# Patient Record
Sex: Female | Born: 1975
Health system: Southern US, Community
[De-identification: ages and names within clinical notes are randomized; demographics above are authoritative.]

## PROBLEM LIST (undated history)

## (undated) ENCOUNTER — Ambulatory Visit: Admission: EM | Discharge status: Absent without leave | Payer: BC Managed Care – PPO

## (undated) DIAGNOSIS — F329 Major depressive disorder, single episode, unspecified: Secondary | ICD-10-CM

## (undated) DIAGNOSIS — E063 Autoimmune thyroiditis: Secondary | ICD-10-CM

## (undated) DIAGNOSIS — R112 Nausea with vomiting, unspecified: Secondary | ICD-10-CM

## (undated) DIAGNOSIS — I493 Ventricular premature depolarization: Secondary | ICD-10-CM

## (undated) DIAGNOSIS — D649 Anemia, unspecified: Secondary | ICD-10-CM

## (undated) DIAGNOSIS — F32A Depression, unspecified: Secondary | ICD-10-CM

## (undated) DIAGNOSIS — F419 Anxiety disorder, unspecified: Secondary | ICD-10-CM

## (undated) DIAGNOSIS — K602 Anal fissure, unspecified: Secondary | ICD-10-CM

## (undated) DIAGNOSIS — E039 Hypothyroidism, unspecified: Secondary | ICD-10-CM

## (undated) DIAGNOSIS — Z9889 Other specified postprocedural states: Secondary | ICD-10-CM

## (undated) HISTORY — DX: Depression, unspecified: F32.A

## (undated) HISTORY — DX: Anal fissure, unspecified: K60.2

## (undated) HISTORY — PX: MUSCLE BIOPSY: SHX716

## (undated) HISTORY — DX: Anemia, unspecified: D64.9

## (undated) HISTORY — DX: Anxiety disorder, unspecified: F41.9

## (undated) HISTORY — PX: WISDOM TOOTH EXTRACTION: SHX21

## (undated) HISTORY — PX: OTHER SURGICAL HISTORY: SHX169

## (undated) HISTORY — DX: Major depressive disorder, single episode, unspecified: F32.9

## (undated) HISTORY — DX: Hypothyroidism, unspecified: E03.9

---

## 2005-04-29 HISTORY — PX: CERVICAL DISCECTOMY: SHX98

## 2005-05-02 ENCOUNTER — Ambulatory Visit: Payer: Self-pay | Admitting: Family Medicine

## 2005-06-05 ENCOUNTER — Encounter: Payer: Self-pay | Admitting: Family Medicine

## 2005-08-15 ENCOUNTER — Ambulatory Visit (HOSPITAL_COMMUNITY): Admission: RE | Admit: 2005-08-15 | Discharge: 2005-08-16 | Payer: Self-pay | Admitting: Neurosurgery

## 2006-11-18 ENCOUNTER — Encounter: Payer: Self-pay | Admitting: Family Medicine

## 2007-05-31 LAB — CONVERTED CEMR LAB: Pap Smear: NORMAL

## 2007-09-15 ENCOUNTER — Ambulatory Visit (HOSPITAL_COMMUNITY): Admission: RE | Admit: 2007-09-15 | Discharge: 2007-09-15 | Payer: Self-pay | Admitting: *Deleted

## 2008-03-08 ENCOUNTER — Ambulatory Visit: Payer: Self-pay | Admitting: Family Medicine

## 2008-03-08 DIAGNOSIS — R635 Abnormal weight gain: Secondary | ICD-10-CM

## 2008-03-08 DIAGNOSIS — K429 Umbilical hernia without obstruction or gangrene: Secondary | ICD-10-CM | POA: Insufficient documentation

## 2008-03-08 DIAGNOSIS — G729 Myopathy, unspecified: Secondary | ICD-10-CM

## 2008-03-08 DIAGNOSIS — K648 Other hemorrhoids: Secondary | ICD-10-CM | POA: Insufficient documentation

## 2008-03-08 DIAGNOSIS — I4949 Other premature depolarization: Secondary | ICD-10-CM

## 2008-03-11 DIAGNOSIS — D72819 Decreased white blood cell count, unspecified: Secondary | ICD-10-CM | POA: Insufficient documentation

## 2008-03-11 LAB — CONVERTED CEMR LAB
BUN: 13 mg/dL (ref 6–23)
Basophils Relative: 0.8 % (ref 0.0–3.0)
Bilirubin, Direct: 0.2 mg/dL (ref 0.0–0.3)
CO2: 26 meq/L (ref 19–32)
Calcium: 9.2 mg/dL (ref 8.4–10.5)
Cholesterol: 150 mg/dL (ref 0–200)
GFR calc non Af Amer: 88 mL/min
Glucose, Bld: 75 mg/dL (ref 70–99)
HDL: 37.7 mg/dL — ABNORMAL LOW (ref >39.0)
MCHC: 35.4 g/dL (ref 30.0–36.0)
Monocytes Absolute: 0.3 10*3/uL (ref 0.1–1.0)
Neutro Abs: 2.5 10*3/uL (ref 1.4–7.7)
Neutrophils Relative %: 62.8 % (ref 43.0–77.0)
Potassium: 4.3 meq/L (ref 3.5–5.1)
RDW: 11.6 % (ref 11.5–14.6)
TSH: 4.14 microintl units/mL (ref 0.35–5.50)
Total CHOL/HDL Ratio: 4
WBC: 3.9 10*3/uL — ABNORMAL LOW (ref 4.5–10.5)

## 2008-03-22 ENCOUNTER — Ambulatory Visit: Payer: Self-pay | Admitting: Cardiology

## 2008-03-25 ENCOUNTER — Ambulatory Visit: Payer: Self-pay | Admitting: Family Medicine

## 2008-03-25 DIAGNOSIS — J019 Acute sinusitis, unspecified: Secondary | ICD-10-CM

## 2008-03-30 ENCOUNTER — Telehealth: Payer: Self-pay | Admitting: Family Medicine

## 2008-03-31 ENCOUNTER — Ambulatory Visit: Payer: Self-pay | Admitting: Family Medicine

## 2008-09-22 ENCOUNTER — Ambulatory Visit: Payer: Self-pay | Admitting: Family Medicine

## 2008-09-22 DIAGNOSIS — R5383 Other fatigue: Secondary | ICD-10-CM

## 2008-09-22 DIAGNOSIS — R5381 Other malaise: Secondary | ICD-10-CM | POA: Insufficient documentation

## 2008-09-27 LAB — CONVERTED CEMR LAB
Basophils Absolute: 0 10*3/uL (ref 0.0–0.1)
Basophils Relative: 0.1 % (ref 0.0–3.0)
HCT: 36.3 % (ref 36.0–46.0)
Hemoglobin: 12.8 g/dL (ref 12.0–15.0)
Lymphocytes Relative: 32 % (ref 12.0–46.0)
MCHC: 35.3 g/dL (ref 30.0–36.0)
Platelets: 154 10*3/uL (ref 150.0–400.0)
RBC: 3.82 M/uL — ABNORMAL LOW (ref 3.87–5.11)
RDW: 11.3 % — ABNORMAL LOW (ref 11.5–14.6)
WBC: 4.2 10*3/uL — ABNORMAL LOW (ref 4.5–10.5)

## 2009-02-17 ENCOUNTER — Telehealth (INDEPENDENT_AMBULATORY_CARE_PROVIDER_SITE_OTHER): Payer: Self-pay | Admitting: *Deleted

## 2009-04-10 ENCOUNTER — Ambulatory Visit: Payer: Self-pay | Admitting: Family Medicine

## 2009-04-29 HISTORY — PX: GANGLION CYST EXCISION: SHX1691

## 2009-06-27 ENCOUNTER — Inpatient Hospital Stay: Payer: Self-pay

## 2009-09-14 ENCOUNTER — Emergency Department: Payer: Self-pay | Admitting: Emergency Medicine

## 2009-11-15 ENCOUNTER — Ambulatory Visit: Payer: Self-pay | Admitting: Orthopedic Surgery

## 2009-11-20 LAB — PATHOLOGY REPORT

## 2010-05-24 ENCOUNTER — Ambulatory Visit
Admission: RE | Admit: 2010-05-24 | Discharge: 2010-05-24 | Payer: Self-pay | Source: Home / Self Care | Attending: Internal Medicine | Admitting: Internal Medicine

## 2010-05-24 DIAGNOSIS — E669 Obesity, unspecified: Secondary | ICD-10-CM | POA: Insufficient documentation

## 2010-05-24 DIAGNOSIS — E663 Overweight: Secondary | ICD-10-CM

## 2010-05-24 DIAGNOSIS — E039 Hypothyroidism, unspecified: Secondary | ICD-10-CM | POA: Insufficient documentation

## 2010-05-30 ENCOUNTER — Encounter: Payer: Self-pay | Admitting: Family Medicine

## 2010-05-31 NOTE — Assessment & Plan Note (Signed)
Summary: PERSONAL / LFW   Vital Signs:  Patient profile:   35 year old female Weight:      156 pounds BMI:     26.87 Temp:     98.2 degrees F Pulse rate:   52 / minute BP sitting:   101 / 58  Nutrition Counseling: Patient's BMI is greater than 25 and therefore counseled on weight management options. CC: Personal   History of Present Illness: Wanted to see me--- I  treat her husband  had their 3 rd child---their first son in March Diagnosed with hypothyroidism after having hair loss, fatigue and other classic symptoms was seeing Dr Dario Guardian for this Felt better on the 50 micrograms dose but apparently wasn't euthyroid so he increased to 75 micrograms Seems that she doesn't feel as good weight has started creeping up again  appetite is very big Back to her regular exercise Tries to eat well Husband watching his eating also so they are working together  Weight 120# before back surgery in 2008 then crept up to 140# Now unable to get back anywhere near there and is frustrated  Allergies: 1)  ! * Corn 2)  ! * Seasonal 3)  ! * Peanuts 4)  ! * Eggs  Past History:  Past medical, surgical, family and social histories (including risk factors) reviewed for relevance to current acute and chronic problems.  Past Medical History: Hypothyroidism  Past Surgical History: Reviewed history from 03/08/2008 and no changes required. 2007 partial diskectomy muscle biopsy asa teenager: nonspecific NSVD x 2  Family History: Reviewed history from 03/08/2008 and no changes required. father: hepatitis C, HTN mother: healthy sister: hypothyroidism brother: Crohn's, asthma PGF: DM, leukemia 3 aunts/uncles, maternal: epilepsy uncle : liver cancer  Social History: Reviewed history from 03/08/2008 and no changes required. 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: healthy Never Smoked Alcohol use-yes, rarely Drug use-no Regular exercise-yes, cardio 2 days a week, 3  classes a week Diet: fruits and veggies, doesn't like meat much, likes soda and sweet tea  Review of Systems  The patient denies chest pain, syncope, and dyspnea on exertion.         Occ palpitations and has been told she has PVCs Had gotten pregnant on OCP, then miscarried Then pregnant again with their son Husband has since had vasectomy sleeps okay  Physical Exam  General:  alert and normal appearance.   Neck:  supple, no masses, no thyromegaly, no carotid bruits, and no cervical lymphadenopathy.   Lungs:  normal respiratory effort, no intercostal retractions, no accessory muscle use, and normal breath sounds.   Heart:  regular rhythm, no murmur, no gallop, and bradycardia.   Abdomen:  soft and non-tender.   Extremities:  no edema Psych:  normally interactive, good eye contact, not anxious appearing, and not depressed appearing.     Impression & Recommendations:  Problem # 1:  HYPOTHYROIDISM (ICD-244.9) Assessment New  post partum last year had been seeing Dr Dario Guardian Felt better on the 50 micrograms dose Appetite more and weight seems to be rising on the higher dose  P: will get records from Dr Dario Guardian     try back on the 50 micrograms dose  Her updated medication list for this problem includes:    Levothyroxine Sodium 50 Mcg Tabs (Levothyroxine sodium) .Marland Kitchen... 1 tab daily for underactive thyroid  Problem # 2:  OVERWEIGHT (ICD-278.02) Assessment: Deteriorated had been ideal body weight until 2008 Okay when she was 140 also Needs some help  so will try phentermine short term discussed potential side effects  Complete Medication List: 1)  Levothyroxine Sodium 50 Mcg Tabs (Levothyroxine sodium) .Marland Kitchen.. 1 tab daily for underactive thyroid 2)  Phentermine Hcl 37.5 Mg Caps (Phentermine hcl) .Marland Kitchen.. 1 tab daily to suppress appetite  Patient Instructions: 1)  Please get records from Dr Dario Guardian 2)  Please schedule a follow-up appointment in 2 months.  Prescriptions: PHENTERMINE  HCL 37.5 MG CAPS (PHENTERMINE HCL) 1 tab daily to suppress appetite  #30 x 2   Entered and Authorized by:   Cindee Salt MD   Signed by:   Cindee Salt MD on 05/24/2010   Method used:   Print then Give to Patient   RxID:   [id number] LEVOTHYROXINE SODIUM 50 MCG TABS (LEVOTHYROXINE SODIUM) 1 tab daily for underactive thyroid  #30 x 11   Entered and Authorized by:   Cindee Salt MD   Signed by:   Cindee Salt MD on 05/24/2010   Method used:   Electronically to        [facility] (retail)       [address]       [place], [place]  [id number]       Ph: [phone]       Fax: [phone]   RxID:   [id number]    Orders Added: 1)  Est. Patient Level IV [87564]

## 2010-06-04 ENCOUNTER — Encounter: Payer: Self-pay | Admitting: Family Medicine

## 2010-06-04 ENCOUNTER — Ambulatory Visit (INDEPENDENT_AMBULATORY_CARE_PROVIDER_SITE_OTHER): Payer: Self-pay | Admitting: Family Medicine

## 2010-06-04 DIAGNOSIS — J019 Acute sinusitis, unspecified: Secondary | ICD-10-CM

## 2010-06-04 DIAGNOSIS — J309 Allergic rhinitis, unspecified: Secondary | ICD-10-CM

## 2010-06-04 DIAGNOSIS — J157 Pneumonia due to Mycoplasma pneumoniae: Secondary | ICD-10-CM

## 2010-06-04 DIAGNOSIS — E860 Dehydration: Secondary | ICD-10-CM

## 2010-06-06 ENCOUNTER — Ambulatory Visit (INDEPENDENT_AMBULATORY_CARE_PROVIDER_SITE_OTHER): Payer: BC Managed Care – PPO | Admitting: Internal Medicine

## 2010-06-06 ENCOUNTER — Ambulatory Visit: Payer: Self-pay | Admitting: Internal Medicine

## 2010-06-06 ENCOUNTER — Encounter: Payer: Self-pay | Admitting: Internal Medicine

## 2010-06-06 DIAGNOSIS — J111 Influenza due to unidentified influenza virus with other respiratory manifestations: Secondary | ICD-10-CM | POA: Insufficient documentation

## 2010-06-14 NOTE — Letter (Signed)
Summary: Work excuse letter  Work excuse letter   Imported By: Dorna Leitz 06/05/2010 19:20:55  _____________________________________________________________________  External Attachment:    Type:   Image     Comment:   External Document

## 2010-06-14 NOTE — Assessment & Plan Note (Signed)
Summary: SICK/EVM   Vital Signs:  Patient Profile:   35 Years Old Female CC:      cough, chills Height:     64 inches (162.56 cm) O2 Sat:      98 % O2 treatment:    Room Air Temp:     99.6 degrees F oral Pulse rate:   102 / minute Pulse rhythm:   regular Resp:     14 per minute BP sitting:   119 / 78  (right arm)  Pt. in pain?   no                   Current Allergies: ! * CORN ! * SEASONAL ! * PEANUTS ! * EGGSHistory of Present Illness History from: patient Reason for visit: see chief complaint Chief Complaint: cough, chills History of Present Illness: The Patient Is Presenting Today because she has been having 3-4 days of cough congestion and headache. She's had a dry hacking cough. She says that she has had fatigue and headache. She is coughing up a green yellow mucus from the chest. She reports that she recently traveled to [place] and was on 4 different airline flights that were packed with people that were coughing and hacking. The patient reports that she is now having fever and chills and cough. She reports that she's been sleeping throughout the day and not drinking or eating well. She reports that she's having no diarrhea or vomiting but has been nauseated. The patient reports that she has no abdominal pain but stomach upset is appropriate to call her symptoms. The patient reports that she was not able to work today because she was so tired and feeling so bad that she called in sick. She's requesting a note for today.  REVIEW OF SYSTEMS Constitutional Symptoms       Complains of fever, chills, and fatigue.     Denies night sweats, weight loss, and weight gain.  Eyes       Denies change in vision, eye pain, eye discharge, glasses, contact lenses, and eye surgery. Ear/Nose/Throat/Mouth       Complains of frequent runny nose, sinus problems, sore throat, and tooth pain or bleeding.      Denies hearing loss/aids, change in hearing, ear pain, ear discharge, dizziness,  frequent nose bleeds, and hoarseness.  Respiratory       Complains of dry cough.      Denies productive cough, wheezing, shortness of breath, asthma, bronchitis, and emphysema/COPD.  Cardiovascular       Denies murmurs, chest pain, and tires easily with exhertion.    Gastrointestinal       Complains of nausea/vomiting.      Denies stomach pain, diarrhea, constipation, blood in bowel movements, and indigestion. Genitourniary       Denies painful urination, kidney stones, and loss of urinary control. Neurological       Complains of headaches.      Denies paralysis, seizures, and fainting/blackouts. Musculoskeletal       Complains of muscle pain and joint pain.      Denies joint stiffness, decreased range of motion, redness, swelling, muscle weakness, and gout.  Skin       Denies bruising, unusual mles/lumps or sores, and hair/skin or nail changes.  Psych       Denies mood changes, temper/anger issues, anxiety/stress, speech problems, depression, and sleep problems.  Past History:  Past Surgical History: Reviewed history from 03/08/2008 and no changes required. 2007 partial diskectomy  muscle biopsy asa teenager: nonspecific NSVD x 2  Family History: Reviewed history from 03/08/2008 and no changes required. father: hepatitis C, HTN mother: healthy sister: hypothyroidism brother: Crohn's, asthma PGF: DM, leukemia 3 aunts/uncles, maternal: epilepsy uncle : liver cancer  Social History: Reviewed history from 05/24/2010 and no changes required. 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: healthy Never Smoked Alcohol use-yes, rarely Drug use-no Regular exercise-yes, cardio 2 days a week, 3 classes a week Diet: fruits and veggies, doesn't like meat much, likes soda and sweet tea Physical Exam General appearance: well developed, well nourished, no acute distress Head: normocephalic, atraumatic Eyes: conjunctivae and lids normal Pupils: equal, round, reactive to  light Ears: normal, no lesions or deformities Nasal: swollen nasal turbinates and marked sinus congestion and postnasal drainage Oral/Pharynx: dry mucous membranes, posterior pharynx erythematous and beefy red Neck: neck supple,  no lymphadenopathy, trachea midline, no masses Thyroid: no nodules, masses, tenderness, or enlargement Chest/Lungs: upper anterior airway congestion noises, wheezes, or rhonchi bilateral, breath sounds equal without effort Heart: regular rate and  rhythm, no murmur Abdomen: soft, non-tender without obvious organomegaly Extremities: normal extremities Neurological: grossly intact and non-focal Skin: no obvious rashes or lesions MSE: oriented to time, place, and person Assessment New Problems: DEHYDRATION (ICD-276.51) ? of WALKING PNEUMONIA (ICD-483.0)   Patient Education: Patient and/or caregiver instructed in the following: rest, fluids, Tylenol prn, Ibuprofen prn. The risks, benefits and possible side effects were clearly explained and discussed with the patient.  The patient verbalized clear understanding.  The patient was given instructions to return if symptoms don't improve, worsen or new changes develop.  If it is not during clinic hours and the patient cannot get back to this clinic then the patient was told to seek medical care at an available urgent care or emergency department.  The patient verbalized understanding.   Demonstrates willingness to comply.  Plan New Medications/Changes: LORATADINE 10 MG TABS (LORATADINE) take 1 by mouth daily to dry secretions  #20 x 0, 06/04/2010, Clanford Johnson MD FLUTICASONE PROPIONATE 50 MCG/ACT SUSP (FLUTICASONE PROPIONATE) 2 sprays per nostril once daily  #1 x 0, 06/04/2010, Clanford Johnson MD AZITHROMYCIN 250 MG TABS (AZITHROMYCIN) take 2 by mouth on day 1, then 1 by mouth daily until completed  #6 x 0, 06/04/2010, Clanford Johnson MD IBUPROFEN 600 MG TABS (IBUPROFEN) take 1 by mouth every 8 hours with food as  needed sore throat, body aches, fever  #12 x 0, 06/04/2010, Clanford Johnson MD  Follow Up: Follow up in 2-3 days if no improvement, Follow up on an as needed basis, Follow up with Primary Physician  The patient and/or caregiver has been counseled thoroughly with regard to medications prescribed including dosage, schedule, interactions, rationale for use, and possible side effects and they verbalize understanding.  Diagnoses and expected course of recovery discussed and will return if not improved as expected or if the condition worsens. Patient and/or caregiver verbalized understanding.  Prescriptions: LORATADINE 10 MG TABS (LORATADINE) take 1 by mouth daily to dry secretions  #20 x 0   Entered and Authorized by:   Standley Dakins MD   Signed by:   Standley Dakins MD on 06/04/2010   Method used:   Electronically to        [facility] (retail)       [address]       [place], [place]  [id number]       Ph: [phone]  Fax: [phone]   RxID:   [id number] FLUTICASONE PROPIONATE 50 MCG/ACT SUSP (FLUTICASONE PROPIONATE) 2 sprays per nostril once daily  #1 x 0   Entered and Authorized by:   Standley Dakins MD   Signed by:   Standley Dakins MD on 06/04/2010   Method used:   Electronically to        [facility] (retail)       [address]       [place], [place]  [id number]       Ph: [phone]       Fax: [phone]   RxID:   [id number] AZITHROMYCIN 250 MG TABS (AZITHROMYCIN) take 2 by mouth on day 1, then 1 by mouth daily until completed  #6 x 0   Entered and Authorized by:   Standley Dakins MD   Signed by:   Standley Dakins MD on 06/04/2010   Method used:   Electronically to        [facility] (retail)       [address]       [place], [place]  [id number]       Ph: [phone]       Fax: [phone]   RxID:   [id number] IBUPROFEN 600 MG  TABS (IBUPROFEN) take 1 by mouth every 8 hours with food as needed sore throat, body aches, fever  #12 x 0   Entered and Authorized by:   Standley Dakins MD   Signed by:   Standley Dakins MD on 06/04/2010   Method used:   Electronically to        [facility] (retail)       [address]       [place], [place]  [id number]       Ph: [phone]       Fax: [phone]   RxID:   [id number]   Patient Instructions: 1)  Go to the pharmacy and pick up your prescription (s).  It may take up to 30 mins for electronic prescriptions to be delivered to the pharmacy.  Please call if your pharmacy has not received your prescriptions after 30 minutes.   2)  Oral Rehydration Solution: drink 1/2 ounce every 15 minutes. If tolerated afert 1 hour, drink 1 ounce every 15 minutes. As you can tolerate, keep adding 1/2 ounce every 15 minutes, up to a total of 2-4 ounces. Contact the office if unable to tolerate oral solution, if you keep vomiting, or you continue to have signs of dehydration. 3)  Take your antibiotic as prescribed until ALL of it is gone, but stop if you develop a rash or swelling and contact our office as soon as possible. 4)  Acute sinusitis symptoms for less than 10 days are not helped by antibiotics.Use warm moist compresses, and over the counter decongestants ( only as directed). Call if no improvement in 5-7 days, sooner if increasing pain, fever, or new symptoms. 5)  Take 650-1000mg  of Tylenol every 4-6 hours as needed for relief of pain or comfort of fever AVOID taking more than 4000mg   in a 24 hour period (can cause liver damage in higher doses). 6)  Take 400-600mg  of Ibuprofen (Advil, Motrin) with food every 8 hours as needed for relief of pain or comfort of fever. 7)  Return or go to the ER if no improvement  or symptoms getting worse.   8)  The patient was informed that there is no on-call provider or services available at this clinic during  off-hours (when the clinic is closed).  If the patient developed a problem or concern that required immediate attention, the patient was advised to go the the nearest available urgent care or emergency department for medical care.  The patient verbalized understanding.

## 2010-06-14 NOTE — Letter (Signed)
Summary: Out of Work  [facility]  [address]   [place], [place] [address]   Phone: [phone]  Fax: [phone]    June 04, 2010   Employee:  Sara Howell    To Whom It May Concern:   For Medical reasons, please excuse the above named employee from work for the following dates:  Start:   June 04, 2010  End:   June 06, 2010  If you need additional information, please feel free to contact our office.         Sincerely,    Standley Dakins MD

## 2010-06-14 NOTE — Letter (Signed)
Summary: Out of Work  [facility]  [address] [place], [place] [address]   Phone: [phone]  Fax: [phone]    June 06, 2010   Employee:  Eyleen A Hundal    To Whom It May Concern:   For Medical reasons, please excuse the above named employee from work for the following dates:  Start:   June 06, 2010  End:   June 11, 2010 --returning back to work  If you need additional information, please feel free to contact our office.         Sincerely,       Tillman Abide, MD

## 2010-06-14 NOTE — Assessment & Plan Note (Signed)
Vital Signs:  Patient profile:   35 year old female Weight:      149 pounds O2 Sat:      99 % on Room air Temp:     100.6 degrees F oral Pulse rate:   104 / minute Pulse rhythm:   regular Resp:     14 per minute BP sitting:   112 / 61  (left arm) Cuff size:   regular  Vitals Entered By: Mervin Hack CMA Duncan Dull) (June 06, 2010 12:33 PM)  O2 Flow:  Room air CC: not feeling well   History of Present Illness: Was out of town --back from [place] 4 days ago Started with congestion in chest and cough "I just feel like everything hurts" feels pressure in chest SOme nasty phlegm--just a little. Lots of cough--mostly dry though Mild SOB  Briefly okay then couldn't go to work Lying around since then Fever Sweats last night, some chills as well Bad headaches also  No flu shot due to egg allergy  Has been using ibuprofen  Allergies: 1)  ! * Corn 2)  ! * Seasonal 3)  ! * Peanuts 4)  ! * Eggs  Past History:  Past medical, surgical, family and social histories (including risk factors) reviewed for relevance to current acute and chronic problems.  Past Medical History: Reviewed history from 05/24/2010 and no changes required. Hypothyroidism  Past Surgical History: Reviewed history from 03/08/2008 and no changes required. 2007 partial diskectomy muscle biopsy asa teenager: nonspecific NSVD x 2  Family History: Reviewed history from 03/08/2008 and no changes required. father: hepatitis C, HTN mother: healthy sister: hypothyroidism brother: Crohn's, asthma PGF: DM, leukemia 3 aunts/uncles, maternal: epilepsy uncle : liver cancer  Social History: Reviewed history from 05/24/2010 and no changes required. 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  Appears washed out but no distress Head:  no sinus tenderness Ears:  R ear normal and L ear normal.   Nose:  moderate congestion  Mouth:  mild injection without exudates Neck:  supple, no masses, and no cervical lymphadenopathy.   Lungs:  normal respiratory effort, no intercostal retractions, no accessory muscle use, normal breath sounds, no crackles, and no wheezes.   Abdomen:  soft and non-tender.     Impression & Recommendations:  Problem # 1:  INFLUENZA WITH OTHER RESPIRATORY MANIFESTATIONS (ICD-487.1) Assessment New clearly seems to have the flu no tachynea or lung findings to cause concern for pneumonia should finish out azithromycin ibuprofen, fluids, rest Reviewed Dr Henriette Combs notes   may go back to work on the 13th--next monday  Complete Medication List: 1)  Levothyroxine Sodium 50 Mcg Tabs (Levothyroxine sodium) .Marland Kitchen.. 1 tab daily for underactive thyroid 2)  Phentermine Hcl 37.5 Mg Caps (Phentermine hcl) .Marland Kitchen.. 1 tab daily to suppress appetite 3)  Ibuprofen 600 Mg Tabs (Ibuprofen) .... Take 1 by mouth every 8 hours with food as needed sore throat, body aches, fever 4)  Azithromycin 250 Mg Tabs (Azithromycin) .... Take 2 by mouth on day 1, then  1 by mouth daily until completed 5)  Fluticasone Propionate 50 Mcg/act Susp (Fluticasone propionate) .... 2 sprays per nostril once daily 6)  Loratadine 10 Mg Tabs (Loratadine) .... Take 1 by mouth daily to dry secretions  Patient Instructions: 1)  Keep regular follow up   Orders Added: 1)  Est. Patient Level IV [11914]    Current Allergies (reviewed today): ! * CORN ! * SEASONAL ! * PEANUTS ! * EGGS

## 2010-07-17 ENCOUNTER — Ambulatory Visit: Payer: BC Managed Care – PPO | Admitting: Internal Medicine

## 2010-07-30 ENCOUNTER — Encounter: Payer: Self-pay | Admitting: Internal Medicine

## 2010-07-30 ENCOUNTER — Ambulatory Visit (INDEPENDENT_AMBULATORY_CARE_PROVIDER_SITE_OTHER): Payer: BC Managed Care – PPO | Admitting: Internal Medicine

## 2010-07-30 VITALS — BP 112/70 | HR 72 | Temp 98.1°F | Ht 65.0 in | Wt 144.4 lb

## 2010-07-30 DIAGNOSIS — E663 Overweight: Secondary | ICD-10-CM

## 2010-07-30 DIAGNOSIS — E039 Hypothyroidism, unspecified: Secondary | ICD-10-CM

## 2010-07-30 LAB — TSH: TSH: 2.33 u[IU]/mL (ref 0.35–5.50)

## 2010-07-30 LAB — T4, FREE: Free T4: 0.71 ng/dL (ref 0.60–1.60)

## 2010-07-30 MED ORDER — PHENTERMINE HCL 37.5 MG PO TABS: 37.5000 mg | ORAL_TABLET | Freq: Every day | ORAL | Status: DC

## 2010-07-30 NOTE — Progress Notes (Signed)
  Subjective:    Patient ID: Sara Howell, female    DOB: 12/06/1975, 35 y.o.   MRN: [id number]  HPI Seen in [facility] a while ago ?walking pneumonia Feels better now  Weight is down 12# Feels the phentermine controls her cravings and appetite Has been exercising regularly  Is on the lower dose of the thyroid med Still some degree of tiredness despite the change---interrmittent  Past Medical History  Diagnosis Date  . Hypothyroidism     Past Surgical History  Procedure Date  . Cervical discectomy 2007    No specified location  . Muscle biopsy as a teenager    non specific  . Nsvd     x 2    Family History  Problem Relation Age of Onset  . Hepatitis Father     Hep C  . Hypertension Father   . Hypothyroidism Sister   . Crohn's disease Brother   . Asthma Brother   . Diabetes Paternal Grandfather   . Leukemia Paternal Grandfather   . Seizures Maternal Aunt     Epilepsy  . Seizures Maternal Uncle     Epilepsy  . Cancer Other     Liver    History   Social History  . Marital Status: Married    Spouse Name: N/A    Number of Children: 3  . Years of Education: N/A   Occupational History  . Sales    Social History Main Topics  . Smoking status: Never Smoker   . Smokeless tobacco: Not on file  . Alcohol Use: Yes     Rarely  . Drug Use: No  . Sexually Active: Not on file   Other Topics Concern  . Not on file   Social History Narrative  . No narrative on file      Review of Systems Sleeps okay No palpitations now--did have slight feeling of nervousness at first but takes the phentermine 1/2 with breakfast and lunch Has dry mouth     Objective:   Physical Exam  Constitutional: She appears well-developed and well-nourished. No distress.  Psychiatric: She has a normal mood and affect. Her behavior is normal. Judgment and thought content normal.          Assessment & Plan:

## 2010-09-11 NOTE — Assessment & Plan Note (Signed)
[facility]                            [place] OFFICE NOTE   Sara Howell, Sara Howell                       MRN:          [id number]  DATE:03/22/2008                            DOB:          1976-01-16    PRIMARY CARE PHYSICIAN:  Kerby Nora, MD   HISTORY OF PRESENT ILLNESS:  This is a 35 year old with a history of a  metabolic myopathy as well as PVCs first noted in 2001 who presents for  followup of her palpitations.  The patient states that since 2001 she  has been getting palpitations.  She initially was noting them mainly at  rest.  She did have a workup a couple of years ago when she had a Holter  monitor and PVCs were noted on the Holter monitor.  There were no other  arrhythmias.  Since that time, she states that she has begun to notice  the palpitations more when she exercises.  She is quite active.  She  does kick boxing, weight training, and is in spin class, and when she  gets her heart rate up, she says that she begins to notice  irregularities and occasional pauses in her heart rate.  The patient  also over the last couple of years has been diagnosed with a metabolic  myopathy.  She sees a neurologist for this.  She had a muscle biopsy as  a child, the results are not available.  She has refused a repeat muscle  biopsy but workup including lab work and an EMG has suggested the  presence of a metabolic myopathy.  The patient does not have a whole lot  of side effects from this.  She says that she does get muscle fatigue  fairly easily; however, she, as mentioned above, still is quite active.  She does not get any abnormal shortness of breath with exertion.  She  does not have chest pain, lightheadedness, or syncope.   PAST MEDICAL HISTORY:  1. Metabolic myopathy.  The patient does see a neurologist for this.      She has refused to repeat muscle biopsy.  This has been diagnosed      by lab work and EMG.  She says that she has been put on  a number of      vitamins including L-carnitine.  2. History of diskectomy for radiculopathy.  3. History of PVCs first diagnosed in 2001.  4. History of migraines.   SOCIAL HISTORY:  The patient works in Airline [employer].  She has 2 children.  She  is married.  She lives in [place].  Drinks alcohol rarely.  She does  not smoke.  Does not use any illicit drugs.  She drinks less than 1  caffeinated drink a day.  She is not using over-the-counter cold  medications.   FAMILY HISTORY:  The patient's father had hepatitis C and hypertension  and the patient's mother is healthy, her sister has hyperthyroidism.  She has a brother with Crohn's disease.  She has an aunt with mitral  valve prolapse.  No premature coronary disease.  No history of the  metabolic myopathy in other members of family.   LABORATORY DATA:  Most recent labs were in November 2009.  HDL 38, LDL  100, triglycerides 63, and creatinine 0.8.   MEDICATIONS:  Oral contraceptive pills and L-carnitine as well as some  other vitamins.   PHYSICAL EXAMINATION:  VITAL SIGNS:  Blood pressure is 104/64, heart  rate is 56 and regular, and weight is 138 pounds.  GENERAL:  This is a well-developed female in no apparent distress.  NEUROLOGIC:  Alert and oriented x3.  Normal affect.  NECK:  There is no thyromegaly or thyroid nodule.  There is no JVD.  LUNGS:  Clear to auscultation bilaterally.  CARDIOVASCULAR:  Heart regular.  S1 and S2.  No S3.  No S4.  No murmur.  There is no peripheral edema.  There are 2+ posterior tibial pulses  bilaterally.  There are normal carotid upstrokes.  There is carotid  bruit.  ABDOMEN:  Soft and nontender.  No hepatosplenomegaly.  Normal bowel  sounds.  EXTREMITIES:  No clubbing or cyanosis.  SKIN:  Normal exam.  MUSCULOSKELETAL:  Normal exam.  HEENT: Normal exam.   EKG was reviewed today.  There was normal sinus rhythm.  It was a normal  EKG.   ASSESSMENT AND PLAN:  This is a 35 year old with a history  of metabolic  myopathy as well as frequent premature ventricular contractions presents  to Cardiology Clinic for evaluation.  1. Palpitations.  The patient does have palpitations.  She notes them      especially with exercise.  She was diagnosed with premature      ventricular contractions by a Holter monitor back in 2001.  We will      go ahead and set her up to get a repeat 48-hour Holter monitor to      look for frequency of premature ventricular contractions and to      look for any evidence of more worrisome arrhythmias.  I did tell      her to go ahead and be active over the 2 days when she wears the      monitor so that she can reproduce the conditions that tend to bring      on her palpitations.  2. Metabolic myopathy.  The patient does report a history of an      undifferentiated metabolic myopathy.  She is followed by a      neurologist in [place].  Her main symptoms tend to be muscle      fatigue.  She is quite active and does not get abnormally short of      breath with exertion.  Metabolic myopathies do have the potential      to affect the myocardium.  I do think it is reasonable to obtain an      echocardiogram to assess for any evidence of a cardiomyopathy.  We      will go ahead and get this done in the next week or so.  I will      then have her follow back in a month.  We will go over the results      of her Holter monitor and her echo.     Marca Ancona, MD  Electronically Signed    DM/MedQ  DD: 03/22/2008  DT: 03/23/2008  Job #: [id number]   cc:   Kerby Nora, MD

## 2010-09-11 NOTE — Op Note (Signed)
Sara Howell, Sara Howell                ACCOUNT NO.:  [id number]   MEDICAL RECORD NO.:  [id number]          PATIENT TYPE:  AMB   LOCATION:  ENDO                         FACILITY:  [facility]   PHYSICIAN:  Georgiana Spinner, M.D.    DATE OF BIRTH:  1975-05-30   DATE OF PROCEDURE:  DATE OF DISCHARGE:                               OPERATIVE REPORT   PROCEDURE:  Flexible sigmoidoscopy.   INDICATIONS:  Rectal bleeding.   ANESTHESIA:  Fentanyl 100 mcg, Versed 8 mg.   PROCEDURE:  With the patient mildly sedated in the left lateral  decubitus position, rectal examination was performed.  The perineum  appeared normal, except for skin tags.  The Pentax videoscopic endoscope  was inserted into the rectum and passed a short distance where we  encountered solid yellow stool, so I elected to go no further, but what  I saw of the mucosa appeared normal.  The endoscope was placed in  retroflexion to view the anal canal from above, and this was be  visualized and showed hemorrhoids.  We  straightened and withdrew the  scope through the anal canal.  I could did not see a fissure per se.  The area appeared to be somewhat friable, and it was certainly tender on  examination.  The endoscope was withdrawn.  The patient's vital signs  and pulse oximeter remained stable.  The patient tolerated the procedure  well without apparent complications.   FINDINGS:  Internal hemorrhoids, otherwise a negative exam.  I could not  well see a fissure.  The prep was slightly suboptimal, and there was  some solid stool that seemed to cover the distal rectum.   PLAN:  Will treat with Cardizem cream and await clinical response.  Have  patient follow up with me as an outpatient.           ______________________________  Georgiana Spinner, M.D.     GMO/MEDQ  D:  09/15/2007  T:  09/15/2007  Job:  [id number]

## 2010-09-14 NOTE — Op Note (Signed)
Sara Howell, Sara Howell                ACCOUNT NO.:  [id number]   MEDICAL RECORD NO.:  [id number]          PATIENT TYPE:  AMB   LOCATION:  SDS                          FACILITY:  [facility]   PHYSICIAN:  Reinaldo Meeker, M.D. DATE OF BIRTH:  12/08/1975   DATE OF PROCEDURE:  08/15/2005  DATE OF DISCHARGE:                                 OPERATIVE REPORT   PREOPERATIVE DIAGNOSIS:  Herniated disk, L4-5, central and right.   POSTOPERATIVE DIAGNOSIS:  Herniated disk, L4-5, central and right.   PROCEDURE:  Right L4-5 interlaminar laminotomy with excision of herniated  disk with operating microscope.   SECONDARY PROCEDURE:  Microdissection, L4-5 disk, and L5 nerve root.   SURGEON:  Reinaldo Meeker, M.D.   ASSISTANT:  Kathaleen Maser. Pool, M.D.   PROCEDURE IN DETAIL:  After being placed in the prone position, the  patient's back was prepped and draped in the usual sterile fashion.  A  localizing x-ray was taken prior to incision to identify the appropriate  level.  A midline incision was made above the spinous processes of L4-5.  Using the Bovie and _________ curette, the incision was carried down to the  spinous processes.  Subperiosteal dissection was then carried out on the  right side of the spinous process of the lamina.  A self-retaining retractor  was placed for exposure.  X-ray showed we approached the appropriate level.  Using the high speed drill, the inferior one-third of the L4 lamina, the  medial one-third of the facet joint removed.  It was then used to remove the  superior one-third of the L5 lamina.  Residual bone and ligamentum flavum  removed in a piecemeal fashion.  The microscope is draped and brought into  the field and used for the remainder of the case.  Using a microdissection  technique, the lateral aspect of the thecal sac and L5 nerve were  identified.  Further coagulation was carried down to the floor of the canal  to identify the L4-5 disk, which was found to be  moderately herniated under  the nerve root and more markedly herniated immediately.  After coagulating  on the annulus, the annulus  was incised with a 15 blade.  Using pituitary  rongeurs and curettes, thorough disk space clean-out was carried out.  Inspection was carried out inferior to the disk space, where some  subligamentous disk was identified and removed.  This gave excellent  decompression of the thecal sac and medial aspect of the nerve root.  At  this time, inspection was carried out in all directions without any evidence  of residual compression, and none could be identified.  Large amounts of  irrigation were carried out.  Any bleeding was controlled by coagulation and  Gelfoam.  The wound was then closed with multiple layers of Vicryl on the  muscle, fascia, subcutaneous, and subcuticular tissues, and staples were  placed on the skin.  A sterile dressing was then applied, and the patient  was extubated and taken to the recovery room in stable condition.  ______________________________  Reinaldo Meeker, M.D.     ROK/MEDQ  D:  08/15/2005  T:  08/15/2005  Job:  [id number]

## 2010-10-03 ENCOUNTER — Telehealth: Payer: Self-pay | Admitting: *Deleted

## 2010-10-03 ENCOUNTER — Encounter: Payer: Self-pay | Admitting: Family Medicine

## 2010-10-03 ENCOUNTER — Ambulatory Visit (INDEPENDENT_AMBULATORY_CARE_PROVIDER_SITE_OTHER): Payer: BC Managed Care – PPO | Admitting: Family Medicine

## 2010-10-03 VITALS — BP 104/60 | HR 88 | Temp 98.1°F | Wt 138.0 lb

## 2010-10-03 DIAGNOSIS — J329 Chronic sinusitis, unspecified: Secondary | ICD-10-CM

## 2010-10-03 DIAGNOSIS — E663 Overweight: Secondary | ICD-10-CM

## 2010-10-03 MED ORDER — PHENTERMINE HCL 30 MG PO CAPS: ORAL_CAPSULE | ORAL | Status: DC

## 2010-10-03 MED ORDER — AMOXICILLIN 875 MG PO TABS: 875.0000 mg | ORAL_TABLET | Freq: Two times a day (BID) | ORAL | Status: AC

## 2010-10-03 MED ORDER — PHENTERMINE HCL 37.5 MG PO TABS: 37.5000 mg | ORAL_TABLET | Freq: Every day | ORAL | Status: DC

## 2010-10-03 MED ORDER — PHENTERMINE HCL 30 MG PO CAPS: 15.0000 mg | ORAL_CAPSULE | Freq: Two times a day (BID) | ORAL | Status: DC

## 2010-10-03 NOTE — Patient Instructions (Signed)
Use nasal saline several times a day, use the flonase once a day, and start the amoxil this weekend if not better.  Take care.  I'll send Dr. Alphonsus Sias a note about your phentermine.

## 2010-10-03 NOTE — Assessment & Plan Note (Signed)
Restart flonase, use neti pot and start abx if not improved.  Nontoxic.  D/w pt and she understood.

## 2010-10-03 NOTE — Telephone Encounter (Signed)
Since the patient had been breaking the med in half, this was sent to approximate her current dose.  I will defer to Dr. Alphonsus Sias for further rxs. Will forward to PMD.

## 2010-10-03 NOTE — Assessment & Plan Note (Signed)
I wrote for a one month supply and sent message to PMD about fu on the future rxs.

## 2010-10-03 NOTE — Telephone Encounter (Signed)
Phenermine that was sent to pharmacy today changed to 37.5 mg tablets, to take one half twice a day.  # 30 with no refills.  Phentermine 30 mg's only comes in capsule form.

## 2010-10-03 NOTE — Progress Notes (Signed)
Duration of symptoms:3-4 days Rhinorrhea:yes Congestion:yes Ear pain:R ear pain today Sore throat:yes Cough: a little Myalgias: no Other concerns: R>L facial pain, discolored pale yellow/red streaked nasal discharge Fevers:no Sick contacts:  Toddler with runny nose recently.    She lost her phentermine rx and needs a replacement.  Had been doing well on 1/2 tab a day.   ROS: See HPI.  Otherwise negative.    Meds, vitals, and allergies reviewed.   GEN: nad, alert and oriented, nontoxic HEENT: mucous membranes moist, TM w/o erythema, nasal epithelium injected, OP with cobblestoning, R frontal and max sinus ttp, poor movement on TM with valsalva NECK: supple w/o LA CV: rrr. PULM: ctab, no inc wob ABD: soft, +bs EXT: no edema

## 2010-10-04 NOTE — Telephone Encounter (Signed)
She is now my patient Please call her to confirm what is going on I believe we were going to continue the phentermine till her follow up in July or August

## 2010-10-04 NOTE — Telephone Encounter (Signed)
See 1/26 OV note.the patient has changes to Dr. Alphonsus Sias since her husband sees him. I do not prescribe phentermine, I do not know what Dr. Cain Saupe plan regarding this med is. I will let him refill it upon his return. Please change PCP.

## 2010-10-04 NOTE — Telephone Encounter (Signed)
Dr.Bedsole's patient

## 2010-10-05 NOTE — Telephone Encounter (Signed)
This is a controlled substance and I will not give her refills anymore Have the pharmacy contact me monthly when she runs out --I will fill it till our follow up later in the summer

## 2010-10-05 NOTE — Telephone Encounter (Signed)
Spoke with patient and when she was in to see Dr.Letvak in April he gave her a rx for phentermine and per pt she lost it, pt was in on Wednesday to see Dr.Duncan and he gave her a 30day rx and per pt she would like the remaining if possible. Please advise.  ** phentermine in chart has been updated.

## 2010-10-08 NOTE — Telephone Encounter (Signed)
Spoke with patient and advised results   

## 2010-10-16 ENCOUNTER — Encounter: Payer: Self-pay | Admitting: Cardiovascular Disease

## 2010-10-29 ENCOUNTER — Other Ambulatory Visit: Payer: Self-pay | Admitting: *Deleted

## 2010-10-29 NOTE — Telephone Encounter (Signed)
Okay to fill #30 x 0 I will not give refills because she lost her prescription Clarify the appts---she doesn't need both

## 2010-10-29 NOTE — Telephone Encounter (Signed)
Left message on machine, asking patient her pharmacy, and the reason why we only can fill for 1 month, also asked her to let us know which provider she will be seeing.

## 2010-10-29 NOTE — Telephone Encounter (Signed)
Yes I did speak with her, I remember asking pt about her appointments,  was she going to see Dr.Letvak or Dr.Bedsole? She is scheduled 12/11/10 with Bedsole and 02/04/11 with Letvak.

## 2010-10-29 NOTE — Telephone Encounter (Signed)
Patient says that the rx that she lost that was given to her at her visit in April was for 3 months. Then when she saw Dr. Para March on 6-6 he gave a script for 1 month. She is asking if she can get script for the remaining 2 months. He also says that when she called on 6-8 that she never heard back from any one, but according to the phone note, Geraldine Contras did speak with her.

## 2010-10-30 MED ORDER — PHENTERMINE HCL 37.5 MG PO CAPS: 37.5000 mg | ORAL_CAPSULE | ORAL | Status: DC

## 2010-10-30 NOTE — Telephone Encounter (Signed)
Spoke with patient and she will pick up rx at front desk, per patient she is Dr.Letvak's patient. I changed PCP in her chart. rx on your desk to be signed.

## 2010-11-19 ENCOUNTER — Encounter: Payer: BC Managed Care – PPO | Admitting: Internal Medicine

## 2010-11-22 ENCOUNTER — Ambulatory Visit: Payer: BC Managed Care – PPO

## 2010-12-04 ENCOUNTER — Other Ambulatory Visit: Payer: BC Managed Care – PPO

## 2010-12-11 ENCOUNTER — Encounter: Payer: BC Managed Care – PPO | Admitting: Family Medicine

## 2010-12-24 ENCOUNTER — Other Ambulatory Visit: Payer: Self-pay | Admitting: *Deleted

## 2010-12-24 MED ORDER — PHENTERMINE HCL 37.5 MG PO CAPS: 37.5000 mg | ORAL_CAPSULE | ORAL | Status: DC

## 2010-12-24 NOTE — Telephone Encounter (Signed)
Left message on machine that rx is ready for pick-up, and it will be at our front desk.  

## 2011-01-15 ENCOUNTER — Encounter: Payer: BC Managed Care – PPO | Admitting: Internal Medicine

## 2011-01-15 DIAGNOSIS — Z0289 Encounter for other administrative examinations: Secondary | ICD-10-CM

## 2011-01-22 ENCOUNTER — Other Ambulatory Visit: Payer: BC Managed Care – PPO

## 2011-01-23 ENCOUNTER — Other Ambulatory Visit: Payer: BC Managed Care – PPO

## 2011-01-28 DIAGNOSIS — E039 Hypothyroidism, unspecified: Secondary | ICD-10-CM

## 2011-01-28 HISTORY — DX: Hypothyroidism, unspecified: E03.9

## 2011-01-29 ENCOUNTER — Encounter: Payer: BC Managed Care – PPO | Admitting: Family Medicine

## 2011-01-30 ENCOUNTER — Ambulatory Visit (INDEPENDENT_AMBULATORY_CARE_PROVIDER_SITE_OTHER): Payer: BC Managed Care – PPO | Admitting: Internal Medicine

## 2011-01-30 ENCOUNTER — Encounter: Payer: Self-pay | Admitting: Internal Medicine

## 2011-01-30 VITALS — BP 107/70 | HR 68 | Temp 98.7°F | Ht 65.0 in | Wt 136.0 lb

## 2011-01-30 DIAGNOSIS — E039 Hypothyroidism, unspecified: Secondary | ICD-10-CM

## 2011-01-30 DIAGNOSIS — E663 Overweight: Secondary | ICD-10-CM

## 2011-01-30 DIAGNOSIS — Z0000 Encounter for general adult medical examination without abnormal findings: Secondary | ICD-10-CM | POA: Insufficient documentation

## 2011-01-30 NOTE — Assessment & Plan Note (Signed)
Improved Has been slowly weaning off the phentermine

## 2011-01-30 NOTE — Assessment & Plan Note (Addendum)
Generally healthy counselling done She will set up with her gyn

## 2011-01-30 NOTE — Progress Notes (Signed)
Subjective:    Patient ID: Sara Howell, female    DOB: 10-Jan-1976, 34 y.o.   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 levothyroxine (SYNTHROID, LEVOTHROID) 50 MCG tablet Take 50 mcg by mouth daily. For underactive thyroid         Allergies  Allergen Reactions  . Corn-Containing Products   . Eggs Or Egg-Derived Products   . Peanut-Containing Drug Products     Past Medical History  Diagnosis Date  . Hypothyroidism     Past Surgical History  Procedure Date  . Cervical discectomy 2007    No specified location  . Muscle biopsy as a teenager    non specific  . Nsvd     x 2    Family History  Problem Relation Age of Onset  . Hepatitis Father     Hep C  . Hypertension Father   . Hypothyroidism Sister   . Crohn's disease Brother   . Asthma Brother   . Diabetes Paternal Grandfather   . Leukemia Paternal Grandfather   . Seizures Maternal Aunt     Epilepsy  . Seizures Maternal Uncle     Epilepsy  . Cancer Other     Liver  . Other Other     Epilepsy; 3 maternal aunts/uncles    History   Social History  . Marital Status: Married    Spouse Name: N/A    Number of Children: 3  . Years of Education: N/A   Occupational History  . Sales    Social History Main Topics  . Smoking status: Never Smoker   . Smokeless tobacco: Never Used  . Alcohol Use: Yes     Rarely  . Drug Use: No  . Sexually Active: Not on file   Other Topics Concern  . Not on file   Social History Narrative   Pt gets regular exercise with cardio 2 days a week and 3 classes a week.Diet consists of fruits and  veggies, doesn't like meat much and likes soda and sweet tea.   Review of Systems  Constitutional: Negative for fatigue and unexpected weight change.       Wears seat belt  HENT: Positive for hearing loss, congestion, rhinorrhea and tinnitus. Negative for dental problem.        Mild hearing loss Rarely needs meds for seasonal allergies Notices holes in tonsils and gets white "pebbles" out Regular with dentist Recent cold with some bloody mucus at times from nose  Eyes: Negative for visual disturbance.       No diplopia or unilateral vision loss  Respiratory: Negative for cough, chest tightness and shortness of breath.   Cardiovascular: Positive for chest pain and palpitations. Negative for leg swelling.       Brief sharp pain subxiphoid---?stress related  Gastrointestinal: Positive for constipation. Negative for nausea, vomiting, abdominal pain and blood in stool.       No sig heartburn occ gas and mild constipation  Genitourinary: Negative for dysuria, difficulty urinating and dyspareunia.       No gyn exam since gave birth over 1 year ago Will go back  to Julio Sicks NP Periods are normal--slightly heavier than in past still No sexual problems Has to be careful to empty bladder before exercise---does Kegels  Musculoskeletal: Positive for back pain. Negative for joint swelling and arthralgias.  Skin: Negative for rash.       No suspicious areas  Neurological: Positive for dizziness and headaches. Negative for syncope, weakness and light-headedness.       Occ headaches around menstrual cycles occ shooting pains down legs Has "myopathy"---doesn't overdo it  Hematological: Negative for adenopathy. Bruises/bleeds easily.  Psychiatric/Behavioral: Negative for sleep disturbance and dysphoric mood. The patient is not nervous/anxious.        Does get irritated easily--?related to kids and stress       Objective:   Physical Exam  Constitutional: She is oriented to person, place, and  time. She appears well-developed and well-nourished. No distress.  HENT:  Head: Normocephalic and atraumatic.  Right Ear: External ear normal.  Left Ear: External ear normal.  Mouth/Throat: Oropharynx is clear and moist. No oropharyngeal exudate.       TMs normal  Eyes: Conjunctivae and EOM are normal. Pupils are equal, round, and reactive to light.       Fundi benign  Neck: Normal range of motion. Neck supple. No thyromegaly present.  Cardiovascular: Normal rate, regular rhythm, normal heart sounds and intact distal pulses.  Exam reveals no gallop.   No murmur heard. Pulmonary/Chest: Effort normal and breath sounds normal. No respiratory distress. She has no wheezes. She has no rales.  Abdominal: Soft. She exhibits no mass. There is no tenderness.  Musculoskeletal: Normal range of motion. She exhibits no edema and no tenderness.  Lymphadenopathy:    She has no cervical adenopathy.  Neurological: She is alert and oriented to person, place, and time.  Skin: Skin is warm. No rash noted.  Psychiatric: She has a normal mood and affect. Her behavior is normal. Judgment and thought content normal.          Assessment & Plan:

## 2011-01-30 NOTE — Assessment & Plan Note (Signed)
Will check labs She wonders if she really needs meds----probably does but will see

## 2011-01-31 ENCOUNTER — Encounter: Payer: BC Managed Care – PPO | Admitting: Family Medicine

## 2011-02-04 ENCOUNTER — Ambulatory Visit: Payer: BC Managed Care – PPO | Admitting: Internal Medicine

## 2011-04-02 ENCOUNTER — Other Ambulatory Visit: Payer: Self-pay | Admitting: *Deleted

## 2011-04-02 NOTE — Telephone Encounter (Signed)
Please check with her about how she is using it--she was going to be weaning off it some If she is still taking it occ, can refill #30x  0

## 2011-04-02 NOTE — Telephone Encounter (Signed)
.  left message to have patient return my call.  

## 2011-04-02 NOTE — Telephone Encounter (Signed)
Pharmacy calling saying that patient from august that has expired that she never got filled for phentermine and is asking if they can have another prescription. Please advised

## 2011-04-02 NOTE — Telephone Encounter (Signed)
Spoke with patient and she had lost a rx from August and just found it and wanted to have it filled, she takes a half tablet twice daily. Ok to fill?

## 2011-04-03 MED ORDER — PHENTERMINE HCL 37.5 MG PO CAPS: 18.5000 mg | ORAL_CAPSULE | ORAL | Status: DC

## 2011-04-03 NOTE — Telephone Encounter (Signed)
Left message that rx is ready to be picked up tomorrow.

## 2011-04-03 NOTE — Telephone Encounter (Signed)
Addended by: Sueanne Margarita on: 04/03/2011 03:15 PM   Modules accepted: Orders

## 2011-04-03 NOTE — Telephone Encounter (Signed)
Okay #30 x 0  Let her know that I hope she is finishing up with this medication soon

## 2011-07-31 ENCOUNTER — Encounter (HOSPITAL_COMMUNITY): Payer: Self-pay | Admitting: Pharmacist

## 2011-08-12 ENCOUNTER — Encounter (HOSPITAL_COMMUNITY)
Admission: RE | Admit: 2011-08-12 | Discharge: 2011-08-12 | Disposition: A | Discharge status: Home / Self Care | Payer: BC Managed Care – PPO | Source: Ambulatory Visit | Attending: Obstetrics and Gynecology | Admitting: Obstetrics and Gynecology

## 2011-08-22 ENCOUNTER — Encounter (HOSPITAL_COMMUNITY): Payer: Self-pay | Admitting: Pharmacist

## 2011-09-04 ENCOUNTER — Encounter (HOSPITAL_COMMUNITY)
Admission: RE | Admit: 2011-09-04 | Discharge: 2011-09-04 | Disposition: A | Discharge status: Home / Self Care | Payer: BC Managed Care – PPO | Source: Ambulatory Visit | Attending: Obstetrics and Gynecology | Admitting: Obstetrics and Gynecology

## 2011-09-04 ENCOUNTER — Encounter (HOSPITAL_COMMUNITY): Payer: Self-pay

## 2011-09-04 HISTORY — DX: Nausea with vomiting, unspecified: R11.2

## 2011-09-04 HISTORY — DX: Other specified postprocedural states: Z98.890

## 2011-09-04 HISTORY — DX: Ventricular premature depolarization: I49.3

## 2011-09-04 LAB — CBC
HCT: 37.8 % (ref 36.0–46.0)
MCHC: 34.1 g/dL (ref 30.0–36.0)
MCV: 93.8 fL (ref 78.0–100.0)
Platelets: 150 10*3/uL (ref 150–400)

## 2011-09-04 LAB — SURGICAL PCR SCREEN: Staphylococcus aureus: NEGATIVE

## 2011-09-04 NOTE — Patient Instructions (Addendum)
20 Sara Howell  09/04/2011   Your procedure is scheduled on:  09/12/11  Enter through the Main Entrance of [facility] at 6 AM.  Pick up the phone at the desk and dial [phone].   Call this number if you have problems the morning of surgery: [phone]   Remember:   Do not eat food:After Midnight.  Do not drink clear liquids: After Midnight.  Take these medicines the morning of surgery with A SIP OF WATER: NA   Do not wear jewelry, make-up or nail polish.  Do not wear lotions, powders, or perfumes. You may wear deodorant.  Do not shave 48 hours prior to surgery.  Do not bring valuables to the hospital.  Contacts, dentures or bridgework may not be worn into surgery.  Leave suitcase in the car. After surgery it may be brought to your room.  For patients admitted to the hospital, checkout time is 11:00 AM the day of discharge.   Patients discharged the day of surgery will not be allowed to drive home.  Name and phone number of your driver: NA  Special Instructions: CHG Shower Use Special Wash: 1/2 bottle night before surgery and 1/2 bottle morning of surgery.   Please read over the following fact sheets that you were given: MRSA Information

## 2011-09-11 MED ORDER — DEXTROSE 5 % IV SOLN
2.0000 g | INTRAVENOUS | Status: AC
  Administered 2011-09-12: 2 g via INTRAVENOUS
  Filled 2011-09-11: qty 2

## 2011-09-11 NOTE — H&P (Addendum)
36 yo G4P3 presents for surgical mngt of dyspareunia & ovarian cyst  PMHx:  Hypothyroidism PSHx:  Discectomy SHx:  Neg, husband vasectomy FHx:  DM, breast ca, epilepsy OBHx:  SVD x 3 All:  Peanuts, corn, eggs Meds:  Synthroid  AF, VSS Gen - NAD CV - RRr Lungs - clear bilaterally Abd - soft, NT PV - tender w/ uterine manipulation  [place] - 2.9cm cyst right ov  A/P:  Dyspareunia & ovarian cyst Plan for dx l/s with fulgeration of endometriosis, ovarian cystectomy and possible RSO Plan of care discussed with pt and mom.  Informed consent

## 2011-09-12 ENCOUNTER — Encounter (HOSPITAL_COMMUNITY)
Admission: RE | Disposition: A | Discharge status: Home / Self Care | Payer: Self-pay | Source: Ambulatory Visit | Attending: Obstetrics and Gynecology

## 2011-09-12 ENCOUNTER — Encounter (HOSPITAL_COMMUNITY): Payer: Self-pay | Admitting: *Deleted

## 2011-09-12 ENCOUNTER — Encounter (HOSPITAL_COMMUNITY): Payer: Self-pay | Admitting: Anesthesiology

## 2011-09-12 ENCOUNTER — Ambulatory Visit (HOSPITAL_BASED_OUTPATIENT_CLINIC_OR_DEPARTMENT_OTHER)
Admission: RE | Admit: 2011-09-12 | Discharge: 2011-09-12 | Disposition: A | Discharge status: Home / Self Care | Payer: BC Managed Care – PPO | Source: Ambulatory Visit | Attending: Obstetrics and Gynecology | Admitting: Obstetrics and Gynecology

## 2011-09-12 ENCOUNTER — Ambulatory Visit (HOSPITAL_COMMUNITY): Payer: BC Managed Care – PPO | Admitting: Anesthesiology

## 2011-09-12 DIAGNOSIS — N949 Unspecified condition associated with female genital organs and menstrual cycle: Secondary | ICD-10-CM | POA: Insufficient documentation

## 2011-09-12 DIAGNOSIS — N83209 Unspecified ovarian cyst, unspecified side: Secondary | ICD-10-CM | POA: Insufficient documentation

## 2011-09-12 DIAGNOSIS — IMO0002 Reserved for concepts with insufficient information to code with codable children: Secondary | ICD-10-CM | POA: Insufficient documentation

## 2011-09-12 HISTORY — PX: LAPAROSCOPY: SHX197

## 2011-09-12 LAB — TYPE AND SCREEN: Antibody Screen: NEGATIVE

## 2011-09-12 LAB — ABO/RH: ABO/RH(D): O POS

## 2011-09-12 SURGERY — LAPAROSCOPY, DIAGNOSTIC
Anesthesia: General | Laterality: Right

## 2011-09-12 SURGERY — LAPAROSCOPY OPERATIVE
Anesthesia: General | Site: Abdomen | Laterality: Right | Wound class: Clean Contaminated

## 2011-09-12 MED ORDER — LIDOCAINE HCL (CARDIAC) 20 MG/ML IV SOLN
INTRAVENOUS | Status: AC
  Filled 2011-09-12: qty 5

## 2011-09-12 MED ORDER — FENTANYL CITRATE 0.05 MG/ML IJ SOLN: 25.0000 ug | INTRAMUSCULAR | Status: DC | PRN

## 2011-09-12 MED ORDER — FENTANYL CITRATE 0.05 MG/ML IJ SOLN
INTRAMUSCULAR | Status: DC | PRN
  Administered 2011-09-12: 50 ug via INTRAVENOUS
  Administered 2011-09-12 (×2): 100 ug via INTRAVENOUS

## 2011-09-12 MED ORDER — ONDANSETRON HCL 4 MG/2ML IJ SOLN
INTRAMUSCULAR | Status: AC
  Filled 2011-09-12: qty 2

## 2011-09-12 MED ORDER — PROPOFOL 10 MG/ML IV EMUL
INTRAVENOUS | Status: AC
  Filled 2011-09-12: qty 20

## 2011-09-12 MED ORDER — LIDOCAINE HCL (CARDIAC) 20 MG/ML IV SOLN
INTRAVENOUS | Status: DC | PRN
  Administered 2011-09-12: 50 mg via INTRAVENOUS

## 2011-09-12 MED ORDER — KETOROLAC TROMETHAMINE 30 MG/ML IJ SOLN
INTRAMUSCULAR | Status: DC | PRN
  Administered 2011-09-12: 30 mg via INTRAVENOUS

## 2011-09-12 MED ORDER — NEOSTIGMINE METHYLSULFATE 1 MG/ML IJ SOLN
INTRAMUSCULAR | Status: DC | PRN
  Administered 2011-09-12: 2 mg via INTRAVENOUS

## 2011-09-12 MED ORDER — KETOROLAC TROMETHAMINE 30 MG/ML IJ SOLN
INTRAMUSCULAR | Status: AC
  Filled 2011-09-12: qty 1

## 2011-09-12 MED ORDER — MIDAZOLAM HCL 2 MG/2ML IJ SOLN
INTRAMUSCULAR | Status: AC
  Filled 2011-09-12: qty 2

## 2011-09-12 MED ORDER — IBUPROFEN 200 MG PO TABS: 800.0000 mg | ORAL_TABLET | Freq: Three times a day (TID) | ORAL | Status: AC | PRN

## 2011-09-12 MED ORDER — DEXAMETHASONE SODIUM PHOSPHATE 4 MG/ML IJ SOLN
INTRAMUSCULAR | Status: DC | PRN
  Administered 2011-09-12: 8 mg via INTRAVENOUS

## 2011-09-12 MED ORDER — ROCURONIUM BROMIDE 100 MG/10ML IV SOLN
INTRAVENOUS | Status: DC | PRN
  Administered 2011-09-12: 40 mg via INTRAVENOUS

## 2011-09-12 MED ORDER — GLYCOPYRROLATE 0.2 MG/ML IJ SOLN
INTRAMUSCULAR | Status: AC
  Filled 2011-09-12: qty 1

## 2011-09-12 MED ORDER — ONDANSETRON HCL 4 MG/2ML IJ SOLN
INTRAMUSCULAR | Status: DC | PRN
  Administered 2011-09-12: 4 mg via INTRAVENOUS

## 2011-09-12 MED ORDER — BUPIVACAINE HCL (PF) 0.25 % IJ SOLN
INTRAMUSCULAR | Status: DC | PRN
  Administered 2011-09-12: 2 mL

## 2011-09-12 MED ORDER — OXYCODONE-ACETAMINOPHEN 5-325 MG PO TABS: 1.0000 | ORAL_TABLET | ORAL | Status: AC | PRN

## 2011-09-12 MED ORDER — PROPOFOL 10 MG/ML IV EMUL
INTRAVENOUS | Status: DC | PRN
  Administered 2011-09-12: 150 mg via INTRAVENOUS

## 2011-09-12 MED ORDER — GLYCOPYRROLATE 0.2 MG/ML IJ SOLN
INTRAMUSCULAR | Status: DC | PRN
  Administered 2011-09-12: 0.2 mg via INTRAVENOUS
  Administered 2011-09-12: 0.4 mg via INTRAVENOUS

## 2011-09-12 MED ORDER — BUPIVACAINE HCL (PF) 0.25 % IJ SOLN
INTRAMUSCULAR | Status: AC
  Filled 2011-09-12: qty 30

## 2011-09-12 MED ORDER — ROCURONIUM BROMIDE 50 MG/5ML IV SOLN
INTRAVENOUS | Status: AC
  Filled 2011-09-12: qty 1

## 2011-09-12 MED ORDER — NEOSTIGMINE METHYLSULFATE 1 MG/ML IJ SOLN
INTRAMUSCULAR | Status: AC
  Filled 2011-09-12: qty 10

## 2011-09-12 MED ORDER — LACTATED RINGERS IR SOLN
Status: DC | PRN
  Administered 2011-09-12: 3000 mL

## 2011-09-12 MED ORDER — SCOPOLAMINE 1 MG/3DAYS TD PT72
MEDICATED_PATCH | TRANSDERMAL | Status: AC
  Administered 2011-09-12: 1.5 mg
  Filled 2011-09-12: qty 1

## 2011-09-12 MED ORDER — DEXAMETHASONE SODIUM PHOSPHATE 10 MG/ML IJ SOLN
INTRAMUSCULAR | Status: AC
  Filled 2011-09-12: qty 1

## 2011-09-12 MED ORDER — METOCLOPRAMIDE HCL 5 MG/ML IJ SOLN
INTRAMUSCULAR | Status: AC
  Administered 2011-09-12: 10 mg via INTRAVENOUS
  Filled 2011-09-12: qty 2

## 2011-09-12 MED ORDER — FENTANYL CITRATE 0.05 MG/ML IJ SOLN
INTRAMUSCULAR | Status: AC
  Filled 2011-09-12: qty 5

## 2011-09-12 MED ORDER — MIDAZOLAM HCL 5 MG/5ML IJ SOLN
INTRAMUSCULAR | Status: DC | PRN
  Administered 2011-09-12: 2 mg via INTRAVENOUS

## 2011-09-12 MED ORDER — METOCLOPRAMIDE HCL 5 MG/ML IJ SOLN
10.0000 mg | Freq: Once | INTRAMUSCULAR | Status: AC | PRN
  Administered 2011-09-12: 10 mg via INTRAVENOUS

## 2011-09-12 MED ORDER — MEPERIDINE HCL 25 MG/ML IJ SOLN: 6.2500 mg | INTRAMUSCULAR | Status: DC | PRN

## 2011-09-12 MED ORDER — LACTATED RINGERS IV SOLN
INTRAVENOUS | Status: DC
  Administered 2011-09-12: 1000 mL via INTRAVENOUS

## 2011-09-12 SURGICAL SUPPLY — 33 items
ADH SKN CLS APL DERMABOND .7 (GAUZE/BANDAGES/DRESSINGS) ×2
BAG SPEC RTRVL LRG 6X4 10 (ENDOMECHANICALS) ×2
BARRIER ADHS 3X4 INTERCEED (GAUZE/BANDAGES/DRESSINGS) IMPLANT
BRR ADH 4X3 ABS CNTRL BYND (GAUZE/BANDAGES/DRESSINGS)
CABLE HIGH FREQUENCY MONO STRZ (ELECTRODE) IMPLANT
CATH ROBINSON RED A/P 16FR (CATHETERS) ×3 IMPLANT
CHLORAPREP W/TINT 26ML (MISCELLANEOUS) ×4 IMPLANT
CLOTH BEACON ORANGE TIMEOUT ST (SAFETY) ×3 IMPLANT
DERMABOND ADVANCED (GAUZE/BANDAGES/DRESSINGS) ×1
DERMABOND ADVANCED .7 DNX12 (GAUZE/BANDAGES/DRESSINGS) ×2 IMPLANT
GLOVE BIO SURGEON STRL SZ 6.5 (GLOVE) ×3 IMPLANT
GLOVE BIOGEL PI IND STRL 7.0 (GLOVE) ×4 IMPLANT
GLOVE BIOGEL PI INDICATOR 7.0 (GLOVE) ×2
GOWN PREVENTION PLUS LG XLONG (DISPOSABLE) ×6 IMPLANT
NS IRRIG 1000ML POUR BTL (IV SOLUTION) ×3 IMPLANT
PACK LAPAROSCOPY BASIN (CUSTOM PROCEDURE TRAY) ×3 IMPLANT
POUCH SPECIMEN RETRIEVAL 10MM (ENDOMECHANICALS) ×2 IMPLANT
PROTECTOR NERVE ULNAR (MISCELLANEOUS) ×3 IMPLANT
SCISSORS LAP 5X45 EPIX DISP (ENDOMECHANICALS) ×2 IMPLANT
SEALER TISSUE G2 CVD JAW 35 (ENDOMECHANICALS) IMPLANT
SEALER TISSUE G2 CVD JAW 45CM (ENDOMECHANICALS) ×2 IMPLANT
SET IRRIG TUBING LAPAROSCOPIC (IRRIGATION / IRRIGATOR) ×2 IMPLANT
SLEEVE ADV FIXATION 5X100MM (TROCAR) IMPLANT
STRIP CLOSURE SKIN 1/2X4 (GAUZE/BANDAGES/DRESSINGS) IMPLANT
SUT VIC AB 3-0 PS2 18 (SUTURE) ×3
SUT VIC AB 3-0 PS2 18XBRD (SUTURE) ×2 IMPLANT
SUT VICRYL 0 UR6 27IN ABS (SUTURE) ×3 IMPLANT
TOWEL OR 17X24 6PK STRL BLUE (TOWEL DISPOSABLE) ×6 IMPLANT
TROCAR Z-THREAD BLADED 11X100M (TROCAR) ×2 IMPLANT
TROCAR Z-THREAD BLADED 5X100MM (TROCAR) ×3 IMPLANT
TROCAR Z-THREAD FIOS 11X100 BL (TROCAR) ×3 IMPLANT
WARMER LAPAROSCOPE (MISCELLANEOUS) ×3 IMPLANT
WATER STERILE IRR 1000ML POUR (IV SOLUTION) ×3 IMPLANT

## 2011-09-12 NOTE — Transfer of Care (Signed)
Immediate Anesthesia Transfer of Care Note  Patient: Sara Howell  Procedure(s) Performed: Procedure(s) (LRB): LAPAROSCOPY OPERATIVE (Right)  Patient Location: PACU  Anesthesia Type: General  Level of Consciousness: awake, alert  and oriented  Airway & Oxygen Therapy: Patient Spontanous Breathing and Patient connected to nasal cannula oxygen  Post-op Assessment: Report given to PACU RN and Post -op Vital signs reviewed and stable  Post vital signs: Reviewed and stable  Complications: No apparent anesthesia complications

## 2011-09-12 NOTE — Anesthesia Procedure Notes (Signed)
Procedure Name: Intubation Date/Time: 09/12/2011 7:29 AM Performed by: Shanon Payor Pre-anesthesia Checklist: Suction available, Emergency Drugs available, Timeout performed, Patient identified and Patient being monitored Patient Re-evaluated:Patient Re-evaluated prior to inductionOxygen Delivery Method: Circle system utilized Preoxygenation: Pre-oxygenation with 100% oxygen Intubation Type: IV induction Ventilation: Mask ventilation without difficulty Laryngoscope Size: Mac and 3 Grade View: Grade I Tube type: Oral Tube size: 7.0 mm Number of attempts: 1 Airway Equipment and Method: Stylet Placement Confirmation: ETT inserted through vocal cords under direct vision,  positive ETCO2 and breath sounds checked- equal and bilateral Secured at: 22 cm Tube secured with: Tape Dental Injury: Teeth and Oropharynx as per pre-operative assessment

## 2011-09-12 NOTE — Discharge Instructions (Signed)
[facility] office 2-3 weeks for postop appointment.  Call the office [phone] for an appointment.  Personal Hygiene: Use pads not tampons x 1week You may shower, no tub baths or pools for 2-3 weeks Wipe from front to back when using restroom  Activity: Do not drive or operate any equipment for 24 hrs.   Do not rest in bed all day Walking is encouraged Walk up and down stairs slowly You may return to your normal activity in 1-2 weeks Advance your activity slowly and and  Sexual Activity:  No intercourse for 2 weeks after the procedure.  Diet: Eat a light meal as desired this evening.  You may resume your usual diet tomorrow.  Return to work:  You may resume your work activities after 1-2 weeks  What to expect:  Expect to have vaginal bleeding/discharge for 2-3 days and spotting for 10-14 days.  It is not unusual to have soreness for 1-2 weeks.  You may have a slight burning sensation when you urinate for the first few days.  You may start your menses in 2-6 weeks.  Mild cramps may continue for a couple of days.  You may have right shoulder pain following laparoscopy in and and and and he I. And and is in and and he is in and and and pelvic pain and he is the right he had multiple and and and and and and and is in and and and an a and and and in and and is a and and a and a and and and  Call your doctor:   Excessive bleeding, saturating a pad every hour Inability to urinate 6 hours after discharge Pain not relieved with pain medications Fever of 100.4 or greater  SEEK MEDICAL CARE IF:   There is increasing abdominal pain.   There is new pain in the shoulders (shoulder strap areas).   You feel lightheaded or faint.   You have the chills.   You or your child has an oral temperature above 102 F (38.9 C).   There is pus-like (purulent) drainage from any of the wounds.   You are unable to pass gas or have a bowel movement.   You feel sick to your stomach (nauseous) or throw up  (vomit).  MAKE SURE YOU:   Understand these instructions.   Will watch your condition.   Will get help right away if you are not doing well or get worse.  Document Released: 07/22/2000 Document Revised: 04/04/2011 Document Reviewed: 04/15/2007 [facility] Patient Information 2012 [place], [place].

## 2011-09-12 NOTE — Anesthesia Postprocedure Evaluation (Signed)
Anesthesia Post Note  Patient: Sara Howell  Procedure(s) Performed: Procedure(s) (LRB): LAPAROSCOPY OPERATIVE (Right)  Anesthesia type: General  Patient location: PACU  Post pain: Pain level controlled  Post assessment: Post-op Vital signs reviewed  Last Vitals:  Filed Vitals:   09/12/11 1100  BP: 107/72  Pulse: 92  Temp:   Resp:     Post vital signs: Reviewed  Level of consciousness: sedated  Complications: No apparent anesthesia complications

## 2011-09-12 NOTE — Anesthesia Preprocedure Evaluation (Addendum)
Anesthesia Evaluation    History of Anesthesia Complications (+) PONV  Airway Mallampati: II TM Distance: >3 FB Neck ROM: Full    Dental No notable dental hx. (+) Teeth Intact   Pulmonary neg pulmonary ROS,  breath sounds clear to auscultation  Pulmonary exam normal       Cardiovascular + dysrhythmias Rhythm:Regular Rate:Normal     Neuro/Psych negative neurological ROS  negative psych ROS   GI/Hepatic negative GI ROS, Neg liver ROS,   Endo/Other  Hypothyroidism   Renal/GU negative Renal ROS  negative genitourinary   Musculoskeletal negative musculoskeletal ROS (+)   Abdominal Normal abdominal exam  (+)   Peds  Hematology negative hematology ROS (+)   Anesthesia Other Findings   Reproductive/Obstetrics negative OB ROS                           Anesthesia Physical Anesthesia Plan  ASA: II  Anesthesia Plan: General   Post-op Pain Management:    Induction: Intravenous  Airway Management Planned: Oral ETT  Additional Equipment:   Intra-op Plan:   Post-operative Plan: Extubation in OR  Informed Consent: I have reviewed the patients History and Physical, chart, labs and discussed the procedure including the risks, benefits and alternatives for the proposed anesthesia with the patient or authorized representative who has indicated his/her understanding and acceptance.   Dental advisory given  Plan Discussed with: CRNA, Anesthesiologist and Surgeon  Anesthesia Plan Comments: (Ok to use propofol. )       Anesthesia Quick Evaluation

## 2011-09-13 NOTE — Op Note (Signed)
Sara Howell, Sara Howell                ACCOUNT NO.:  [id number]  MEDICAL RECORD NO.:  [id number]  LOCATION:  [facility]                          FACILITY:  [facility]  PHYSICIAN:  Zelphia Cairo, MD    DATE OF BIRTH:  [birth year]  DATE OF PROCEDURE:  09/12/2011 DATE OF DISCHARGE:  09/12/2011                              OPERATIVE REPORT   PREOPERATIVE DIAGNOSES: 1. Pelvic pain. 2. Ovarian cyst.  POSTOPERATIVE DIAGNOSIS:  Multiple right ovarian cysts.  PROCEDURE:  Operative laparoscopy with right oophorectomy.  SURGEON:  Zelphia Cairo, MD  ANESTHESIA:  General.  BLOOD LOSS:  Minimal.  SPECIMEN:  Right ovary with ovarian cyst.  COMPLICATIONS:  None.  CONDITION:  Stable to the recovery room.  DESCRIPTION OF PROCEDURE:  After informed consent was obtained, she was taken to the operating room, where general anesthesia was obtained.  She was placed in the dorsal lithotomy position using Allen stirrups.  She was prepped and draped in sterile fashion.  An in and out catheter was used to drain her bladder.  Bivalve speculum was placed in the vagina. Single-tooth tenaculum placed on the anterior lip of the cervix.  Hulka clamp was placed in the uterus.  Tenaculum and speculum were removed and our attention was turned to the abdomen.  Local anesthesia was injected at the site of our infraumbilical and suprapubic incision.  An infraumbilical skin incision was then made with a scalpel and extended bluntly to the level of the fascia using a Kelly clamp.  Optical trocar was then inserted under direct visualization. Once intraperitoneal placement was confirmed, CO2 was turned on and the abdomen and pelvis were insufflated.  Right upper quadrant appeared normal.  Appendix appeared normal.  Left adnexa appeared normal and the uterus appeared normal.  Right fallopian tube appeared normal.  Her right ovary was enlarged with what appeared to be multiple ovarian cysts, complex in nature.  The suprapubic  incision was made with a scalpel and trocar was inserted under direct visualization.  Blunt graspers were used to grasp the right ovary and tented upwards. Monopolar scissors were used to make an incision over the right ovarian cyst and ovarian cyst was peeled free from the right ovary.  The base of the cyst was dissected free from the right ovary using the instill device.  Ovarian cyst was removed and passed off to be sent to pathology.  Another incision was made over the next ovarian cyst; however, it could not easily be peeled from the right ovary.  It appeared complex in nature and consumed the majority of the ovarian tissue.  Concern for not being able to remove the entire cyst wall and the remaining ovarian cyst guided my decision to perform an oophorectomy.  The ovary was tented upwards and toward midline.  The instill device was used to cauterize and cut the plane between the ovary and the fallopian tube.  The infundibulopelvic ligament was cauterized and cut using the instill.  Once the ovary was dissected free, it was placed in the anterior cul-de-sac.  A specimen bag was inserted into the suprapubic incision.  The specimen was scooped into this bag and removed through the suprapubic incision intact.  A deep stitch was placed in the suprapubic incision to reapproximate the fascia.  The skin was closed with Vicryl and Dermabond was placed over both incisions.  Hulka clamp was removed.  The patient was extubated and taken to the recovery room in stable condition.  Sponge, lap, needle, and instrument counts were correct x2.  After the surgery, the ovarian cysts were dissected using scissors.  Mucinous clear fluid was drained from the cysts.  One cyst appeared to be filled with fat and mucin as well.  Specimen was placed in the cup and to be sent to pathology.     Zelphia Cairo, MD     GA/MEDQ  D:  09/12/2011  T:  09/13/2011  Job:  [id number]

## 2011-09-14 ENCOUNTER — Encounter (HOSPITAL_COMMUNITY): Payer: Self-pay | Admitting: Obstetrics and Gynecology

## 2011-09-18 ENCOUNTER — Encounter (HOSPITAL_COMMUNITY): Payer: Self-pay

## 2011-09-19 ENCOUNTER — Ambulatory Visit (HOSPITAL_COMMUNITY)
Admission: AD | Admit: 2011-09-19 | Payer: BC Managed Care – PPO | Source: Ambulatory Visit | Admitting: Obstetrics and Gynecology

## 2011-09-19 ENCOUNTER — Encounter (HOSPITAL_COMMUNITY): Admission: AD | Payer: Self-pay | Source: Ambulatory Visit

## 2011-09-19 SURGERY — LAPAROSCOPY OPERATIVE
Anesthesia: General | Laterality: Right

## 2012-01-08 ENCOUNTER — Ambulatory Visit (INDEPENDENT_AMBULATORY_CARE_PROVIDER_SITE_OTHER): Payer: BC Managed Care – PPO | Admitting: Internal Medicine

## 2012-01-08 ENCOUNTER — Encounter: Payer: Self-pay | Admitting: Internal Medicine

## 2012-01-08 ENCOUNTER — Encounter: Payer: Self-pay | Admitting: *Deleted

## 2012-01-08 VITALS — BP 102/68 | HR 67 | Temp 98.0°F | Wt 145.0 lb

## 2012-01-08 DIAGNOSIS — E663 Overweight: Secondary | ICD-10-CM

## 2012-01-08 DIAGNOSIS — E039 Hypothyroidism, unspecified: Secondary | ICD-10-CM

## 2012-01-08 LAB — T4, FREE: Free T4: 0.78 ng/dL (ref 0.60–1.60)

## 2012-01-08 LAB — TSH: TSH: 4.52 u[IU]/mL (ref 0.35–5.50)

## 2012-01-08 MED ORDER — PHENTERMINE HCL 37.5 MG PO TABS: 37.5000 mg | ORAL_TABLET | Freq: Every day | ORAL | Status: DC

## 2012-01-08 NOTE — Progress Notes (Signed)
Subjective:    Patient ID: Sara Howell, female    DOB: 06/02/1975, 36 y.o.   MRN: [id number]  HPI Doing okay Had laparoscopy for pelvic pain in May--had ovarian cyst  Has gone off the phentermine Notes the same drift up in weight which concerns her Same exercise schedule and eating Weight is up 10# Goal weight is 130#  Stopped the thyroid med after my last visit Felt better off the med Had thyroid tests done at gyn and were normal so she stayed off  No current outpatient prescriptions on file prior to visit.    Allergies  Allergen Reactions  . Corn-Containing Products   . Eggs Or Egg-Derived Products   . Peanut-Containing Drug Products     Past Medical History  Diagnosis Date  . PONV (postoperative nausea and vomiting)   . PVC (premature ventricular contraction)     no meds no cardiologist routinely  . Hypothyroidism 10/12    post partum only, no further need for meds    Past Surgical History  Procedure Date  . Cervical discectomy 2007    No specified location  . Muscle biopsy as a teenager    non specific  . Nsvd     x 2  . Ganglion cyst excision 2011  . Wisdom tooth extraction   . Laparoscopy 09/12/2011    Procedure: LAPAROSCOPY OPERATIVE;  Surgeon: Zelphia Cairo, MD;  Location: [facility];  Service: Gynecology;  Laterality: Right;  right oophorectomy    Family History  Problem Relation Age of Onset  . Hepatitis Father     Hep C  . Hypertension Father   . Hypothyroidism Sister   . Crohn's disease Brother   . Asthma Brother   . Diabetes Paternal Grandfather   . Leukemia Paternal Grandfather   . Seizures Maternal Aunt     Epilepsy  . Seizures Maternal Uncle     Epilepsy  . Cancer Other     Liver  . Other Other     Epilepsy; 3 maternal aunts/uncles    History   Social History  . Marital Status: Married    Spouse Name: N/A    Number of Children: 3  . Years of Education: N/A   Occupational History  . Sales    Social History Main Topics    . Smoking status: Never Smoker   . Smokeless tobacco: Never Used  . Alcohol Use: Yes     Rarely  . Drug Use: No  . Sexually Active: Not on file   Other Topics Concern  . Not on file   Social History Narrative   Pt gets regular exercise with cardio 2 days a week and 3 classes a week.Diet consists of fruits and veggies, doesn't like meat much and likes soda and sweet tea.   Review of Systems Sleeps okay Mood is fine--just mild premenstrual symptoms    Objective:   Physical Exam  Constitutional: She appears well-developed and well-nourished. No distress.  Neck: Normal range of motion. Neck supple. No thyromegaly present.  Cardiovascular: Normal rate, regular rhythm and normal heart sounds.  Exam reveals no gallop.   No murmur heard. Pulmonary/Chest: Effort normal and breath sounds normal. No respiratory distress. She has no wheezes. She has no rales.  Musculoskeletal: She exhibits no edema and no tenderness.  Lymphadenopathy:    She has no cervical adenopathy.  Psychiatric: She has a normal mood and affect. Her behavior is normal. Thought content normal.  Assessment & Plan:

## 2012-01-08 NOTE — Assessment & Plan Note (Signed)
Really did better on the phentermine Less cravings Weight back up Will restart---urged using lowest effective dose (like not every day)

## 2012-01-08 NOTE — Assessment & Plan Note (Signed)
Feels better off the meds Will recheck thyroid function

## 2012-01-09 LAB — GLUCOSE, RANDOM: Glucose, Bld: 82 mg/dL (ref 70–99)

## 2012-01-10 ENCOUNTER — Encounter: Payer: Self-pay | Admitting: *Deleted

## 2012-01-31 ENCOUNTER — Encounter: Payer: BC Managed Care – PPO | Admitting: Internal Medicine

## 2012-06-16 ENCOUNTER — Ambulatory Visit (INDEPENDENT_AMBULATORY_CARE_PROVIDER_SITE_OTHER): Payer: BC Managed Care – PPO | Admitting: Family Medicine

## 2012-06-16 ENCOUNTER — Encounter: Payer: Self-pay | Admitting: Family Medicine

## 2012-06-16 VITALS — BP 112/64 | HR 68 | Temp 98.5°F | Wt 144.0 lb

## 2012-06-16 DIAGNOSIS — J069 Acute upper respiratory infection, unspecified: Secondary | ICD-10-CM

## 2012-06-16 MED ORDER — AMOXICILLIN-POT CLAVULANATE 875-125 MG PO TABS: 1.0000 | ORAL_TABLET | Freq: Two times a day (BID) | ORAL | Status: DC

## 2012-06-16 NOTE — Assessment & Plan Note (Signed)
Nontoxic.  Discussed options.  Would be reasonable to use nasal saline in meantime and hold rx for abx.  If facial pain continues, she will start augmentin.  Supportive tx in meantime o/w.  She agrees.  F/u prn.

## 2012-06-16 NOTE — Patient Instructions (Addendum)
Drink plenty of fluids and use nasal saline frequently.  This should gradually improve.  Take care.  Let us know if you have other concerns.   If not improving, then start the augmentin.

## 2012-06-16 NOTE — Progress Notes (Signed)
duration of symptoms: ~5 days ago, worse in the last 2-3 Fatigue noted initially, then sinus pressure and facial pain near the eyes.  Rhinorrhea:yes, discolored congestion:yes ear pain:no pain but feels some pressure B sore throat: no Cough: some Myalgias: some, in her back and shoulders  Fever of ~100 yesterday.  Husband has been sick.   This is the 3rd illness for her this winter.    ROS: See HPI.  Otherwise negative.    Meds, vitals, and allergies reviewed.   GEN: nad, alert and oriented HEENT: mucous membranes moist, TM w/o erythema, nasal epithelium injected, OP with cobblestoning, sinuses mildly ttp x4 NECK: supple w/o LA CV: rrr. PULM: ctab, no inc wob ABD: soft, +bs EXT: no edema

## 2012-09-24 ENCOUNTER — Other Ambulatory Visit: Payer: Self-pay | Admitting: *Deleted

## 2012-09-24 MED ORDER — PHENTERMINE HCL 37.5 MG PO CAPS: 37.5000 mg | ORAL_CAPSULE | ORAL | Status: DC

## 2012-09-24 NOTE — Telephone Encounter (Signed)
Pt switched from Dr. Alphonsus Sias to Dr. Para March ? Pt last seen by [facility] 12/2011  Faxed request asking for phentermine 37.5mg  cap, not 15 mg.  Please advise

## 2012-09-24 NOTE — Telephone Encounter (Signed)
No, she is still my patient as far as I know  I only filled 2 months---she needs an appt before I can give any more

## 2012-09-24 NOTE — Telephone Encounter (Signed)
Left message on machine that rx is ready for pick-up, and it will be at our front desk.  Wrote message on envelope that pt needs an appt for next refill

## 2012-09-25 ENCOUNTER — Encounter: Payer: Self-pay | Admitting: Internal Medicine

## 2012-10-06 ENCOUNTER — Encounter: Payer: Self-pay | Admitting: Internal Medicine

## 2012-10-06 ENCOUNTER — Ambulatory Visit (INDEPENDENT_AMBULATORY_CARE_PROVIDER_SITE_OTHER): Payer: BC Managed Care – PPO | Admitting: Internal Medicine

## 2012-10-06 VITALS — BP 100/68 | HR 54 | Temp 98.4°F | Wt 144.0 lb

## 2012-10-06 DIAGNOSIS — R634 Abnormal weight loss: Secondary | ICD-10-CM

## 2012-10-06 DIAGNOSIS — E663 Overweight: Secondary | ICD-10-CM

## 2012-10-06 NOTE — Assessment & Plan Note (Signed)
Still gets a clear positive effect for weight loss on the med Will continue  No explanation for positive gabapentin--she does take some Herbalife shakes,etc----they should not have it

## 2012-10-06 NOTE — Progress Notes (Signed)
  Subjective:    Patient ID: Sara Howell, female    DOB: [birth year], 37 y.o.   MRN: [id number]  HPI Doing okay Hasn't been having pain from the ovarian cyst any more--- only occ vague discomfort  Has been using the phentermine again Went off for a while Had lost the weight when on it--- then gained it back when she tried to go off it  Not on any other meds Thyroid tests were normal last time---has been off meds  Occasionally feels more tired on the phentermine It seems to affect her moods, may be more irritable, etc on the med  No current outpatient prescriptions on file prior to visit.   No current facility-administered medications on file prior to visit.    Allergies  Allergen Reactions  . Corn-Containing Products   . Eggs Or Egg-Derived Products   . Peanut-Containing Drug Products     Past Medical History  Diagnosis Date  . PONV (postoperative nausea and vomiting)   . PVC (premature ventricular contraction)     no meds no cardiologist routinely  . Hypothyroidism 10/12    post partum only, no further need for meds    Past Surgical History  Procedure Laterality Date  . Cervical discectomy  2007    No specified location  . Muscle biopsy  as a teenager    non specific  . Nsvd      x 2  . Ganglion cyst excision  2011  . Wisdom tooth extraction    . Laparoscopy  09/12/2011    Procedure: LAPAROSCOPY OPERATIVE;  Surgeon: Zelphia Cairo, MD;  Location: [facility];  Service: Gynecology;  Laterality: Right;  right oophorectomy    Family History  Problem Relation Age of Onset  . Hepatitis Father     Hep C  . Hypertension Father   . Hypothyroidism Sister   . Crohn's disease Brother   . Asthma Brother   . Diabetes Paternal Grandfather   . Leukemia Paternal Grandfather   . Seizures Maternal Aunt     Epilepsy  . Seizures Maternal Uncle     Epilepsy  . Cancer Other     Liver  . Other Other     Epilepsy; 3 maternal aunts/uncles    History   Social History  .  Marital Status: Married    Spouse Name: N/A    Number of Children: 3  . Years of Education: N/A   Occupational History  . Sales    Social History Main Topics  . Smoking status: Never Smoker   . Smokeless tobacco: Never Used  . Alcohol Use: Yes     Comment: Rarely  . Drug Use: No  . Sexually Active: Not on file   Other Topics Concern  . Not on file   Social History Narrative   Pt gets regular exercise with cardio 2 days a week and 3 classes a week.   Diet consists of fruits and veggies, doesn't like meat much and likes soda and sweet tea.   Review of Systems Uses tums for heartburn Occasional azo for some bladder symptoms Sleeps okay as long as she isn't late taking phentermine (may feel jittery if she tries to take afternoon nap)    Objective:   Physical Exam        Assessment & Plan:

## 2012-10-12 ENCOUNTER — Encounter: Payer: Self-pay | Admitting: Internal Medicine

## 2012-11-12 ENCOUNTER — Ambulatory Visit (INDEPENDENT_AMBULATORY_CARE_PROVIDER_SITE_OTHER): Payer: BC Managed Care – PPO | Admitting: Internal Medicine

## 2012-11-12 ENCOUNTER — Encounter: Payer: Self-pay | Admitting: Internal Medicine

## 2012-11-12 ENCOUNTER — Encounter: Payer: Self-pay | Admitting: Gastroenterology

## 2012-11-12 VITALS — BP 120/70 | HR 95 | Temp 98.0°F | Wt 145.0 lb

## 2012-11-12 DIAGNOSIS — K922 Gastrointestinal hemorrhage, unspecified: Secondary | ICD-10-CM

## 2012-11-12 NOTE — Progress Notes (Signed)
Subjective:    Patient ID: Sara Howell, female    DOB: [birth year], 37 y.o.   MRN: [id number]  HPI Went to the bathroom-- saw blood in the toilet bowl (this afternoon) Thought at first it was her period, then released it was from rectum Fairly regular stool for her---formed but no overly hard Has had cuts in the past from pushing too hard or fast  Has chronic abdominal pain (since right ovary removed last year)---intermittent and various spots Daily but some variability Some in low back also Told endometriosis was isolated to the ovary  Brother has Crohn's disease---has needed surgery x 2 No colon cancer in family  Current Outpatient Prescriptions on File Prior to Visit  Medication Sig Dispense Refill  . phentermine 37.5 MG capsule Take 18.75 mg by mouth every morning.       No current facility-administered medications on file prior to visit.    Allergies  Allergen Reactions  . Corn-Containing Products   . Eggs Or Egg-Derived Products   . Peanut-Containing Drug Products     Past Medical History  Diagnosis Date  . PONV (postoperative nausea and vomiting)   . PVC (premature ventricular contraction)     no meds no cardiologist routinely  . Hypothyroidism 10/12    post partum only, no further need for meds    Past Surgical History  Procedure Laterality Date  . Cervical discectomy  2007    No specified location  . Muscle biopsy  as a teenager    non specific  . Nsvd      x 2  . Ganglion cyst excision  2011  . Wisdom tooth extraction    . Laparoscopy  09/12/2011    Procedure: LAPAROSCOPY OPERATIVE;  Surgeon: Zelphia Cairo, MD;  Location: [facility];  Service: Gynecology;  Laterality: Right;  right oophorectomy    Family History  Problem Relation Age of Onset  . Hepatitis Father     Hep C  . Hypertension Father   . Hypothyroidism Sister   . Crohn's disease Brother   . Asthma Brother   . Diabetes Paternal Grandfather   . Leukemia Paternal Grandfather   .  Seizures Maternal Aunt     Epilepsy  . Seizures Maternal Uncle     Epilepsy  . Cancer Other     Liver  . Other Other     Epilepsy; 3 maternal aunts/uncles    History   Social History  . Marital Status: Married    Spouse Name: N/A    Number of Children: 3  . Years of Education: N/A   Occupational History  . Sales    Social History Main Topics  . Smoking status: Never Smoker   . Smokeless tobacco: Never Used  . Alcohol Use: Yes     Comment: Rarely  . Drug Use: No  . Sexually Active: Not on file   Other Topics Concern  . Not on file   Social History Narrative   Pt gets regular exercise with cardio 2 days a week and 3 classes a week.   Diet consists of fruits and veggies, doesn't like meat much and likes soda and sweet tea.   Review of Systems Appetite is okay Weight is stable No dysuria or hematuria--some pressure feeling at times (since the surgery) Dyspareunia since the pelvic surgery also    Objective:   Physical Exam  Constitutional: She appears well-developed and well-nourished. No distress.  Abdominal: Soft. She exhibits no distension and no mass. There is  tenderness. There is no rebound and no guarding.  Initial mild RUQ tenderness---seemed to get better  Genitourinary:  No mass, fissure or sig hemorrhoid No stool in vault---secretions are heme positive          Assessment & Plan:

## 2012-11-12 NOTE — Assessment & Plan Note (Signed)
BRB in bowl No clear local source Secretions heme positive so must be from somewhere colonic Has had chronic abdominal pain---she relates to past gyn surgery FH-- Crohn's  Will check blood work Will go ahead with GI referral

## 2012-11-13 ENCOUNTER — Encounter: Payer: Self-pay | Admitting: *Deleted

## 2012-11-13 LAB — CBC WITH DIFFERENTIAL/PLATELET
Basophils Absolute: 0 10*3/uL (ref 0.0–0.1)
Eosinophils Absolute: 0.1 10*3/uL (ref 0.0–0.7)
Hemoglobin: 12.9 g/dL (ref 12.0–15.0)
Lymphocytes Relative: 25.6 % (ref 12.0–46.0)
Monocytes Relative: 6.9 % (ref 3.0–12.0)
Neutro Abs: 3.3 10*3/uL (ref 1.4–7.7)
Neutrophils Relative %: 65.9 % (ref 43.0–77.0)
RDW: 12.9 % (ref 11.5–14.6)

## 2012-11-13 LAB — C-REACTIVE PROTEIN: CRP: <0.5 mg/dL (ref 0.5–20.0)

## 2012-11-13 LAB — SEDIMENTATION RATE: Sed Rate: 8 mm/hr (ref 0–22)

## 2012-11-25 ENCOUNTER — Ambulatory Visit (INDEPENDENT_AMBULATORY_CARE_PROVIDER_SITE_OTHER): Payer: BC Managed Care – PPO | Admitting: Gastroenterology

## 2012-11-25 ENCOUNTER — Encounter: Payer: Self-pay | Admitting: Gastroenterology

## 2012-11-25 VITALS — BP 110/76 | HR 76 | Ht 60.0 in | Wt 144.2 lb

## 2012-11-25 DIAGNOSIS — K625 Hemorrhage of anus and rectum: Secondary | ICD-10-CM

## 2012-11-25 DIAGNOSIS — Z8742 Personal history of other diseases of the female genital tract: Secondary | ICD-10-CM

## 2012-11-25 MED ORDER — NA SULFATE-K SULFATE-MG SULF 17.5-3.13-1.6 GM/177ML PO SOLN: ORAL | Status: DC

## 2012-11-25 NOTE — Progress Notes (Signed)
History of Present Illness:  This is a very pleasant 37 year old Caucasian female who had an episode of rectal bleeding in July 17.  She had no rectal pain or significant abdominal pain, diarrhea, other GI complaints.  She does have a history of an anal fissure during her pregnancies and also previous hemorrhoids.  Over the last 2 years she's been bothered by endometriosis and apparently has had laparoscopic surgery.  The patient relates her GYN evaluation several months ago was otherwise unremarkable.  She is not on hormonal therapy at this time.  She recently saw primary care and did have a guaiac positive stool.  She has a brother who suffers from Crohn's disease.  She has occasional acid reflux managed by periodic antacids.  She denies chronic abdominal pain, chronic diarrhea or chronic rectal bleeding.  She otherwise is in excellent health her only medication is phentermine 7.5 mg a day for weight loss.  I have reviewed this patient's present history, medical and surgical past history, allergies and medications.     ROS:   All systems were reviewed and are negative unless otherwise stated in the HPI.    Physical Exam: At pressure 110/76, pulse 76 and regular and weight 144. General well developed well nourished patient in no acute distress, appearing their stated age Eyes PERRLA, no icterus, fundoscopic exam per opthamologist Skin no lesions noted Neck supple, no adenopathy, no thyroid enlargement, no tenderness Chest clear to percussion and auscultation Heart no significant murmurs, gallops or rubs noted Abdomen no hepatosplenomegaly masses or tenderness, BS normal.  Rectal inspection normal no fissures, or fistulae noted.  No masses or tenderness on digital exam. Stool guaiac negative. Extremities no acute joint lesions, edema, phlebitis or evidence of cellulitis. Neurologic patient oriented x 3, cranial nerves intact, no focal neurologic deficits noted. Psychological mental status normal  and normal affect.  Assessment and plan: Episode of rectal bleeding 10 days ago which has resolved with initiation of a regular menstrual cycle the patient does have a past history of endometriosis..  She may have some minor colonic involvement of endometriosis, rule out colon polyps, I do not think she has  symptoms consistent with inflammatory bowel disease.  I have set her up for diagnostic colonoscopy and we will proceed accordingly.

## 2012-11-25 NOTE — Patient Instructions (Signed)
You have been scheduled for a colonoscopy with propofol. Please follow written instructions given to you at your visit today.  Please pick up your prep kit at the pharmacy within the next 1-3 days. If you use inhalers (even only as needed), please bring them with you on the day of your procedure. Your physician has requested that you go to www.startemmi.com and enter the access code given to you at your visit today. This web site gives a general overview about your procedure. However, you should still follow specific instructions given to you by our office regarding your preparation for the procedure.                                                We are excited to introduce MyChart, a new best-in-class service that provides you online access to important information in your electronic medical record. We want to make it easier for you to view your health information - all in one secure location - when and where you need it. We expect MyChart will enhance the quality of care and service we provide.  When you register for MyChart, you can:    View your test results.    Request appointments and receive appointment reminders via email.    Request medication renewals.    View your medical history, allergies, medications and immunizations.    Communicate with your physician's office through a password-protected site.    Conveniently print information such as your medication lists.  To find out if MyChart is right for you, please talk to a member of our clinical staff today. We will gladly answer your questions about this free health and wellness tool.  If you are age 38 or older and want a member of your family to have access to your record, you must provide written consent by completing a proxy form available at our office. Please speak to our clinical staff about guidelines regarding accounts for patients younger than age 18.  As you activate your MyChart account and need any technical  assistance, please call the MyChart technical support line at [phone] ([phone]) or email your question to [email or web address]. If you email your question(s), please include your name, a return phone number and the best time to reach you.  If you have non-urgent health-related questions, you can send a message to our office through MyChart at [email or web address]. If you have a medical emergency, call 911.  Thank you for using MyChart as your new health and wellness resource!   MyChart licensed from [employer],  1999-2010. Patents Pending.   

## 2012-12-16 ENCOUNTER — Encounter: Payer: BC Managed Care – PPO | Admitting: Gastroenterology

## 2013-01-06 ENCOUNTER — Encounter: Payer: BC Managed Care – PPO | Admitting: Gastroenterology

## 2013-03-04 ENCOUNTER — Other Ambulatory Visit: Payer: Self-pay

## 2013-04-21 ENCOUNTER — Encounter: Payer: BC Managed Care – PPO | Admitting: Internal Medicine

## 2013-06-15 ENCOUNTER — Ambulatory Visit: Payer: BC Managed Care – PPO | Admitting: Internal Medicine

## 2013-06-17 ENCOUNTER — Ambulatory Visit (INDEPENDENT_AMBULATORY_CARE_PROVIDER_SITE_OTHER): Payer: BC Managed Care – PPO | Admitting: Internal Medicine

## 2013-06-17 ENCOUNTER — Encounter: Payer: Self-pay | Admitting: Internal Medicine

## 2013-06-17 VITALS — BP 100/68 | HR 64 | Temp 98.0°F | Wt 158.0 lb

## 2013-06-17 DIAGNOSIS — E663 Overweight: Secondary | ICD-10-CM

## 2013-06-17 MED ORDER — PHENTERMINE HCL 37.5 MG PO CAPS: 37.5000 mg | ORAL_CAPSULE | ORAL | Status: DC

## 2013-06-17 NOTE — Progress Notes (Signed)
   Subjective:    Patient ID: Sara Howell, female    DOB: 1975/05/11, 38 y.o.   MRN: [id number]  HPI Still has occasional rectal bleeding---always with her periods Seems like it may be endometriosis there Feels things have been worse since she had her ovary removed Has gyn appt coming up Didn't have the colonoscopy  Ran out of the phentermine Weight is up 13# since the last visit Did notice some tachyphylaxis on the phentermine this past time  Current Outpatient Prescriptions on File Prior to Visit  Medication Sig Dispense Refill  . phentermine 37.5 MG capsule Take 37.5 mg by mouth every morning.        No current facility-administered medications on file prior to visit.    Allergies  Allergen Reactions  . Corn-Containing Products   . Eggs Or Egg-Derived Products     Large amounts causes tightness in chest  . Peanut-Containing Drug Products     Past Medical History  Diagnosis Date  . PONV (postoperative nausea and vomiting)   . PVC (premature ventricular contraction)     no meds no cardiologist routinely  . Hypothyroidism 10/12    post partum only, no further need for meds  . Anal fissure   . Anemia   . Anxiety   . Depression     Past Surgical History  Procedure Laterality Date  . Cervical discectomy  2007    No specified location  . Muscle biopsy  as a teenager    non specific; biopsy  . Nsvd      x 2  . Ganglion cyst excision Left 2011  . Wisdom tooth extraction    . Laparoscopy  09/12/2011    Procedure: LAPAROSCOPY OPERATIVE;  Surgeon: Marylynn Pearson, MD;  Location: [facility];  Service: Gynecology;  Laterality: Right;  right oophorectomy    Family History  Problem Relation Age of Onset  . Hepatitis Father     Hep C  . Hypertension Father   . Hypothyroidism Sister   . Crohn's disease Brother   . Asthma Brother   . Diabetes Paternal Grandfather   . Leukemia Paternal Grandfather   . Seizures Maternal Aunt     Epilepsy  . Seizures Maternal Uncle    Epilepsy  . Liver cancer Maternal Uncle     History   Social History  . Marital Status: Married    Spouse Name: N/A    Number of Children: 3  . Years of Education: N/A   Occupational History  . Sales    Social History Main Topics  . Smoking status: Never Smoker   . Smokeless tobacco: Never Used  . Alcohol Use: Yes     Comment: Rarely  . Drug Use: No  . Sexual Activity: Not on file   Other Topics Concern  . Not on file   Social History Narrative   Pt gets regular exercise with cardio 2 days a week and 3 classes a week.   Diet consists of fruits and veggies, doesn't like meat much and likes soda and sweet tea.   Review of Systems May get more irritated easier when on the phentermine    Objective:   Physical Exam  Constitutional: She appears well-developed and well-nourished. No distress.  Psychiatric: She has a normal mood and affect. Her behavior is normal.          Assessment & Plan:

## 2013-06-17 NOTE — Progress Notes (Signed)
Pre-visit discussion using our clinic review tool. No additional management support is needed unless otherwise documented below in the visit note.  

## 2013-06-17 NOTE — Assessment & Plan Note (Addendum)
Has worsened off the phentermine Mild side effects---will try the 30mg  dose to see if that works and is better tolerated Could consider topiramate as well Spent 15 minutes in counseling and planning  Changed back to the 37.5 She cuts in half and takes bid 30mg  only comes in capsule

## 2013-06-18 ENCOUNTER — Telehealth: Payer: Self-pay

## 2013-06-18 MED ORDER — PHENTERMINE HCL 37.5 MG PO TABS: 18.7500 mg | ORAL_TABLET | Freq: Every day | ORAL | Status: DC

## 2013-06-18 NOTE — Telephone Encounter (Signed)
Please call pharmacy and change to tablets I was actively trying to prescribe the tablets but must have hit the wrong choice

## 2013-06-18 NOTE — Telephone Encounter (Signed)
Spoke with pharmacist and advised results   

## 2013-06-18 NOTE — Telephone Encounter (Signed)
Pt left v/m;pt seen 06/17/13 and pts phentermine rx was for a capsule and pt needs tablet so pt can break tab in half and take 1/2 tab twice a day.pt said she will not be able to pick up another prescription today and request [facility] notified to change to tabs.pt request cb. Spoke with Crystal at [facility]; pt did pick up phentermine capsule rx but if pt brings back phentermine capsules pt can pick up tablets and pharmacy can take change from capsules to tabs over phone.Please advise.

## 2013-12-29 ENCOUNTER — Encounter: Payer: Self-pay | Admitting: Internal Medicine

## 2013-12-29 ENCOUNTER — Ambulatory Visit (INDEPENDENT_AMBULATORY_CARE_PROVIDER_SITE_OTHER): Payer: BC Managed Care – PPO | Admitting: Internal Medicine

## 2013-12-29 ENCOUNTER — Encounter: Payer: Self-pay | Admitting: *Deleted

## 2013-12-29 VITALS — BP 102/60 | HR 60 | Temp 98.0°F | Ht 64.0 in | Wt 156.0 lb

## 2013-12-29 DIAGNOSIS — E039 Hypothyroidism, unspecified: Secondary | ICD-10-CM

## 2013-12-29 DIAGNOSIS — E663 Overweight: Secondary | ICD-10-CM

## 2013-12-29 DIAGNOSIS — Z0000 Encounter for general adult medical examination without abnormal findings: Secondary | ICD-10-CM

## 2013-12-29 MED ORDER — PHENTERMINE HCL 37.5 MG PO TABS: 18.7500 mg | ORAL_TABLET | Freq: Every day | ORAL | Status: DC

## 2013-12-29 NOTE — Assessment & Plan Note (Signed)
Generally healthy Discussed fitness

## 2013-12-29 NOTE — Progress Notes (Signed)
Pre visit review using our clinic review tool, if applicable. No additional management support is needed unless otherwise documented below in the visit note. 

## 2013-12-29 NOTE — Assessment & Plan Note (Signed)
Being treated by the gyn NP Will try to get those records to review labs

## 2013-12-29 NOTE — Progress Notes (Signed)
Subjective:    Patient ID: Sara Howell, female    DOB: [birth year], 38 y.o.   MRN: [id number]  HPI Here for physical  Doing okay Went off the phentermine--had only been cutting them in half [facility] Has done Weight Watchers and other plans Does get help with the phentermine--less appetite  Variable with her exercise It may disturb her sleep though Thinks she would like it intermittently  Went to [facility] NP Needed to start on levothyroxine-- ~March Got recheck recently and levels were okay Had repeat left ovary ultrasound May need hysterectomy---endometriosis  No current outpatient prescriptions on file prior to visit.   No current facility-administered medications on file prior to visit.    Allergies  Allergen Reactions  . Corn-Containing Products   . Eggs Or Egg-Derived Products     Large amounts causes tightness in chest  . Peanut-Containing Drug Products     Past Medical History  Diagnosis Date  . PONV (postoperative nausea and vomiting)   . PVC (premature ventricular contraction)     no meds no cardiologist routinely  . Hypothyroidism 10/12    post partum only, no further need for meds  . Anal fissure   . Anemia   . Anxiety   . Depression     Past Surgical History  Procedure Laterality Date  . Cervical discectomy  2007    No specified location  . Muscle biopsy  as a teenager    non specific; biopsy  . Nsvd      x 2  . Ganglion cyst excision Left 2011  . Wisdom tooth extraction    . Laparoscopy  09/12/2011    Procedure: LAPAROSCOPY OPERATIVE;  Surgeon: Marylynn Pearson, MD;  Location: [facility];  Service: Gynecology;  Laterality: Right;  right oophorectomy    Family History  Problem Relation Age of Onset  . Hepatitis Father     Hep C  . Hypertension Father   . Hypothyroidism Sister   . Crohn's disease Brother   . Asthma Brother   . Diabetes Paternal Grandfather   . Leukemia Paternal Grandfather   . Seizures  Maternal Aunt     Epilepsy  . Seizures Maternal Uncle     Epilepsy  . Liver cancer Maternal Uncle     History   Social History  . Marital Status: Married    Spouse Name: N/A    Number of Children: 3  . Years of Education: N/A   Occupational History  . Sales    Social History Main Topics  . Smoking status: Never Smoker   . Smokeless tobacco: Never Used  . Alcohol Use: Yes     Comment: Rarely  . Drug Use: No  . Sexual Activity: Not on file   Other Topics Concern  . Not on file   Social History Narrative   Pt gets regular exercise with cardio 2 days a week and 3 classes a week.   Diet consists of fruits and veggies, doesn't like meat much and likes soda and sweet tea.   Review of Systems  Constitutional: Negative for fatigue and unexpected weight change.       Wears seat belt  HENT: Positive for hearing loss. Negative for dental problem and tinnitus.        Mild hearing loss Regular with dentist  Eyes: Positive for visual disturbance.       Some floaters No diplopia or unilateral vision loss  Respiratory: Positive for cough.  Negative for chest tightness and shortness of breath.        Allergy cough  Cardiovascular: Positive for palpitations. Negative for chest pain and leg swelling.       Occ skips (has PVCs)---like with increased caffeine or stress  Gastrointestinal: Positive for nausea and blood in stool. Negative for vomiting and abdominal pain.       Heartburn at times-- uses tums once a week or so  Endocrine: Negative for polydipsia and polyuria.  Genitourinary: Positive for dyspareunia. Negative for dysuria and hematuria.       Heavy periods still   Musculoskeletal: Positive for back pain. Negative for arthralgias and joint swelling.  Skin: Negative for rash.       No suspicious lesions  Allergic/Immunologic: Positive for environmental allergies. Negative for immunocompromised state.       Uses OTC meds prn  Neurological: Positive for dizziness and  headaches. Negative for syncope, weakness, light-headedness and numbness.       Dizzy if turns head quickly Uses tylenol/advil together for headaches-- twice a week  Psychiatric/Behavioral: Negative for sleep disturbance and dysphoric mood. The patient is not nervous/anxious.        Some hormonal mood swings       Objective:   Physical Exam  Constitutional: She is oriented to person, place, and time. She appears well-developed and well-nourished. No distress.  HENT:  Head: Normocephalic and atraumatic.  Right Ear: External ear normal.  Left Ear: External ear normal.  Mouth/Throat: Oropharynx is clear and moist. No oropharyngeal exudate.  Eyes: Conjunctivae and EOM are normal. Pupils are equal, round, and reactive to light.  Neck: Normal range of motion. Neck supple. No thyromegaly present.  Cardiovascular: Normal rate, regular rhythm, normal heart sounds and intact distal pulses.  Exam reveals no gallop.   No murmur heard. Pulmonary/Chest: Effort normal and breath sounds normal. No respiratory distress. She has no wheezes. She has no rales.  Abdominal: Soft. There is no tenderness.  Musculoskeletal: She exhibits no edema and no tenderness.  Lymphadenopathy:    She has no cervical adenopathy.  Neurological: She is alert and oriented to person, place, and time.  Skin: No rash noted. No erythema.  Psychiatric: She has a normal mood and affect. Her behavior is normal.          Assessment & Plan:

## 2013-12-29 NOTE — Assessment & Plan Note (Signed)
She wants to go back on low dose phentermine (1/2 daily or every other day)

## 2014-02-11 ENCOUNTER — Other Ambulatory Visit: Payer: Self-pay

## 2014-06-29 ENCOUNTER — Other Ambulatory Visit: Payer: Self-pay | Admitting: Internal Medicine

## 2014-06-29 NOTE — Telephone Encounter (Signed)
Last seen 06/17/2013.  Phentermine refill request, last filled 12/29/2013.  Please advise.

## 2014-06-30 NOTE — Telephone Encounter (Signed)
Approved: Okay #[phone] x 5 

## 2014-06-30 NOTE — Telephone Encounter (Signed)
Phentermine called into [facility] per Dr Silvio Pate approval.

## 2014-09-09 ENCOUNTER — Encounter: Payer: Self-pay | Admitting: Internal Medicine

## 2014-09-09 ENCOUNTER — Encounter: Payer: Self-pay | Admitting: *Deleted

## 2014-09-09 ENCOUNTER — Ambulatory Visit (INDEPENDENT_AMBULATORY_CARE_PROVIDER_SITE_OTHER): Payer: BLUE CROSS/BLUE SHIELD | Admitting: Internal Medicine

## 2014-09-09 VITALS — BP 110/70 | HR 55 | Temp 97.6°F | Wt 148.0 lb

## 2014-09-09 DIAGNOSIS — R1030 Lower abdominal pain, unspecified: Secondary | ICD-10-CM | POA: Diagnosis not present

## 2014-09-09 DIAGNOSIS — R102 Pelvic and perineal pain: Secondary | ICD-10-CM | POA: Insufficient documentation

## 2014-09-09 DIAGNOSIS — R1024 Suprapubic pain: Secondary | ICD-10-CM | POA: Insufficient documentation

## 2014-09-09 LAB — POCT URINALYSIS DIPSTICK
BILIRUBIN UA: NEGATIVE
Blood, UA: NEGATIVE
GLUCOSE UA: NEGATIVE
Ketones, UA: NEGATIVE
Leukocytes, UA: NEGATIVE
Nitrite, UA: NEGATIVE
PH UA: 6
SPEC GRAV UA: 1.015
UROBILINOGEN UA: NEGATIVE

## 2014-09-09 MED ORDER — CLINDAMYCIN HCL 300 MG PO CAPS: 300.0000 mg | ORAL_CAPSULE | Freq: Three times a day (TID) | ORAL | Status: DC

## 2014-09-09 NOTE — Patient Instructions (Signed)
If you get severe pain again, you should probably go to the ER where they can do further testing

## 2014-09-09 NOTE — Assessment & Plan Note (Addendum)
Since yesterday No suspect foods but pain does seem worse with defecation (then eases) No pain over bowel though and no appendix or gallbladder tenderness Urine is negative  I am concerned about early vaginal infection due to the indwelling device she used to protect against incontinence when running Has cervical tenderness but is monogamous with husband (low risk with situation and age) Not toxic shock but with pain and loose stools--will treat empirically with clindamycin (but only wore for 1.5 hours so this also seems unlikely) If just colitis--hopefully should abate on its own May need further eval with imaging if persists (though not consistent with abscess or diverticulitis)

## 2014-09-09 NOTE — Progress Notes (Signed)
Pre visit review using our clinic review tool, if applicable. No additional management support is needed unless otherwise documented below in the visit note. 

## 2014-09-09 NOTE — Progress Notes (Signed)
Subjective:    Patient ID: Sara Howell, female    DOB: 20-Dec-1975, 39 y.o.   MRN: [id number]  HPI Having some trouble with abdominal pain  Has had some gas pain before Last night had severe stabbing pain--like her gas Tried her gas med Going on all night Passing gas and now several stools that cause the pain Feels "that everything is going to fall out"  Sharp pain starts lower--than up towards chest Better now--- moved bowels 3 times Comes on in waves Pain bad while moving bowels but then it feels better No blood in stool--only with wiping  Nauseated at lunch after having [employer] burger---she never really eats this type of food Salad and chili for dinner  Feels warm No clear fever No chills or sweats last night  Current Outpatient Prescriptions on File Prior to Visit  Medication Sig Dispense Refill  . levothyroxine (SYNTHROID, LEVOTHROID) 50 MCG tablet Take 50 mcg by mouth daily before breakfast.      No current facility-administered medications on file prior to visit.    Allergies  Allergen Reactions  . Corn-Containing Products   . Eggs Or Egg-Derived Products     Large amounts causes tightness in chest  . Peanut-Containing Drug Products     Past Medical History  Diagnosis Date  . PONV (postoperative nausea and vomiting)   . PVC (premature ventricular contraction)     no meds no cardiologist routinely  . Hypothyroidism 10/12    post partum only, no further need for meds  . Anal fissure   . Anemia   . Anxiety   . Depression     Past Surgical History  Procedure Laterality Date  . Cervical discectomy  2007    No specified location  . Muscle biopsy  as a teenager    non specific; biopsy  . Nsvd      x 2  . Ganglion cyst excision Left 2011  . Wisdom tooth extraction    . Laparoscopy  09/12/2011    Procedure: LAPAROSCOPY OPERATIVE;  Surgeon: Marylynn Pearson, MD;  Location: [facility];  Service: Gynecology;  Laterality: Right;  right oophorectomy     Family History  Problem Relation Age of Onset  . Hepatitis Father     Hep C  . Hypertension Father   . Hypothyroidism Sister   . Crohn's disease Brother   . Asthma Brother   . Diabetes Paternal Grandfather   . Leukemia Paternal Grandfather   . Seizures Maternal Aunt     Epilepsy  . Seizures Maternal Uncle     Epilepsy  . Liver cancer Maternal Uncle     History   Social History  . Marital Status: Married    Spouse Name: N/A  . Number of Children: 3  . Years of Education: N/A   Occupational History  . Sales    Social History Main Topics  . Smoking status: Never Smoker   . Smokeless tobacco: Never Used  . Alcohol Use: Yes     Comment: Rarely  . Drug Use: No  . Sexual Activity: Not on file   Other Topics Concern  . Not on file   Social History Narrative   Pt gets regular exercise with cardio 2 days a week and 3 classes a week.   Diet consists of fruits and veggies, doesn't like meat much and likes soda and sweet tea.   Review of Systems No dysuria or hematuria Did start a new product intravaginally while running---to prevent leakage (  taken out right after running) LMP 4/27---normal for her but they are a little early  Weight stable    Objective:   Physical Exam  Constitutional: She appears well-developed and well-nourished. No distress.  Neck: Normal range of motion. Neck supple. No thyromegaly present.  Pulmonary/Chest: Effort normal and breath sounds normal. No respiratory distress. She has no wheezes. She has no rales.  Abdominal: Soft. Bowel sounds are normal. She exhibits no distension and no mass. There is tenderness. There is guarding. There is no rebound.  Has guarding and tenderness suprapubic No rebound No RUQ or RLQ tenderness of note  Genitourinary:  Normal introitus No discharge Has cervical motion tenderness and pain with bimanual uterine palpation  Lymphadenopathy:    She has no cervical adenopathy.          Assessment & Plan:

## 2014-10-20 ENCOUNTER — Telehealth: Payer: Self-pay | Admitting: Obstetrics and Gynecology

## 2014-10-20 NOTE — Telephone Encounter (Signed)
PT CALLED AND SHE WAS SEEN ON 09/22/14 FOR HER AE, INSURANCE COVED THE EXAM PART BUT NOT THE LAB WORK BECAUSE IT WAS CODED AS ROUTINE, AND INSURANCE IS TELLING HER THAT THE LABS NEEDED TO BE RE-CODED TO MEDICAL. SHE HAD A THYROID PANEL AND A VITAMIN D DRAWN, SHE IS SUPPOSE TO COME BACK IN FOR ANOTHER THYROID PANEL BUT SHE DOESN'T WANT TO COME IN UNTIL THIS IS FIXED SO SHE DOESNT GET ANOTHER BILL. SO SHE WANTED TO KNOW IF YOU COULD RE CODE IT.

## 2014-10-20 NOTE — Telephone Encounter (Signed)
SEE BELOW.

## 2014-10-20 NOTE — Telephone Encounter (Signed)
Please resubmit lab with the following codes- 796.4 abnormal thyroid findings And 268.9 vitamin d deficiency

## 2014-10-20 NOTE — Telephone Encounter (Signed)
Hey can you fix this or to whom do i send this to, thanks

## 2014-10-26 ENCOUNTER — Ambulatory Visit (INDEPENDENT_AMBULATORY_CARE_PROVIDER_SITE_OTHER): Payer: BLUE CROSS/BLUE SHIELD | Admitting: Internal Medicine

## 2014-10-26 ENCOUNTER — Encounter: Payer: Self-pay | Admitting: Internal Medicine

## 2014-10-26 ENCOUNTER — Encounter: Payer: Self-pay | Admitting: *Deleted

## 2014-10-26 VITALS — BP 120/68 | HR 52 | Temp 98.2°F | Wt 150.0 lb

## 2014-10-26 DIAGNOSIS — E663 Overweight: Secondary | ICD-10-CM

## 2014-10-26 DIAGNOSIS — E039 Hypothyroidism, unspecified: Secondary | ICD-10-CM

## 2014-10-26 DIAGNOSIS — E038 Other specified hypothyroidism: Secondary | ICD-10-CM | POA: Diagnosis not present

## 2014-10-26 LAB — TSH: TSH: 4.82 u[IU]/mL — AB (ref 0.35–4.50)

## 2014-10-26 LAB — T4, FREE: Free T4: 0.62 ng/dL (ref 0.60–1.60)

## 2014-10-26 MED ORDER — PHENTERMINE HCL 37.5 MG PO TABS: 37.5000 mg | ORAL_TABLET | Freq: Every day | ORAL | Status: DC

## 2014-10-26 NOTE — Assessment & Plan Note (Signed)
Will go back on the phentermine She uses low dose and skips days Will refill in 6 months and just see yearly

## 2014-10-26 NOTE — Assessment & Plan Note (Signed)
I haven't seen the numbers from gyn but I suspect she really doesn't need Rx If free T4 low, or TSH over 10--would try armour thyroid

## 2014-10-26 NOTE — Progress Notes (Signed)
Subjective:    Patient ID: Sara Howell, female    DOB: Nov 18, 1975, 39 y.o.   MRN: [id number]  HPI Here for follow up of weight issues  Did get over the abdominal pain issue Thinks she may have overdone chili and beans, etc  Keeps up with gyn Didn't need pap this year Vitamin D was low---is taking supplement  She didn't feel well on the thyroid replacement She stopped it herself Got dizziness and strange feelings   Not on the phentermine again Doesn't take it regularly so ran out some months ago It does help so she wants to be back on it  No current outpatient prescriptions on file prior to visit.   No current facility-administered medications on file prior to visit.    Allergies  Allergen Reactions  . Corn-Containing Products   . Eggs Or Egg-Derived Products     Large amounts causes tightness in chest  . Peanut-Containing Drug Products     Past Medical History  Diagnosis Date  . PONV (postoperative nausea and vomiting)   . PVC (premature ventricular contraction)     no meds no cardiologist routinely  . Hypothyroidism 10/12    post partum only, no further need for meds  . Anal fissure   . Anemia   . Anxiety   . Depression     Past Surgical History  Procedure Laterality Date  . Cervical discectomy  2007    No specified location  . Muscle biopsy  as a teenager    non specific; biopsy  . Nsvd      x 2  . Ganglion cyst excision Left 2011  . Wisdom tooth extraction    . Laparoscopy  09/12/2011    Procedure: LAPAROSCOPY OPERATIVE;  Surgeon: Marylynn Pearson, MD;  Location: [facility];  Service: Gynecology;  Laterality: Right;  right oophorectomy    Family History  Problem Relation Age of Onset  . Hepatitis Father     Hep C  . Hypertension Father   . Hypothyroidism Sister   . Crohn's disease Brother   . Asthma Brother   . Diabetes Paternal Grandfather   . Leukemia Paternal Grandfather   . Seizures Maternal Aunt     Epilepsy  . Seizures Maternal Uncle       Epilepsy  . Liver cancer Maternal Uncle     History   Social History  . Marital Status: Married    Spouse Name: N/A  . Number of Children: 3  . Years of Education: N/A   Occupational History  . Sales    Social History Main Topics  . Smoking status: Never Smoker   . Smokeless tobacco: Never Used  . Alcohol Use: Yes     Comment: Rarely  . Drug Use: No  . Sexual Activity: Not on file   Other Topics Concern  . Not on file   Social History Narrative   Pt gets regular exercise with cardio 2 days a week and 3 classes a week.   Diet consists of fruits and veggies, doesn't like meat much and likes soda and sweet tea.   Review of Systems  Sleeps okay--does wake more when on the phentermine Bowels are fine Dry skin--but not bad and no major change No change in nails. Chronic fine hair--she is just careful     Objective:   Physical Exam  Constitutional: She appears well-developed and well-nourished. No distress.  Neck: Normal range of motion. Neck supple. No thyromegaly present.  Cardiovascular: Normal rate,  regular rhythm and normal heart sounds.  Exam reveals no gallop.   No murmur heard. Pulmonary/Chest: Effort normal and breath sounds normal. No respiratory distress. She has no wheezes. She has no rales.  Musculoskeletal: She exhibits no edema.  Lymphadenopathy:    She has no cervical adenopathy.  Psychiatric: She has a normal mood and affect. Her behavior is normal.          Assessment & Plan:

## 2014-10-26 NOTE — Progress Notes (Signed)
Pre visit review using our clinic review tool, if applicable. No additional management support is needed unless otherwise documented below in the visit note. 

## 2014-11-16 NOTE — Telephone Encounter (Signed)
I fixed this with [facility] billing. It will be refiled to insurance today with new dx codes r89.9 and e55.9.Thanks

## 2015-01-04 ENCOUNTER — Encounter: Payer: BC Managed Care – PPO | Admitting: Internal Medicine

## 2015-04-18 ENCOUNTER — Ambulatory Visit: Payer: BLUE CROSS/BLUE SHIELD | Admitting: Internal Medicine

## 2015-04-27 ENCOUNTER — Ambulatory Visit: Payer: BLUE CROSS/BLUE SHIELD | Admitting: Internal Medicine

## 2015-05-16 ENCOUNTER — Other Ambulatory Visit: Payer: Self-pay | Admitting: Internal Medicine

## 2015-05-16 NOTE — Telephone Encounter (Signed)
rx called into pharmacy

## 2015-05-16 NOTE — Telephone Encounter (Signed)
10/26/2014 

## 2015-05-16 NOTE — Telephone Encounter (Signed)
Approved: #[phone] x 5 

## 2015-08-03 DIAGNOSIS — J0100 Acute maxillary sinusitis, unspecified: Secondary | ICD-10-CM | POA: Diagnosis not present

## 2015-08-03 DIAGNOSIS — J069 Acute upper respiratory infection, unspecified: Secondary | ICD-10-CM | POA: Diagnosis not present

## 2015-09-26 ENCOUNTER — Ambulatory Visit (INDEPENDENT_AMBULATORY_CARE_PROVIDER_SITE_OTHER): Payer: BLUE CROSS/BLUE SHIELD | Admitting: Obstetrics and Gynecology

## 2015-09-26 ENCOUNTER — Other Ambulatory Visit: Payer: Self-pay | Admitting: Obstetrics and Gynecology

## 2015-09-26 ENCOUNTER — Encounter: Payer: Self-pay | Admitting: Obstetrics and Gynecology

## 2015-09-26 VITALS — BP 116/75 | HR 61 | Ht 65.0 in | Wt 149.4 lb

## 2015-09-26 DIAGNOSIS — E039 Hypothyroidism, unspecified: Secondary | ICD-10-CM

## 2015-09-26 DIAGNOSIS — Z01419 Encounter for gynecological examination (general) (routine) without abnormal findings: Secondary | ICD-10-CM

## 2015-09-26 DIAGNOSIS — E038 Other specified hypothyroidism: Secondary | ICD-10-CM | POA: Diagnosis not present

## 2015-09-26 DIAGNOSIS — E559 Vitamin D deficiency, unspecified: Secondary | ICD-10-CM | POA: Diagnosis not present

## 2015-09-26 NOTE — Progress Notes (Signed)
Subjective:   Sara Howell is a 39 y.o. G23P3003 Caucasian female here for a routine well-woman exam.  Patient's last menstrual period was 09/23/2015.    Current complaints: none PCP: [place]       Does need & desire labs  Social History: Sexual: heterosexual Marital Status: married Living situation: with family Occupation: homemaker Tobacco/alcohol: no tobacco use Illicit drugs: no history of illicit drug use  The following portions of the patient's history were reviewed and updated as appropriate: allergies, current medications, past family history, past medical history, past social history, past surgical history and problem list.  Past Medical History Past Medical History  Diagnosis Date  . PONV (postoperative nausea and vomiting)   . PVC (premature ventricular contraction)     no meds no cardiologist routinely  . Hypothyroidism 10/12    post partum only, no further need for meds  . Anal fissure   . Anemia   . Anxiety   . Depression     Past Surgical History Past Surgical History  Procedure Laterality Date  . Cervical discectomy  2007    No specified location  . Muscle biopsy  as a teenager    non specific; biopsy  . Nsvd      x 2  . Ganglion cyst excision Left 2011  . Wisdom tooth extraction    . Laparoscopy  09/12/2011    Procedure: LAPAROSCOPY OPERATIVE;  Surgeon: Marylynn Pearson, MD;  Location: [facility];  Service: Gynecology;  Laterality: Right;  right oophorectomy    Gynecologic History G3P3003  Patient's last menstrual period was 09/23/2015. Contraception: tubal ligation Last Pap: 2015. Results were: normal   Obstetric History OB History  Gravida Para Term Preterm AB SAB TAB Ectopic Multiple Living  3 3 3       3     # Outcome Date GA Lbr Len/2nd Weight Sex Delivery Anes PTL Lv  3 Term           2 Term           1 Term               Current Medications Current Outpatient Prescriptions on File Prior to Visit  Medication Sig Dispense Refill  .  phentermine (ADIPEX-P) 37.5 MG tablet TAKE 1 TABLET BY MOUTH DAILY BEFORE BREAKFAST 30 tablet 5   No current facility-administered medications on file prior to visit.    Review of Systems Patient denies any headaches, blurred vision, shortness of breath, chest pain, abdominal pain, problems with bowel movements, urination, or intercourse.  Objective:  BP 116/75 mmHg  Pulse 61  Ht 5\' 5"  (1.651 m)  Wt 149 lb 6.4 oz (67.767 kg)  BMI 24.86 kg/m2  LMP 09/23/2015 Physical Exam  General:  Well developed, well nourished, no acute distress. She is alert and oriented x3. Skin:  Warm and dry Neck:  Midline trachea, no thyromegaly or nodules Cardiovascular: Regular rate and rhythm, no murmur heard Lungs:  Effort normal, all lung fields clear to auscultation bilaterally Breasts:  No dominant palpable mass, retraction, or nipple discharge Abdomen:  Soft, non tender, no hepatosplenomegaly or masses Pelvic:  External genitalia is normal in appearance.  The vagina is normal in appearance. The cervix is bulbous, no CMT.  Thin prep pap is done with HR HPV cotesting. Uterus is felt to be normal size, shape, and contour.  No adnexal masses or tenderness noted. Extremities:  No swelling or varicosities noted Psych:  She has a normal mood and affect  Assessment:   Healthy well-woman exam Subclinical hypothyroidism H/o vit. D Deficiency  Plan:  Labs obtained F/U 1 year for AE, or sooner if needed Mammogram scheduled or sooner if problems  Sara Howell, CNM

## 2015-09-26 NOTE — Patient Instructions (Signed)
Place annual gynecologic exam patient instructions here.

## 2015-09-27 LAB — COMPREHENSIVE METABOLIC PANEL
A/G RATIO: 2 (ref 1.2–2.2)
ALT: 14 IU/L (ref 0–32)
AST: 23 IU/L (ref 0–40)
Albumin: 4.4 g/dL (ref 3.5–5.5)
Alkaline Phosphatase: 53 IU/L (ref 39–117)
BILIRUBIN TOTAL: 0.6 mg/dL (ref 0.0–1.2)
BUN/Creatinine Ratio: 18 (ref 9–23)
BUN: 14 mg/dL (ref 6–24)
CO2: 24 mmol/L (ref 18–29)
Calcium: 9 mg/dL (ref 8.7–10.2)
Chloride: 103 mmol/L (ref 96–106)
Creatinine, Ser: 0.79 mg/dL (ref 0.57–1.00)
GFR, EST AFRICAN AMERICAN: 108 mL/min/{1.73_m2} (ref >59)
GFR, EST NON AFRICAN AMERICAN: 94 mL/min/{1.73_m2} (ref >59)
Globulin, Total: 2.2 g/dL (ref 1.5–4.5)
Glucose: 84 mg/dL (ref 65–99)
POTASSIUM: 4 mmol/L (ref 3.5–5.2)
Sodium: 141 mmol/L (ref 134–144)
TOTAL PROTEIN: 6.6 g/dL (ref 6.0–8.5)

## 2015-09-27 LAB — LIPID PANEL
CHOL/HDL RATIO: 2.6 ratio (ref 0.0–4.4)
Cholesterol, Total: 115 mg/dL (ref 100–199)
HDL: 44 mg/dL (ref >39)
LDL CALC: 63 mg/dL (ref 0–99)
TRIGLYCERIDES: 39 mg/dL (ref 0–149)
VLDL Cholesterol Cal: 8 mg/dL (ref 5–40)

## 2015-09-27 LAB — THYROID PANEL WITH TSH
Free Thyroxine Index: 1.9 (ref 1.2–4.9)
T3 UPTAKE RATIO: 29 % (ref 24–39)
T4, Total: 6.5 ug/dL (ref 4.5–12.0)
TSH: 5.23 u[IU]/mL — ABNORMAL HIGH (ref 0.450–4.500)

## 2015-09-27 LAB — VITAMIN D 25 HYDROXY (VIT D DEFICIENCY, FRACTURES): Vit D, 25-Hydroxy: 25.4 ng/mL — ABNORMAL LOW (ref 30.0–100.0)

## 2015-09-28 ENCOUNTER — Other Ambulatory Visit: Payer: Self-pay | Admitting: Obstetrics and Gynecology

## 2015-09-28 DIAGNOSIS — E559 Vitamin D deficiency, unspecified: Secondary | ICD-10-CM

## 2015-09-28 LAB — CYTOLOGY - PAP

## 2015-09-28 MED ORDER — VITAMIN D (ERGOCALCIFEROL) 1.25 MG (50000 UNIT) PO CAPS: 50000.0000 [IU] | ORAL_CAPSULE | ORAL | Status: DC

## 2015-09-29 ENCOUNTER — Telehealth: Payer: Self-pay | Admitting: *Deleted

## 2015-09-29 NOTE — Telephone Encounter (Signed)
-----   Message from Joylene Igo, [place] sent at 09/28/2015  6:10 PM EDT ----- Please mail info on Vit D Defieciency

## 2015-09-29 NOTE — Telephone Encounter (Signed)
Mailed info on Vit D

## 2015-10-04 ENCOUNTER — Encounter: Payer: Self-pay | Admitting: Internal Medicine

## 2015-10-04 ENCOUNTER — Ambulatory Visit (INDEPENDENT_AMBULATORY_CARE_PROVIDER_SITE_OTHER): Payer: BLUE CROSS/BLUE SHIELD | Admitting: Internal Medicine

## 2015-10-04 VITALS — BP 116/72 | HR 61 | Temp 98.2°F | Wt 151.0 lb

## 2015-10-04 DIAGNOSIS — E038 Other specified hypothyroidism: Secondary | ICD-10-CM | POA: Diagnosis not present

## 2015-10-04 DIAGNOSIS — E663 Overweight: Secondary | ICD-10-CM | POA: Diagnosis not present

## 2015-10-04 DIAGNOSIS — E039 Hypothyroidism, unspecified: Secondary | ICD-10-CM

## 2015-10-04 MED ORDER — THYROID 32.5 MG PO TABS: 32.5000 mg | ORAL_TABLET | Freq: Every day | ORAL | Status: DC

## 2015-10-04 NOTE — Assessment & Plan Note (Signed)
Still uses the phentermine occasionally

## 2015-10-04 NOTE — Progress Notes (Signed)
Pre visit review using our clinic review tool, if applicable. No additional management support is needed unless otherwise documented below in the visit note. 

## 2015-10-04 NOTE — Progress Notes (Signed)
Subjective:    Patient ID: Sara Howell, female    DOB: [birth year], 40 y.o.   MRN: [id number]  HPI Here due to concern about her thyroid  Recent gyn visit Concern about the TSH going higher Still noticing hair loss Concerned with cycling weight Still with heavy periods  Has been working out more regularly--this may have reduced the flow some Still feels tired Was on synthroid for about a year--- but though it seemed better at first, then felt worse  Current Outpatient Prescriptions on File Prior to Visit  Medication Sig Dispense Refill  . phentermine (ADIPEX-P) 37.5 MG tablet TAKE 1 TABLET BY MOUTH DAILY BEFORE BREAKFAST 30 tablet 5  . Vitamin D, Ergocalciferol, (DRISDOL) 50000 units CAPS capsule Take 1 capsule (50,000 Units total) by mouth every 7 (seven) days. 30 capsule 1   No current facility-administered medications on file prior to visit.    Allergies  Allergen Reactions  . Corn-Containing Products   . Eggs Or Egg-Derived Products     Large amounts causes tightness in chest  . Peanut-Containing Drug Products     Past Medical History  Diagnosis Date  . PONV (postoperative nausea and vomiting)   . PVC (premature ventricular contraction)     no meds no cardiologist routinely  . Hypothyroidism 10/12    post partum only, no further need for meds  . Anal fissure   . Anemia   . Anxiety   . Depression     Past Surgical History  Procedure Laterality Date  . Cervical discectomy  2007    No specified location  . Muscle biopsy  as a teenager    non specific; biopsy  . Nsvd      x 2  . Ganglion cyst excision Left 2011  . Wisdom tooth extraction    . Laparoscopy  09/12/2011    Procedure: LAPAROSCOPY OPERATIVE;  Surgeon: Marylynn Pearson, MD;  Location: [facility];  Service: Gynecology;  Laterality: Right;  right oophorectomy    Family History  Problem Relation Age of Onset  . Hepatitis Father     Hep C  . Hypertension Father   . Hypothyroidism Sister   .  Crohn's disease Brother   . Asthma Brother   . Diabetes Paternal Grandfather   . Leukemia Paternal Grandfather   . Seizures Maternal Aunt     Epilepsy  . Seizures Maternal Uncle     Epilepsy  . Liver cancer Maternal Uncle     Social History   Social History  . Marital Status: Married    Spouse Name: N/A  . Number of Children: 3  . Years of Education: N/A   Occupational History  . Sales    Social History Main Topics  . Smoking status: Never Smoker   . Smokeless tobacco: Never Used  . Alcohol Use: Yes     Comment: Rarely  . Drug Use: No  . Sexual Activity: Yes   Other Topics Concern  . Not on file   Social History Narrative   Pt gets regular exercise with cardio 2 days a week and 3 classes a week.   Diet consists of fruits and veggies, doesn't like meat much and likes soda and sweet tea.   Review of Systems Sleeps okay Appetite is good Occasional palpitations--related to nerves. ?from phentermine (but only takes this once in a while)    Objective:   Physical Exam  Constitutional: She appears well-developed and well-nourished. No distress.  Psychiatric: She has a normal  mood and affect. Her behavior is normal.          Assessment & Plan:

## 2015-10-04 NOTE — Assessment & Plan Note (Addendum)
Has multiple symptoms that are non specific Probably worth trying different Rx since she didn't do great with synthroid (after a while at least) Will try low dose Armour thyroid Counseling/care plan for the entire 15 minute visit

## 2015-10-05 ENCOUNTER — Telehealth: Payer: Self-pay

## 2015-10-05 NOTE — Telephone Encounter (Signed)
Spoke to Ibk at [facility]. She said they can get Naturthroid easier. That is what they will fill

## 2015-10-05 NOTE — Telephone Encounter (Signed)
Margaretha Sheffield pharmacist at [facility] left v/m; the armour thyroid comes in 32 mg; the nature thyroid is 32.5 mg. Which do you want pt to have.Uhah request cb.

## 2015-10-05 NOTE — Telephone Encounter (Signed)
Either is actually fine I had intended the Armour--but whatever he gets easiest and can be consistent with

## 2015-10-07 ENCOUNTER — Other Ambulatory Visit: Payer: Self-pay | Admitting: Obstetrics and Gynecology

## 2015-10-07 DIAGNOSIS — Z1231 Encounter for screening mammogram for malignant neoplasm of breast: Secondary | ICD-10-CM

## 2015-10-18 ENCOUNTER — Ambulatory Visit
Admission: RE | Admit: 2015-10-18 | Discharge: 2015-10-18 | Disposition: A | Discharge status: Home / Self Care | Payer: BLUE CROSS/BLUE SHIELD | Source: Ambulatory Visit | Attending: Obstetrics and Gynecology | Admitting: Obstetrics and Gynecology

## 2015-10-18 DIAGNOSIS — Z1231 Encounter for screening mammogram for malignant neoplasm of breast: Secondary | ICD-10-CM

## 2015-11-22 ENCOUNTER — Other Ambulatory Visit: Payer: BLUE CROSS/BLUE SHIELD

## 2015-12-14 ENCOUNTER — Encounter: Payer: Self-pay | Admitting: Internal Medicine

## 2015-12-14 ENCOUNTER — Ambulatory Visit (INDEPENDENT_AMBULATORY_CARE_PROVIDER_SITE_OTHER): Payer: BLUE CROSS/BLUE SHIELD | Admitting: Internal Medicine

## 2015-12-14 VITALS — BP 112/76 | HR 55 | Temp 98.5°F | Wt 151.5 lb

## 2015-12-14 DIAGNOSIS — E039 Hypothyroidism, unspecified: Secondary | ICD-10-CM

## 2015-12-14 DIAGNOSIS — R590 Localized enlarged lymph nodes: Secondary | ICD-10-CM

## 2015-12-14 LAB — T4, FREE: FREE T4: 0.63 ng/dL (ref 0.60–1.60)

## 2015-12-14 LAB — CBC WITH DIFFERENTIAL/PLATELET
BASOS ABS: 0 10*3/uL (ref 0.0–0.1)
BASOS PCT: 0.4 % (ref 0.0–3.0)
EOS ABS: 0.1 10*3/uL (ref 0.0–0.7)
Eosinophils Relative: 3.2 % (ref 0.0–5.0)
HEMATOCRIT: 39 % (ref 36.0–46.0)
HEMOGLOBIN: 13.5 g/dL (ref 12.0–15.0)
LYMPHS PCT: 32 % (ref 12.0–46.0)
Lymphs Abs: 1.4 10*3/uL (ref 0.7–4.0)
MCHC: 34.6 g/dL (ref 30.0–36.0)
MCV: 92.9 fl (ref 78.0–100.0)
MONO ABS: 0.3 10*3/uL (ref 0.1–1.0)
Monocytes Relative: 7.8 % (ref 3.0–12.0)
Neutro Abs: 2.4 10*3/uL (ref 1.4–7.7)
Neutrophils Relative %: 56.6 % (ref 43.0–77.0)
Platelets: 220 10*3/uL (ref 150.0–400.0)
RBC: 4.2 Mil/uL (ref 3.87–5.11)
RDW: 13.1 % (ref 11.5–15.5)
WBC: 4.3 10*3/uL (ref 4.0–10.5)

## 2015-12-14 LAB — TSH: TSH: 4.27 u[IU]/mL (ref 0.35–4.50)

## 2015-12-14 NOTE — Patient Instructions (Signed)

## 2015-12-14 NOTE — Progress Notes (Signed)
Subjective:    Patient ID: Sara Howell, female    DOB: 1976/03/05, 40 y.o.   MRN: [id number]  HPI  Pt presents to the clinic today with complaints of a lump under the right side of her chin and on the right side of her neck. She noticed this yesterday. She reports for a week prior, she has had runny nose, sore throat. She also reports that she's been having stabbing pains in her ears. She denies fever, chills or body aches. She has taking Tylenol over-the-counter with good relief.  She also wants to have her thyroid rechecked. She reports that she was on Synthroid but was feeling more fatigued. Her PCP switched her to Armour Thyroid, but she reports she started feeling very jittery and shaky so she has not taking any medication in the last few weeks. Overall she is feeling fatigued and not herself and think she needs to restart the Synthroid.   Review of Systems      Past Medical History:  Diagnosis Date  . Anal fissure   . Anemia   . Anxiety   . Depression   . Hypothyroidism 10/12   post partum only, no further need for meds  . PONV (postoperative nausea and vomiting)   . PVC (premature ventricular contraction)    no meds no cardiologist routinely    Current Outpatient Prescriptions  Medication Sig Dispense Refill  . phentermine (ADIPEX-P) 37.5 MG tablet TAKE 1 TABLET BY MOUTH DAILY BEFORE BREAKFAST (Patient not taking: Reported on 12/14/2015) 30 tablet 5  . thyroid (NATURE-THROID) 32.5 MG tablet Take 32.5 mg by mouth daily.    . Vitamin D, Ergocalciferol, (DRISDOL) 50000 units CAPS capsule Take 1 capsule (50,000 Units total) by mouth every 7 (seven) days. (Patient not taking: Reported on 12/14/2015) 30 capsule 1   No current facility-administered medications for this visit.     Allergies  Allergen Reactions  . Corn-Containing Products   . Eggs Or Egg-Derived Products     Large amounts causes tightness in chest  . Peanut-Containing Drug Products     Family History    Problem Relation Age of Onset  . Hepatitis Father     Hep C  . Hypertension Father   . Hypothyroidism Sister   . Crohn's disease Brother   . Asthma Brother   . Diabetes Paternal Grandfather   . Leukemia Paternal Grandfather   . Seizures Maternal Aunt     Epilepsy  . Seizures Maternal Uncle     Epilepsy  . Liver cancer Maternal Uncle     Social History   Social History  . Marital status: Married    Spouse name: N/A  . Number of children: 3  . Years of education: N/A   Occupational History  . Sales    Social History Main Topics  . Smoking status: Never Smoker  . Smokeless tobacco: Never Used  . Alcohol use Yes     Comment: Rarely  . Drug use: No  . Sexual activity: Yes   Other Topics Concern  . Not on file   Social History Narrative   Pt gets regular exercise with cardio 2 days a week and 3 classes a week.   Diet consists of fruits and veggies, doesn't like meat much and likes soda and sweet tea.     Constitutional: Pt reports fatigue. Denies fever, malaise, headache or abrupt weight changes.  HEENT: Pt reports runny nose, sore throat. Denies eye pain, eye redness, ear pain, ringing in  the ears, wax buildup, nasal congestion, bloody nose. Respiratory: Denies difficulty breathing, shortness of breath, cough or sputum production.   Cardiovascular: Denies chest pain, chest tightness, palpitations or swelling in the hands or feet.  Neurological: Denies dizziness, difficulty with memory, difficulty with speech or problems with balance and coordination.    No other specific complaints in a complete review of systems (except as listed in HPI above).  Objective:   Physical Exam    BP 112/76   Pulse (!) 55   Temp 98.5 F (36.9 C) (Oral)   Wt 151 lb 8 oz (68.7 kg)   LMP 11/18/2015   SpO2 98%   BMI 25.21 kg/m  Wt Readings from Last 3 Encounters:  12/14/15 151 lb 8 oz (68.7 kg)  10/04/15 151 lb (68.5 kg)  09/26/15 149 lb 6.4 oz (67.8 kg)    General:  Appears her stated age, well developed, well nourished in NAD. Skin: Warm, dry and intact.  HEENT: Head: normal shape and size; Eyes: sclera white, no icterus, conjunctiva pink, PERRLA and EOMs intact; Ears: Tm's gray and intact, normal light reflex; Throat/Mouth: Teeth present, mucosa pink and moist, no exudate, lesions or ulcerations noted.  Neck:  Cervical adenopathy, L>R. Cardiovascular: Normal rate and rhythm. S1,S2 noted.  No murmur, rubs or gallops noted. Pulmonary/Chest: Normal effort and positive vesicular breath sounds. No respiratory distress. No wheezes, rales or ronchi noted.  Neurological: Alert and oriented.    BMET    Component Value Date/Time   NA 141 09/26/2015 0922   K 4.0 09/26/2015 0922   CL 103 09/26/2015 0922   CO2 24 09/26/2015 0922   GLUCOSE 84 09/26/2015 0922   GLUCOSE 82 01/08/2012 0920   BUN 14 09/26/2015 0922   CREATININE 0.79 09/26/2015 0922   CALCIUM 9.0 09/26/2015 0922   GFRNONAA 94 09/26/2015 0922   GFRAA 108 09/26/2015 0922    Lipid Panel     Component Value Date/Time   CHOL 115 09/26/2015 0922   TRIG 39 09/26/2015 0922   HDL 44 09/26/2015 0922   CHOLHDL 2.6 09/26/2015 0922   CHOLHDL 4.0 CALC 03/08/2008 0908   VLDL 13 03/08/2008 0908   LDLCALC 63 09/26/2015 0922    CBC    Component Value Date/Time   WBC 4.3 12/14/2015 1206   RBC 4.20 12/14/2015 1206   HGB 13.5 12/14/2015 1206   HCT 39.0 12/14/2015 1206   PLT 220.0 12/14/2015 1206   MCV 92.9 12/14/2015 1206   MCH 32.0 09/04/2011 1502   MCHC 34.6 12/14/2015 1206   RDW 13.1 12/14/2015 1206   LYMPHSABS 1.4 12/14/2015 1206   MONOABS 0.3 12/14/2015 1206   EOSABS 0.1 12/14/2015 1206   BASOSABS 0.0 12/14/2015 1206    Hgb A1C No results found for: HGBA1C        Assessment & Plan:   Cervical Adenopathy:  Likely viral in nature She is requesting CBC today Advised her to take Ibuprofen OTC Will likely resolve on its own  Hypothyroidism:  Will check TSH and T3 today  Will  follow up after labs, RTC as needed.  Webb Silversmith, NP

## 2015-12-20 DIAGNOSIS — L040 Acute lymphadenitis of face, head and neck: Secondary | ICD-10-CM | POA: Diagnosis not present

## 2016-01-17 ENCOUNTER — Encounter: Payer: Self-pay | Admitting: Obstetrics and Gynecology

## 2016-01-17 ENCOUNTER — Ambulatory Visit (INDEPENDENT_AMBULATORY_CARE_PROVIDER_SITE_OTHER): Payer: BLUE CROSS/BLUE SHIELD | Admitting: Obstetrics and Gynecology

## 2016-01-17 VITALS — BP 122/80 | HR 98 | Ht 65.0 in | Wt 153.3 lb

## 2016-01-17 DIAGNOSIS — E559 Vitamin D deficiency, unspecified: Secondary | ICD-10-CM | POA: Diagnosis not present

## 2016-01-17 NOTE — Progress Notes (Signed)
Pt is having lots of "mood swings:, she states she can go from 0 to 100 very quickly.  She is having some anxiety associated with this, would like to start medication for this, also she states her periods have been very heavy and she has a lot of cramping associated with them.  However she would like to try medication for mood before adding another medication for her period

## 2016-01-18 ENCOUNTER — Telehealth: Payer: Self-pay | Admitting: Obstetrics and Gynecology

## 2016-01-18 NOTE — Telephone Encounter (Signed)
Pt said you were supposed to call her back about her medications.

## 2016-01-18 NOTE — Telephone Encounter (Signed)
Hey this is the pt I worked up 01/17/16- mood disorder?? We talked about Prozac and low dose xanax pls advise

## 2016-01-19 MED ORDER — FLUOXETINE HCL 10 MG PO TABS: 10.0000 mg | ORAL_TABLET | Freq: Every day | ORAL | 1 refills | Status: DC

## 2016-01-19 MED ORDER — ALPRAZOLAM 0.5 MG PO TABS: 0.5000 mg | ORAL_TABLET | Freq: Every evening | ORAL | 1 refills | Status: DC | PRN

## 2016-01-21 LAB — VITAMIN D 1,25 DIHYDROXY
VITAMIN D 1, 25 (OH) TOTAL: 38 pg/mL
VITAMIN D2 1, 25 (OH): 18 pg/mL
VITAMIN D3 1, 25 (OH): 20 pg/mL

## 2016-03-05 ENCOUNTER — Telehealth: Payer: Self-pay | Admitting: Obstetrics and Gynecology

## 2016-03-05 NOTE — Telephone Encounter (Signed)
Please put on tomorrow's schedule

## 2016-03-05 NOTE — Telephone Encounter (Signed)
pls advise

## 2016-03-05 NOTE — Telephone Encounter (Signed)
PT CALLED AND SHE WANTED TO BE SEEN ASAP DUE TO THE MEDS THAT [facility] PRESCRIBED SHE FEELS ARE NOT WORKING, THE NEXT AVAILABLE APPT IS 03/19/16 SHE DIDN'T WANT THAT, SO SHE WANTED TO SEE IF SOMETHING COULD BE ADDRESSED ON THE PHONE OR DOES SHE HAVE TO COME IN, IF SO WOULD THIS BE A WORK IN SLOT? LET ME KNOW IF I NEED TO SCHEDULE HER.

## 2016-03-05 NOTE — Telephone Encounter (Signed)
Pt coming in 03/06/16 11:15

## 2016-03-06 ENCOUNTER — Ambulatory Visit (INDEPENDENT_AMBULATORY_CARE_PROVIDER_SITE_OTHER): Payer: BLUE CROSS/BLUE SHIELD | Admitting: Obstetrics and Gynecology

## 2016-03-06 ENCOUNTER — Encounter: Payer: Self-pay | Admitting: Obstetrics and Gynecology

## 2016-03-06 VITALS — BP 107/67 | HR 78 | Ht 65.0 in | Wt 158.9 lb

## 2016-03-06 DIAGNOSIS — F32A Depression, unspecified: Secondary | ICD-10-CM

## 2016-03-06 DIAGNOSIS — F329 Major depressive disorder, single episode, unspecified: Secondary | ICD-10-CM | POA: Diagnosis not present

## 2016-03-06 MED ORDER — ESCITALOPRAM OXALATE 10 MG PO TABS: 10.0000 mg | ORAL_TABLET | Freq: Every day | ORAL | 6 refills | Status: DC

## 2016-03-06 MED ORDER — VITAMIN D (ERGOCALCIFEROL) 1.25 MG (50000 UNIT) PO CAPS: 50000.0000 [IU] | ORAL_CAPSULE | ORAL | 1 refills | Status: DC

## 2016-03-06 NOTE — Progress Notes (Signed)
Subjective:     Patient ID: Sara Howell, female   DOB: April 15, 1976, 40 y.o.   MRN: [id number]  HPI Patient has been on Prozac since Sept. States that a lot has happened with work and life since then she feels that the medication is not working for her. Cannot take the Xanax because it makes her sleepy. Has not been taking Vitamin D after she ran out. Reports yesterday symptoms worse. Has feelings of being alone, helpless and hopeless. Stress eating has increased and therefore weight has increased. Reports she does sleep well and doesn't want a medication that will interrupt sleep. Feels overwhelmed daily, and like she has no-one to talk to.   Has tried other forms of stress relief such as exercise and reports that it works well but states she is unable to find the time with her job, kid and wife duties.   States she feels out of control and unsure if at this time she would hurt herself or others if she got upset.   Has been on Lexapro before when she had postpartum depression and reports it worked well other than a decreased libido.  Depression screen PHQ 2/9 03/06/2016  Decreased Interest 1  Down, Depressed, Hopeless 2  PHQ - 2 Score 3  Altered sleeping 0  Tired, decreased energy 1  Change in appetite 1  Feeling bad or failure about yourself  1  Trouble concentrating 0  Moving slowly or fidgety/restless 0  Suicidal thoughts 1  PHQ-9 Score 7    Review of Systems  All negative except what is mentioned in HPI.      Objective:   Physical Exam Blood pressure 107/67, pulse 78, height 5\' 5"  (1.651 m), weight 158 lb 14.4 oz (72.1 kg), last menstrual period 02/12/2016. PE not indicated    Assessment:     Depression and Anxiety H/O vitamin d deficiency    Plan:     Stop Prozac. Will switch to Lexapro and restart Vitamin D. Discussed starting an exercise routine back for stress relief. Discussed starting counseling to help learn coping mechanisms. Referral to [facility]  ordered. Contracted with patient to report progress in 2-3 weeks or sooner if worse. Follow up in 6 weeks.  >50% of 25 minute visit spent in counseling and medication management.    Thea Gist, RN, Student FNP Lorelle Gibbs, CNM

## 2016-03-14 DIAGNOSIS — J0100 Acute maxillary sinusitis, unspecified: Secondary | ICD-10-CM | POA: Diagnosis not present

## 2016-03-14 DIAGNOSIS — R05 Cough: Secondary | ICD-10-CM | POA: Diagnosis not present

## 2016-04-10 ENCOUNTER — Telehealth: Payer: Self-pay | Admitting: Obstetrics and Gynecology

## 2016-04-10 ENCOUNTER — Other Ambulatory Visit: Payer: Self-pay | Admitting: Obstetrics and Gynecology

## 2016-04-10 MED ORDER — ESCITALOPRAM OXALATE 20 MG PO TABS: 20.0000 mg | ORAL_TABLET | Freq: Every day | ORAL | 5 refills | Status: DC

## 2016-04-10 NOTE — Telephone Encounter (Signed)
PT CALLED AND SHE WOULD LIKE A CALL BACK, SHE WANTED TO SEE IF SHE CAN GET THE MG INCREASE ON HER LEXAPRO. SHE IS CURRENTLY ON 10 MG ONCE A DAY  AND SHE WANTED TO SEE IF SHE CAN DO 20MG  ONE A DAY.

## 2016-04-10 NOTE — Telephone Encounter (Signed)
Notified pt. 

## 2016-04-10 NOTE — Telephone Encounter (Signed)
Please let her know I sent in a new rx for 20 mg today, she can double her current dose to use them up

## 2016-04-17 ENCOUNTER — Encounter: Payer: BLUE CROSS/BLUE SHIELD | Admitting: Obstetrics and Gynecology

## 2016-06-07 ENCOUNTER — Encounter: Payer: Self-pay | Admitting: Primary Care

## 2016-06-07 ENCOUNTER — Ambulatory Visit (INDEPENDENT_AMBULATORY_CARE_PROVIDER_SITE_OTHER): Payer: BLUE CROSS/BLUE SHIELD | Admitting: Primary Care

## 2016-06-07 VITALS — BP 110/70 | HR 66 | Temp 98.1°F | Ht 65.0 in | Wt 166.0 lb

## 2016-06-07 DIAGNOSIS — R21 Rash and other nonspecific skin eruption: Secondary | ICD-10-CM

## 2016-06-07 MED ORDER — TRIAMCINOLONE ACETONIDE 0.1 % EX CREA: 1.0000 "application " | TOPICAL_CREAM | Freq: Two times a day (BID) | CUTANEOUS | 0 refills | Status: DC

## 2016-06-07 NOTE — Progress Notes (Signed)
Pre visit review using our clinic review tool, if applicable. No additional management support is needed unless otherwise documented below in the visit note. 

## 2016-06-07 NOTE — Patient Instructions (Signed)
You may apply Triamcinolone 0.1% cream twice daily to rash until resolve.  Start taking cetirizine (Zyrtec) every night at bedtime for the next 3-4 weeks for rash.   Work on stress reduction through exercise, meditation, enjoyable activities.  Please call me if no improvement in 2 weeks.   It was a pleasure meeting you!

## 2016-06-07 NOTE — Progress Notes (Signed)
Subjective:    Patient ID: Sara Howell, female    DOB: 03-Jun-1975, 41 y.o.   MRN: [id number]  HPI  Sara Howell is a 41 year old female who presents today with a chief complaint of rash. Her rash is intermittent and is located to the bilateral thighs, behind her knees, and lower extremities. Her rash is itchy with erythema and warmth during exacerbations. This has been going on for the past 1 month. She's been using an anti-fungal cream, A&D ointment, and another OTC ointment with temporary improvement until early evening.   She denies changes in soaps, detergents, medications. She has been under increased stress since October 2017 and recently underwent an increase in her Lexapro dose. No one else in her household has had this rash. She's not taken an antihistamine.   Review of Systems  HENT: Negative for congestion and sore throat.   Respiratory: Negative for shortness of breath and wheezing.   Skin: Positive for color change and rash. Negative for wound.  Allergic/Immunologic: Positive for environmental allergies.  Psychiatric/Behavioral: The patient is nervous/anxious.        Past Medical History:  Diagnosis Date  . Anal fissure   . Anemia   . Anxiety   . Depression   . Hypothyroidism 10/12   post partum only, no further need for meds  . PONV (postoperative nausea and vomiting)   . PVC (premature ventricular contraction)    no meds no cardiologist routinely     Social History   Social History  . Marital status: Married    Spouse name: N/A  . Number of children: 3  . Years of education: N/A   Occupational History  . Sales    Social History Main Topics  . Smoking status: Never Smoker  . Smokeless tobacco: Never Used  . Alcohol use Yes     Comment: Rarely  . Drug use: No  . Sexual activity: Yes     Comment: husband vasectomy   Other Topics Concern  . Not on file   Social History Narrative   Pt gets regular exercise with cardio 2 days a week and 3 classes a  week.   Diet consists of fruits and veggies, doesn't like meat much and likes soda and sweet tea.    Past Surgical History:  Procedure Laterality Date  . CERVICAL DISCECTOMY  2007   No specified location  . GANGLION CYST EXCISION Left 2011  . LAPAROSCOPY  09/12/2011   Procedure: LAPAROSCOPY OPERATIVE;  Surgeon: Marylynn Pearson, MD;  Location: [facility];  Service: Gynecology;  Laterality: Right;  right oophorectomy  . MUSCLE BIOPSY  as a teenager   non specific; biopsy  . NSVD     x 2  . WISDOM TOOTH EXTRACTION      Family History  Problem Relation Age of Onset  . Hepatitis Father     Hep C  . Hypertension Father   . Hypothyroidism Sister   . Crohn's disease Brother   . Asthma Brother   . Diabetes Paternal Grandfather   . Leukemia Paternal Grandfather   . Seizures Maternal Aunt     Epilepsy  . Seizures Maternal Uncle     Epilepsy  . Liver cancer Maternal Uncle     Allergies  Allergen Reactions  . Corn-Containing Products   . Eggs Or Egg-Derived Products     Large amounts causes tightness in chest  . Peanut-Containing Drug Products     Current Outpatient Prescriptions on File Prior to  Visit  Medication Sig Dispense Refill  . escitalopram (LEXAPRO) 20 MG tablet Take 1 tablet (20 mg total) by mouth daily. 30 tablet 5  . Vitamin D, Ergocalciferol, (DRISDOL) 50000 units CAPS capsule Take 1 capsule (50,000 Units total) by mouth every 7 (seven) days. 30 capsule 1  . ALPRAZolam (XANAX) 0.5 MG tablet Take 1 tablet (0.5 mg total) by mouth at bedtime as needed for anxiety. (Patient not taking: Reported on 03/06/2016) 30 tablet 1  . thyroid (NATURE-THROID) 32.5 MG tablet Take 32.5 mg by mouth daily.     No current facility-administered medications on file prior to visit.     BP 110/70   Pulse 66   Temp 98.1 F (36.7 C) (Oral)   Ht 5\' 5"  (1.651 m)   Wt 166 lb (75.3 kg)   LMP 06/07/2016   SpO2 98%   BMI 27.62 kg/m    Objective:   Physical Exam  Constitutional: She  appears well-nourished.  Neck: Neck supple.  Cardiovascular: Normal rate and regular rhythm.   Pulmonary/Chest: Effort normal and breath sounds normal.  Skin: Skin is warm and dry.  Small red spot to right popliteal fossa, otherwise no rash evident to body          Assessment & Plan:  Rash:  Intermittent for the past 1 month. Temporary improvement with OTC treatment. Exam today without evidence of hives, contact dermatitis, etc. Suspect some sort of allergic reaction, low suspicion to Lexapro. Also suspect anxiety induced.  Will have her trial Triamcinolone cream during flares for relief and start daily Zyrtec for 4 weeks. Discussed importance of stress reduction. She will update in 2 weeks if no improvement.   Sheral Flow, NP

## 2016-08-27 ENCOUNTER — Encounter: Payer: Self-pay | Admitting: Internal Medicine

## 2016-08-27 ENCOUNTER — Ambulatory Visit (INDEPENDENT_AMBULATORY_CARE_PROVIDER_SITE_OTHER): Payer: BLUE CROSS/BLUE SHIELD | Admitting: Internal Medicine

## 2016-08-27 ENCOUNTER — Ambulatory Visit: Payer: BLUE CROSS/BLUE SHIELD | Admitting: Internal Medicine

## 2016-08-27 VITALS — BP 124/72 | HR 76 | Temp 98.2°F | Wt 171.2 lb

## 2016-08-27 DIAGNOSIS — F39 Unspecified mood [affective] disorder: Secondary | ICD-10-CM | POA: Diagnosis not present

## 2016-08-27 DIAGNOSIS — E663 Overweight: Secondary | ICD-10-CM | POA: Diagnosis not present

## 2016-08-27 DIAGNOSIS — E038 Other specified hypothyroidism: Secondary | ICD-10-CM

## 2016-08-27 DIAGNOSIS — E039 Hypothyroidism, unspecified: Secondary | ICD-10-CM | POA: Diagnosis not present

## 2016-08-27 LAB — CBC WITH DIFFERENTIAL/PLATELET
BASOS ABS: 0 10*3/uL (ref 0.0–0.1)
BASOS PCT: 0.6 % (ref 0.0–3.0)
EOS ABS: 0.1 10*3/uL (ref 0.0–0.7)
Eosinophils Relative: 1.6 % (ref 0.0–5.0)
HEMATOCRIT: 38.5 % (ref 36.0–46.0)
HEMOGLOBIN: 13.3 g/dL (ref 12.0–15.0)
LYMPHS PCT: 30.3 % (ref 12.0–46.0)
Lymphs Abs: 1.4 10*3/uL (ref 0.7–4.0)
MCHC: 34.6 g/dL (ref 30.0–36.0)
MCV: 93.7 fl (ref 78.0–100.0)
Monocytes Absolute: 0.4 10*3/uL (ref 0.1–1.0)
Monocytes Relative: 7.8 % (ref 3.0–12.0)
NEUTROS ABS: 2.8 10*3/uL (ref 1.4–7.7)
Neutrophils Relative %: 59.7 % (ref 43.0–77.0)
Platelets: 199 10*3/uL (ref 150.0–400.0)
RBC: 4.11 Mil/uL (ref 3.87–5.11)
RDW: 12.8 % (ref 11.5–15.5)
WBC: 4.7 10*3/uL (ref 4.0–10.5)

## 2016-08-27 LAB — COMPREHENSIVE METABOLIC PANEL
ALT: 18 U/L (ref 0–35)
AST: 20 U/L (ref 0–37)
Albumin: 4.3 g/dL (ref 3.5–5.2)
Alkaline Phosphatase: 48 U/L (ref 39–117)
BILIRUBIN TOTAL: 0.9 mg/dL (ref 0.2–1.2)
BUN: 13 mg/dL (ref 6–23)
CALCIUM: 9.6 mg/dL (ref 8.4–10.5)
CO2: 29 meq/L (ref 19–32)
Chloride: 106 mEq/L (ref 96–112)
Creatinine, Ser: 0.68 mg/dL (ref 0.40–1.20)
GFR: 101.05 mL/min (ref >60.00)
GLUCOSE: 76 mg/dL (ref 70–99)
POTASSIUM: 4 meq/L (ref 3.5–5.1)
Sodium: 139 mEq/L (ref 135–145)
Total Protein: 6.9 g/dL (ref 6.0–8.3)

## 2016-08-27 LAB — TSH: TSH: 4.56 u[IU]/mL — ABNORMAL HIGH (ref 0.35–4.50)

## 2016-08-27 LAB — T4, FREE: Free T4: 0.62 ng/dL (ref 0.60–1.60)

## 2016-08-27 MED ORDER — ESCITALOPRAM OXALATE 20 MG PO TABS: 10.0000 mg | ORAL_TABLET | Freq: Every day | ORAL | 0 refills | Status: DC

## 2016-08-27 MED ORDER — PHENTERMINE HCL 37.5 MG PO CAPS: 37.5000 mg | ORAL_CAPSULE | ORAL | 5 refills | Status: DC

## 2016-08-27 NOTE — Progress Notes (Signed)
Subjective:    Patient ID: Sara Howell, female    DOB: June 21, 1975, 41 y.o.   MRN: [id number]  HPI Here for follow up of overweight and thyroid issues  Started on lexapro by gyn a few months ago Initially on fluoxetine last fall Lots of stress Gained a lot of weight and is concerned about this  Mood is better on the lexapro Got Rx for alprazolam--but hasn't been using that  Trying to eat healthy Work is more stressful Does eat in evening at times---but often just has gum or hard candy  Wonders about her thyroid again Also interested in restarting the phentermine  Current Outpatient Prescriptions on File Prior to Visit  Medication Sig Dispense Refill  . escitalopram (LEXAPRO) 20 MG tablet Take 1 tablet (20 mg total) by mouth daily. 30 tablet 5  . triamcinolone cream (KENALOG) 0.1 % Apply 1 application topically 2 (two) times daily. 30 g 0   No current facility-administered medications on file prior to visit.     Allergies  Allergen Reactions  . Corn-Containing Products   . Eggs Or Egg-Derived Products     Large amounts causes tightness in chest  . Peanut-Containing Drug Products     Past Medical History:  Diagnosis Date  . Anal fissure   . Anemia   . Anxiety   . Depression   . Hypothyroidism 10/12   post partum only, no further need for meds  . PONV (postoperative nausea and vomiting)   . PVC (premature ventricular contraction)    no meds no cardiologist routinely    Past Surgical History:  Procedure Laterality Date  . CERVICAL DISCECTOMY  2007   No specified location  . GANGLION CYST EXCISION Left 2011  . LAPAROSCOPY  09/12/2011   Procedure: LAPAROSCOPY OPERATIVE;  Surgeon: Marylynn Pearson, MD;  Location: [facility];  Service: Gynecology;  Laterality: Right;  right oophorectomy  . MUSCLE BIOPSY  as a teenager   non specific; biopsy  . NSVD     x 2  . WISDOM TOOTH EXTRACTION      Family History  Problem Relation Age of Onset  . Hepatitis Father    Hep C  . Hypertension Father   . Hypothyroidism Sister   . Crohn's disease Brother   . Asthma Brother   . Diabetes Paternal Grandfather   . Leukemia Paternal Grandfather   . Seizures Maternal Aunt     Epilepsy  . Seizures Maternal Uncle     Epilepsy  . Liver cancer Maternal Uncle     Social History   Social History  . Marital status: Married    Spouse name: N/A  . Number of children: 3  . Years of education: N/A   Occupational History  . Sales    Social History Main Topics  . Smoking status: Never Smoker  . Smokeless tobacco: Never Used  . Alcohol use Yes     Comment: Rarely  . Drug use: No  . Sexual activity: Yes     Comment: husband vasectomy   Other Topics Concern  . Not on file   Social History Narrative   Pt gets regular exercise with cardio 2 days a week and 3 classes a week.   Diet consists of fruits and veggies, doesn't like meat much and likes soda and sweet tea.   Review of Systems  Sleeps okay Continues to walk but not as much time for exercise      Objective:   Physical Exam  Constitutional:  She appears well-developed and well-nourished. No distress.  Neck: No thyromegaly present.  Cardiovascular: Normal rate, regular rhythm and normal heart sounds.  Exam reveals no gallop.   No murmur heard. Pulmonary/Chest: Effort normal and breath sounds normal. No respiratory distress. She has no wheezes. She has no rales.  Abdominal: Soft. There is no tenderness.  Musculoskeletal: She exhibits no edema or tenderness.  Lymphadenopathy:    She has no cervical adenopathy.  Psychiatric: She has a normal mood and affect. Her behavior is normal.          Assessment & Plan:

## 2016-08-27 NOTE — Assessment & Plan Note (Signed)
Not clear that meds are appropriate Will recheck labs

## 2016-08-27 NOTE — Assessment & Plan Note (Signed)
Seems mostly stress related Discussed exercise, taking her vacation time, decompressing Will try decreasing the lexapro--since clearly has added to her weight gain

## 2016-08-27 NOTE — Assessment & Plan Note (Signed)
Clearly worse with the antidepressant use Will go ahead and restart the phentermine Consider adding long acting topiramate

## 2016-08-27 NOTE — Progress Notes (Signed)
Pre visit review using our clinic review tool, if applicable. No additional management support is needed unless otherwise documented below in the visit note. 

## 2016-10-01 ENCOUNTER — Encounter: Payer: BLUE CROSS/BLUE SHIELD | Admitting: Obstetrics and Gynecology

## 2016-10-07 ENCOUNTER — Encounter: Payer: BLUE CROSS/BLUE SHIELD | Admitting: Internal Medicine

## 2016-10-19 ENCOUNTER — Other Ambulatory Visit: Payer: Self-pay | Admitting: Obstetrics and Gynecology

## 2016-12-26 ENCOUNTER — Encounter: Payer: Self-pay | Admitting: Obstetrics and Gynecology

## 2016-12-26 ENCOUNTER — Ambulatory Visit (INDEPENDENT_AMBULATORY_CARE_PROVIDER_SITE_OTHER): Payer: BLUE CROSS/BLUE SHIELD | Admitting: Obstetrics and Gynecology

## 2016-12-26 VITALS — BP 127/87 | HR 77 | Ht 65.0 in | Wt 182.5 lb

## 2016-12-26 DIAGNOSIS — R102 Pelvic and perineal pain: Secondary | ICD-10-CM

## 2016-12-26 DIAGNOSIS — E039 Hypothyroidism, unspecified: Secondary | ICD-10-CM | POA: Diagnosis not present

## 2016-12-26 DIAGNOSIS — R1084 Generalized abdominal pain: Secondary | ICD-10-CM

## 2016-12-26 DIAGNOSIS — E559 Vitamin D deficiency, unspecified: Secondary | ICD-10-CM

## 2016-12-26 DIAGNOSIS — R5383 Other fatigue: Secondary | ICD-10-CM

## 2016-12-26 DIAGNOSIS — R12 Heartburn: Secondary | ICD-10-CM | POA: Diagnosis not present

## 2016-12-26 DIAGNOSIS — R635 Abnormal weight gain: Secondary | ICD-10-CM | POA: Diagnosis not present

## 2016-12-26 DIAGNOSIS — Z01419 Encounter for gynecological examination (general) (routine) without abnormal findings: Secondary | ICD-10-CM | POA: Diagnosis not present

## 2016-12-26 DIAGNOSIS — Z01411 Encounter for gynecological examination (general) (routine) with abnormal findings: Secondary | ICD-10-CM | POA: Diagnosis not present

## 2016-12-26 MED ORDER — RANITIDINE HCL 150 MG PO TABS: 150.0000 mg | ORAL_TABLET | Freq: Two times a day (BID) | ORAL | 11 refills | Status: DC

## 2016-12-26 MED ORDER — ESCITALOPRAM OXALATE 10 MG PO TABS: 10.0000 mg | ORAL_TABLET | Freq: Every day | ORAL | 11 refills | Status: DC

## 2016-12-26 NOTE — Patient Instructions (Signed)
Preventive Care 18-39 Years, Female Preventive care refers to lifestyle choices and visits with your health care provider that can promote health and wellness. What does preventive care include?  A yearly physical exam. This is also called an annual well check.  Dental exams once or twice a year.  Routine eye exams. Ask your health care provider how often you should have your eyes checked.  Personal lifestyle choices, including: ? Daily care of your teeth and gums. ? Regular physical activity. ? Eating a healthy diet. ? Avoiding tobacco and drug use. ? Limiting alcohol use. ? Practicing safe sex. ? Taking vitamin and mineral supplements as recommended by your health care provider. What happens during an annual well check? The services and screenings done by your health care provider during your annual well check will depend on your age, overall health, lifestyle risk factors, and family history of disease. Counseling Your health care provider may ask you questions about your:  Alcohol use.  Tobacco use.  Drug use.  Emotional well-being.  Home and relationship well-being.  Sexual activity.  Eating habits.  Work and work Statistician.  Method of birth control.  Menstrual cycle.  Pregnancy history.  Screening You may have the following tests or measurements:  Height, weight, and BMI.  Diabetes screening. This is done by checking your blood sugar (glucose) after you have not eaten for a while (fasting).  Blood pressure.  Lipid and cholesterol levels. These may be checked every 5 years starting at age 38.  Skin check.  Hepatitis C blood test.  Hepatitis B blood test.  Sexually transmitted disease (STD) testing.  BRCA-related cancer screening. This may be done if you have a family history of breast, ovarian, tubal, or peritoneal cancers.  Pelvic exam and Pap test. This may be done every 3 years starting at age 38. Starting at age 30, this may be done  every 5 years if you have a Pap test in combination with an HPV test.  Discuss your test results, treatment options, and if necessary, the need for more tests with your health care provider. Vaccines Your health care provider may recommend certain vaccines, such as:  Influenza vaccine. This is recommended every year.  Tetanus, diphtheria, and acellular pertussis (Tdap, Td) vaccine. You may need a Td booster every 10 years.  Varicella vaccine. You may need this if you have not been vaccinated.  HPV vaccine. If you are 39 or younger, you may need three doses over 6 months.  Measles, mumps, and rubella (MMR) vaccine. You may need at least one dose of MMR. You may also need a second dose.  Pneumococcal 13-valent conjugate (PCV13) vaccine. You may need this if you have certain conditions and were not previously vaccinated.  Pneumococcal polysaccharide (PPSV23) vaccine. You may need one or two doses if you smoke cigarettes or if you have certain conditions.  Meningococcal vaccine. One dose is recommended if you are age 68-21 years and a first-year college student living in a residence hall, or if you have one of several medical conditions. You may also need additional booster doses.  Hepatitis A vaccine. You may need this if you have certain conditions or if you travel or work in places where you may be exposed to hepatitis A.  Hepatitis B vaccine. You may need this if you have certain conditions or if you travel or work in places where you may be exposed to hepatitis B.  Haemophilus influenzae type b (Hib) vaccine. You may need this  if you have certain risk factors.  Talk to your health care provider about which screenings and vaccines you need and how often you need them. This information is not intended to replace advice given to you by your health care provider. Make sure you discuss any questions you have with your health care provider. Document Released: 06/11/2001 Document Revised:  01/03/2016 Document Reviewed: 02/14/2015 Elsevier Interactive Patient Education  2017 Elsevier Inc.  

## 2016-12-26 NOTE — Progress Notes (Signed)
Subjective:   Sara Howell is a 41 y.o. G41P3003 Caucasian female here for a routine well-woman exam.  Patient's last menstrual period was 12/21/2016.    Current complaints: weight gain, depression better, travels a lot with work, back pains worse since weight gain, walking occasionally. Daily heartburn, has tried zantac and it helps. Also generalized abdominal pain and lower pelvic pain, progressively getting worse especially with menses making her concerned about endometriosis coming back. PCP: Lubertha Sayres       Does need labs  Social History: Sexual: heterosexual Marital Status: married Living situation: with family Occupation: [employer] Tobacco/alcohol: no tobacco use Illicit drugs: no history of illicit drug use  The following portions of the patient's history were reviewed and updated as appropriate: allergies, current medications, past family history, past medical history, past social history, past surgical history and problem list.  Past Medical History Past Medical History:  Diagnosis Date  . Anal fissure   . Anemia   . Anxiety   . Depression   . Hypothyroidism 10/12   post partum only, no further need for meds  . PONV (postoperative nausea and vomiting)   . PVC (premature ventricular contraction)    no meds no cardiologist routinely    Past Surgical History Past Surgical History:  Procedure Laterality Date  . CERVICAL DISCECTOMY  2007   No specified location  . GANGLION CYST EXCISION Left 2011  . LAPAROSCOPY  09/12/2011   Procedure: LAPAROSCOPY OPERATIVE;  Surgeon: Marylynn Pearson, MD;  Location: [facility];  Service: Gynecology;  Laterality: Right;  right oophorectomy  . MUSCLE BIOPSY  as a teenager   non specific; biopsy  . NSVD     x 2  . WISDOM TOOTH EXTRACTION      Gynecologic History G3P3003  Patient's last menstrual period was 12/21/2016. Contraception: tubal ligation Last Pap: 2017. Results were: normal Last mammogram: 2017. Results were:  normal   Obstetric History OB History  Gravida Para Term Preterm AB Living  3 3 3     3   SAB TAB Ectopic Multiple Live Births               # Outcome Date GA Lbr Len/2nd Weight Sex Delivery Anes PTL Lv  3 Term           2 Term           1 Term               Current Medications Current Outpatient Prescriptions on File Prior to Visit  Medication Sig Dispense Refill  . escitalopram (LEXAPRO) 20 MG tablet Take 0.5 tablets (10 mg total) by mouth daily. 1 tablet 0  . triamcinolone cream (KENALOG) 0.1 % Apply 1 application topically 2 (two) times daily. (Patient not taking: Reported on 12/26/2016) 30 g 0   No current facility-administered medications on file prior to visit.     Review of Systems Patient denies any headaches, blurred vision, shortness of breath, chest pain, , problems with bowel movements, or urination, .  Objective:  BP 127/87   Pulse 77   Ht 5\' 5"  (1.651 m)   Wt 182 lb 8 oz (82.8 kg)   LMP 12/21/2016   BMI 30.37 kg/m  Physical Exam  General:  Well developed, well nourished, no acute distress. She is alert and oriented x3. Busted blood vessel in right eye. Skin:  Warm and dry Neck:  Midline trachea, no thyromegaly or nodules Cardiovascular: Regular rate and rhythm, no murmur heard Lungs:  Effort normal, all lung fields clear to auscultation bilaterally Breasts:  No dominant palpable mass, retraction, or nipple discharge Abdomen:  Soft, generalized tenderness throughout without BS changes, no hepatosplenomegaly or masses Pelvic:  External genitalia is normal in appearance.  The vagina is normal in appearance. The cervix is bulbous, no CMT.  Thin prep pap is not done . Uterus is felt to be normal size, shape, and contour.  No adnexal masses noted.tnderness noted more on right adnexa, but throughout pelvis Extremities:  No swelling or varicosities noted Psych:  She has a normal mood and affect  Assessment:   Healthy well-woman exam H/o endometriosis Pelvic  pain Abdominal pain Hypothyroidism Weight gain Fatigue Heartburn    Plan:  Labs obtained will follow up accordingly. Decreased lexapro to 10 mg daily Added RX for zantac bid. Pelvic and abdominal ultrasound ordered-will follow up accordingly. F/U 1 year for AE, or sooner if needed Mammogram ordered  Henslee Lottman Rockney Ghee, CNM

## 2016-12-28 LAB — COMPREHENSIVE METABOLIC PANEL
ALT: 17 IU/L (ref 0–32)
AST: 24 IU/L (ref 0–40)
Albumin/Globulin Ratio: 1.9 (ref 1.2–2.2)
Albumin: 4.4 g/dL (ref 3.5–5.5)
Alkaline Phosphatase: 53 IU/L (ref 39–117)
BILIRUBIN TOTAL: 0.7 mg/dL (ref 0.0–1.2)
BUN/Creatinine Ratio: 15 (ref 9–23)
BUN: 13 mg/dL (ref 6–24)
CALCIUM: 9 mg/dL (ref 8.7–10.2)
CHLORIDE: 106 mmol/L (ref 96–106)
CO2: 24 mmol/L (ref 20–29)
Creatinine, Ser: 0.84 mg/dL (ref 0.57–1.00)
GFR, EST AFRICAN AMERICAN: 100 mL/min/{1.73_m2} (ref >59)
GFR, EST NON AFRICAN AMERICAN: 87 mL/min/{1.73_m2} (ref >59)
GLUCOSE: 76 mg/dL (ref 65–99)
Globulin, Total: 2.3 g/dL (ref 1.5–4.5)
Potassium: 4.2 mmol/L (ref 3.5–5.2)
Sodium: 143 mmol/L (ref 134–144)
TOTAL PROTEIN: 6.7 g/dL (ref 6.0–8.5)

## 2016-12-28 LAB — THYROID PANEL WITH TSH
FREE THYROXINE INDEX: 1.2 (ref 1.2–4.9)
T3 Uptake Ratio: 24 % (ref 24–39)
T4, Total: 5.1 ug/dL (ref 4.5–12.0)
TSH: 6.69 u[IU]/mL — ABNORMAL HIGH (ref 0.450–4.500)

## 2016-12-28 LAB — CBC
Hematocrit: 38.5 % (ref 34.0–46.6)
Hemoglobin: 13.1 g/dL (ref 11.1–15.9)
MCH: 32.1 pg (ref 26.6–33.0)
MCHC: 34 g/dL (ref 31.5–35.7)
MCV: 94 fL (ref 79–97)
Platelets: 192 10*3/uL (ref 150–379)
RBC: 4.08 x10E6/uL (ref 3.77–5.28)
RDW: 12.8 % (ref 12.3–15.4)
WBC: 3.8 10*3/uL (ref 3.4–10.8)

## 2016-12-28 LAB — B12 AND FOLATE PANEL
Folate: 17.6 ng/mL (ref >3.0)
Vitamin B-12: 270 pg/mL (ref 232–1245)

## 2016-12-28 LAB — VITAMIN D 25 HYDROXY (VIT D DEFICIENCY, FRACTURES): VIT D 25 HYDROXY: 26.8 ng/mL — AB (ref 30.0–100.0)

## 2016-12-28 LAB — THYROID ANTIBODIES
THYROGLOBULIN ANTIBODY: 287.2 [IU]/mL — AB (ref 0.0–0.9)
THYROID PEROXIDASE ANTIBODY: 293 [IU]/mL — AB (ref 0–34)

## 2016-12-28 LAB — GLIA (IGA/G) + TTG IGA
Antigliadin Abs, IgA: 4 units (ref 0–19)
Gliadin IgG: 2 units (ref 0–19)
Transglutaminase IgA: <2 U/mL (ref 0–3)

## 2016-12-28 LAB — IODINE, SERUM/PLASMA: Iodine: 33.3 ug/L — ABNORMAL LOW (ref 40.0–92.0)

## 2016-12-28 LAB — HEMOGLOBIN A1C
ESTIMATED AVERAGE GLUCOSE: 88 mg/dL
Hgb A1c MFr Bld: 4.7 % — ABNORMAL LOW (ref 4.8–5.6)

## 2016-12-28 LAB — IRON: Iron: 101 ug/dL (ref 27–159)

## 2016-12-31 ENCOUNTER — Other Ambulatory Visit: Payer: Self-pay | Admitting: Obstetrics and Gynecology

## 2016-12-31 MED ORDER — VITAMIN D (ERGOCALCIFEROL) 1.25 MG (50000 UNIT) PO CAPS: 50000.0000 [IU] | ORAL_CAPSULE | ORAL | 1 refills | Status: DC

## 2017-01-02 ENCOUNTER — Encounter: Payer: Self-pay | Admitting: Obstetrics and Gynecology

## 2017-01-06 ENCOUNTER — Ambulatory Visit (INDEPENDENT_AMBULATORY_CARE_PROVIDER_SITE_OTHER): Payer: BLUE CROSS/BLUE SHIELD | Admitting: Internal Medicine

## 2017-01-06 ENCOUNTER — Other Ambulatory Visit: Payer: Self-pay | Admitting: *Deleted

## 2017-01-06 ENCOUNTER — Encounter: Payer: Self-pay | Admitting: Internal Medicine

## 2017-01-06 VITALS — BP 120/70 | HR 57 | Temp 98.0°F | Wt 183.8 lb

## 2017-01-06 DIAGNOSIS — E039 Hypothyroidism, unspecified: Secondary | ICD-10-CM

## 2017-01-06 DIAGNOSIS — R7989 Other specified abnormal findings of blood chemistry: Secondary | ICD-10-CM

## 2017-01-06 DIAGNOSIS — E038 Other specified hypothyroidism: Secondary | ICD-10-CM

## 2017-01-06 MED ORDER — LEVOTHYROXINE SODIUM 25 MCG PO TABS: 25.0000 ug | ORAL_TABLET | Freq: Every day | ORAL | 1 refills | Status: DC

## 2017-01-06 NOTE — Assessment & Plan Note (Signed)
She is concerned that she is symptomatic from this now Will be going to the endocrinologist but wants to start Rx now Could have Hashimoto's with the autoimmunity--- and decreasing function Will restart levothyroxine  Also concerned about hypoglycemic type symptoms Glucose was 76 but A1c low Increased weight, etc not suggestive of insulinoma and glucose not low---can review this with endocrinologist also

## 2017-01-06 NOTE — Progress Notes (Signed)
Subjective:    Patient ID: Sara Howell, female    DOB: March 19, 1976, 41 y.o.   MRN: [id number]  HPI Here for follow up of weight problems and stress Tried the phentermine--found she got agitated on it With all her stress at work--it made things worse Stopped this  Still concerned about her thyroid Had a full panel of thyroid work---TSH borderline elevated Antibody levels are elevated---hard to tell what this means She is concerned about hypoglycemic symptoms--- has to cut back on exercise due to symptoms Going to endocrinologist--but wants to start Rx now and not wait  Still on the lexapro Weight gain has corresponded with starting this Didn't have problems with weight gain when taking this post partum  Current Outpatient Prescriptions on File Prior to Visit  Medication Sig Dispense Refill  . escitalopram (LEXAPRO) 10 MG tablet Take 1 tablet (10 mg total) by mouth daily. 30 tablet 11  . ranitidine (ZANTAC) 150 MG tablet Take 1 tablet (150 mg total) by mouth 2 (two) times daily. 60 tablet 11  . triamcinolone cream (KENALOG) 0.1 % Apply 1 application topically 2 (two) times daily. 30 g 0  . Vitamin D, Ergocalciferol, (DRISDOL) 50000 units CAPS capsule Take 1 capsule (50,000 Units total) by mouth 2 (two) times a week. 30 capsule 1   No current facility-administered medications on file prior to visit.     Allergies  Allergen Reactions  . Corn-Containing Products   . Eggs Or Egg-Derived Products     Large amounts causes tightness in chest  . Peanut-Containing Drug Products     Past Medical History:  Diagnosis Date  . Anal fissure   . Anemia   . Anxiety   . Depression   . Hypothyroidism 10/12   post partum only, no further need for meds  . PONV (postoperative nausea and vomiting)   . PVC (premature ventricular contraction)    no meds no cardiologist routinely    Past Surgical History:  Procedure Laterality Date  . CERVICAL DISCECTOMY  2007   No specified location    . GANGLION CYST EXCISION Left 2011  . LAPAROSCOPY  09/12/2011   Procedure: LAPAROSCOPY OPERATIVE;  Surgeon: Marylynn Pearson, MD;  Location: [facility];  Service: Gynecology;  Laterality: Right;  right oophorectomy  . MUSCLE BIOPSY  as a teenager   non specific; biopsy  . NSVD     x 2  . WISDOM TOOTH EXTRACTION      Family History  Problem Relation Age of Onset  . Hepatitis Father        Hep C  . Hypertension Father   . Hypothyroidism Sister   . Crohn's disease Brother   . Asthma Brother   . Diabetes Paternal Grandfather   . Leukemia Paternal Grandfather   . Seizures Maternal Aunt        Epilepsy  . Seizures Maternal Uncle        Epilepsy  . Liver cancer Maternal Uncle     Social History   Social History  . Marital status: Married    Spouse name: N/A  . Number of children: 3  . Years of education: N/A   Occupational History  . Retail banker--- [employer]   Social History Main Topics  . Smoking status: Never Smoker  . Smokeless tobacco: Never Used  . Alcohol use Yes     Comment: Rarely  . Drug use: No  . Sexual activity: Yes  Comment: husband vasectomy   Other Topics Concern  . Not on file   Social History Narrative   Pt gets regular exercise with cardio 2 days a week and 3 classes a week.   Diet consists of fruits and veggies, doesn't like meat much and likes soda and sweet tea.   Review of Systems Weight continues to go up Sleeps fairly well--but doesn't feel rested    Objective:   Physical Exam  Constitutional: No distress.  Psychiatric: She has a normal mood and affect. Her behavior is normal.          Assessment & Plan:

## 2017-01-29 ENCOUNTER — Telehealth: Payer: Self-pay | Admitting: Obstetrics and Gynecology

## 2017-01-29 ENCOUNTER — Other Ambulatory Visit: Payer: Self-pay | Admitting: Obstetrics and Gynecology

## 2017-01-29 DIAGNOSIS — R102 Pelvic and perineal pain: Secondary | ICD-10-CM

## 2017-01-29 NOTE — Telephone Encounter (Signed)
ERROR

## 2017-01-30 DIAGNOSIS — N390 Urinary tract infection, site not specified: Secondary | ICD-10-CM | POA: Diagnosis not present

## 2017-01-30 DIAGNOSIS — M545 Low back pain: Secondary | ICD-10-CM | POA: Diagnosis not present

## 2017-02-06 ENCOUNTER — Ambulatory Visit (INDEPENDENT_AMBULATORY_CARE_PROVIDER_SITE_OTHER): Payer: BLUE CROSS/BLUE SHIELD

## 2017-02-06 DIAGNOSIS — R102 Pelvic and perineal pain: Secondary | ICD-10-CM | POA: Diagnosis not present

## 2017-03-10 ENCOUNTER — Ambulatory Visit: Payer: BLUE CROSS/BLUE SHIELD | Admitting: Internal Medicine

## 2017-03-10 ENCOUNTER — Encounter: Payer: Self-pay | Admitting: Internal Medicine

## 2017-03-10 VITALS — BP 100/68 | HR 72 | Temp 98.0°F | Ht 64.75 in | Wt 190.1 lb

## 2017-03-10 DIAGNOSIS — E161 Other hypoglycemia: Secondary | ICD-10-CM

## 2017-03-10 DIAGNOSIS — E038 Other specified hypothyroidism: Secondary | ICD-10-CM

## 2017-03-10 DIAGNOSIS — E039 Hypothyroidism, unspecified: Secondary | ICD-10-CM

## 2017-03-10 DIAGNOSIS — E063 Autoimmune thyroiditis: Secondary | ICD-10-CM | POA: Insufficient documentation

## 2017-03-10 HISTORY — DX: Autoimmune thyroiditis: E06.3

## 2017-03-10 NOTE — Patient Instructions (Addendum)
Please continue levothyroxine 25 mcg daily.  Take the thyroid hormone every day, with water, at least 30 minutes before breakfast, separated by at least 4 hours from: - acid reflux medications - calcium - iron - multivitamins  Please move the Zantac 4h later.   Please start Selenium 200 mcg daily.  Please add carbs with b'fast. No liquids within 30 min from a meal No concentrated carbs (as much as you can) or add them at the end of the meal. Try to move dinner earlier than 7 pm.  Please come back for a follow-up appointment in 6 months, and a lab appt in 5 weeks.

## 2017-03-10 NOTE — Progress Notes (Signed)
Patient ID: Sara Howell, female   DOB: 12-25-1975, 41 y.o.   MRN: [id number]    HPI  Sara Howell is a 41 y.o.-year-old female, referred by her PCP, Dr. Silvio Pate, for management of subclinical hypothyroidism. In the context of Hashimoto's thyroiditis.  Pt. has been dx with hypothyroidism in 2012 after the birth of her son by a Dr. in [place].  Recently, she noticed more fatigue, stressed, gained weight; she had labs checked by her OB/GYN doctor and is returned abnormal.  Thyroid antibodies were also elevated.  She then saw her PCP who recheck the TFTs and they continue to be abnormal and she was started on Levothyroxine 25 mcg - started 11/2016.  She takes the thyroid hormone: - fasting - with water - separated by >30 min from b'fast  - no calcium, iron, multivitamins  - + Zantac in am along with LT4 Cut out coffee.  I reviewed pt's thyroid tests: Lab Results  Component Value Date   TSH 6.690 (H) 12/26/2016   TSH 4.56 (H) 08/27/2016   TSH 4.27 12/14/2015   TSH 5.230 (H) 09/26/2015   TSH 4.82 (H) 10/26/2014   TSH 4.52 01/08/2012   TSH 3.11 01/30/2011   TSH 2.33 07/30/2010   TSH 4.14 03/08/2008   FREET4 0.62 08/27/2016   FREET4 0.63 12/14/2015   FREET4 0.62 10/26/2014   FREET4 0.78 01/08/2012   FREET4 0.74 01/30/2011   FREET4 0.71 07/30/2010    Component     Latest Ref Rng & Units 12/26/2016  Thyroperoxidase Ab SerPl-aCnc     0 - 34 IU/mL 293 (H)  Thyroglobulin Antibody     0.0 - 0.9 IU/mL 287.2 (H)   Pt denies feeling nodules in neck, hoarseness, dysphagia/odynophagia, SOB with lying down.  She has + FH of thyroid disorders in: mother, father, sister >> hypothyroidism. No FH of thyroid cancer.  No h/o radiation tx to head or neck. No recent use of iodine supplements.  Pt. also has a history of perceived reactive hypoglycemia episodes (tremors, palpitations, fatigue) >> sugars not low when checked: Lowest 74. HbA1c 4.7%.  Meals: - Breakfast: [place] sausage with  egg whites or string cheese - snack: fruit - Lunch: Soup and salad - Dinner: Eats out a lot: Mostly salads, chicken, sometimes bread or pizza - Snacks: no  Has PVCs.   ROS: Constitutional: + weight gain, + fatigue, + subjective hyperthermia/no hypothermia Eyes: no blurry vision, no xerophthalmia ENT: no sore throat,+ see HPI Cardiovascular: + CP (stress)/SOB/+ palpitations/+ leg swelling Respiratory: no cough/SOB Gastrointestinal: no N/V/D/C, + acid reflux Musculoskeletal: + muscle/no joint aches Skin: no rashes Neurological: no tremors/numbness/tingling/dizziness, + HA Psychiatric: + both: depression/anxiety + low libido  Past Medical History:  Diagnosis Date  . Anal fissure   . Anemia   . Anxiety   . Depression   . Hypothyroidism 10/12   post partum only, no further need for meds  . PONV (postoperative nausea and vomiting)   . PVC (premature ventricular contraction)    no meds no cardiologist routinely  History of metabolic myopathy  Past Surgical History:  Procedure Laterality Date  . CERVICAL DISCECTOMY  2007   L4, L5 vertebrae  . GANGLION CYST EXCISION Left 2011  . MUSCLE BIOPSY  as a teenager   non specific; biopsy  . NSVD     x 2  . WISDOM TOOTH EXTRACTION     Social History   Socioeconomic History  . Marital status: Married    Spouse name: Not  on file  . Number of children: 3  . Years of education: Not on file  . Highest education level: Not on file  Social Needs  . Financial resource strain: Not on file  . Food insecurity - worry: Not on file  . Food insecurity - inability: Not on file  . Transportation needs - medical: Not on file  . Transportation needs - non-medical: Not on file  Occupational History  . Occupation: Tree surgeon    Comment: Inside sales--- [employer]  Tobacco Use  . Smoking status: Never Smoker  . Smokeless tobacco: Never Used  Substance and Sexual Activity  . Alcohol use: Yes    Comment: Rarely  . Drug use: No   . Sexual activity: Yes    Comment: husband vasectomy  Other Topics Concern  . Not on file  Social History Narrative   Pt gets regular exercise with cardio 2 days a week and 3 classes a week.   Diet consists of fruits and veggies, doesn't like meat much and likes soda and sweet tea.   Current Outpatient Medications on File Prior to Visit  Medication Sig Dispense Refill  . escitalopram (LEXAPRO) 10 MG tablet Take 1 tablet (10 mg total) by mouth daily. 30 tablet 11  . levothyroxine (SYNTHROID, LEVOTHROID) 25 MCG tablet Take 1 tablet (25 mcg total) by mouth daily before breakfast. 90 tablet 1  . ranitidine (ZANTAC) 150 MG tablet Take 1 tablet (150 mg total) by mouth 2 (two) times daily. 60 tablet 11  . Vitamin D, Ergocalciferol, (DRISDOL) 50000 units CAPS capsule Take 1 capsule (50,000 Units total) by mouth 2 (two) times a week. 30 capsule 1   No current facility-administered medications on file prior to visit.    Allergies  Allergen Reactions  . Corn-Containing Products   . Eggs Or Egg-Derived Products     Large amounts causes tightness in chest  . Peanut-Containing Drug Products    Family History  Problem Relation Age of Onset  . Hepatitis Father        Hep C  . Hypertension Father   . Hypothyroidism Sister   . Crohn's disease Brother   . Asthma Brother   . Diabetes Paternal Grandfather   . Leukemia Paternal Grandfather   . Seizures Maternal Aunt        Epilepsy  . Seizures Maternal Uncle        Epilepsy  . Liver cancer Maternal Uncle     PE: BP 100/68 (BP Location: Left Arm, Patient Position: Sitting, Cuff Size: Normal)   Pulse 72   Temp 98 F (36.7 C) (Oral)   Ht 5' 4.75" (1.645 m)   Wt 190 lb 2 oz (86.2 kg)   SpO2 98%   BMI 31.88 kg/m  Wt Readings from Last 3 Encounters:  03/10/17 190 lb 2 oz (86.2 kg)  01/06/17 183 lb 12 oz (83.3 kg)  12/26/16 182 lb 8 oz (82.8 kg)   Constitutional: overweight, in NAD Eyes: PERRLA, EOMI, no exophthalmos ENT: moist mucous  membranes, no thyromegaly, no cervical lymphadenopathy Cardiovascular: RRR, No MRG Respiratory: CTA B Gastrointestinal: abdomen soft, NT, ND, BS+ Musculoskeletal: no deformities, strength intact in all 4 Skin: moist, warm, no rashes Neurological: no tremor with outstretched hands, DTR normal in all 4  ASSESSMENT: 1. Hypothyroidism  2. Hashimoto's thyroiditis  3.  Reactive hypoglycemia  PLAN:  1. Patient with long-standing hypothyroidism, on low-dose levothyroxine therapy.  However, she takes the levothyroxine along with her Zantac, and I explained  that this will prevent absorption of the thyroid medication. - We discussed about correct intake of levothyroxine, fasting, with water, separated by at least 30 minutes from breakfast, and separated by more than 4 hours from calcium, iron, multivitamins, acid reflux medications.  I advised her to move Zantac 4 hours later. - will check thyroid tests in 5 weeks: TSH, free T4 - she will need a refill of her medication at that time - Otherwise, I will see her back in 6 months  2. Hashimoto's thyroiditis - we had a long discussion about her Hashimoto thyroiditis diagnosis. I explained that this is an autoimmune disorder, in which she develops antibodies against her own thyroid. The antibodies bind to the thyroid tissue and cause inflammation, and, eventually, destruction of the gland and hypothyroidism. We don't know how long this process can be, it can last from months to years.  - I also explained that thyroid enlargement especially at the beginning of her Hashimoto thyroiditis course is not uncommon, and it has a waxing and waning character.  She does feel occasional compression in her neck. - We discussed about treatment for Hashimoto thyroiditis, which is actually limited to thyroid hormones in case her TFTs are abnormal. Supplements like selenium has been tried with various results, some showing improvement in the TPO antibodies. However, there  are no randomized controlled trials of this are consistent results between trials. We also discussed about ways to improve her immune system (relaxation, diet, exercise, sleep to reduce the Ab titer and, subsequently, the thyroid inflammation. - We will start selenium 200 mcg daily.  I will recheck her thyroid antibodies at next visit  Orders Placed This Encounter  Procedures  . T4, free    Standing Status:   Future    Standing Expiration Date:   03/10/2018  . TSH    Standing Status:   Future    Standing Expiration Date:   03/10/2018   3.  Reactive hypoglycemia - Patient with recent significant weight gain reexperiences symptoms similar to hypoglycemia when she is hungry: Tremors, fatigue, palpitations. - We discussed about the concept of reactive hypoglycemia and I advised her that this is not necessarily related to diabetes or prediabetes (we reviewed her recent normal HbA1c, at 4.7%), but it may mean a state of insulin resistance in which the pancreatic secretion of insulin is mismatched with a meal.  I made some suggestions to change her meals but also suggested weight loss. Please add carbs with b'fast. No liquids within 30 min from a meal No concentrated carbs (as much as you can) or add them at the end of the meal. Try to move dinner earlier than 7 pm.   Philemon Kingdom, MD PhD [facility] Endocrinology

## 2017-03-18 DIAGNOSIS — J0100 Acute maxillary sinusitis, unspecified: Secondary | ICD-10-CM | POA: Diagnosis not present

## 2017-03-18 DIAGNOSIS — R05 Cough: Secondary | ICD-10-CM | POA: Diagnosis not present

## 2017-05-26 DIAGNOSIS — J988 Other specified respiratory disorders: Secondary | ICD-10-CM | POA: Diagnosis not present

## 2017-05-26 DIAGNOSIS — Z20818 Contact with and (suspected) exposure to other bacterial communicable diseases: Secondary | ICD-10-CM | POA: Diagnosis not present

## 2017-06-20 DIAGNOSIS — J0100 Acute maxillary sinusitis, unspecified: Secondary | ICD-10-CM | POA: Diagnosis not present

## 2017-06-20 DIAGNOSIS — H6980 Other specified disorders of Eustachian tube, unspecified ear: Secondary | ICD-10-CM | POA: Diagnosis not present

## 2017-06-20 DIAGNOSIS — R42 Dizziness and giddiness: Secondary | ICD-10-CM | POA: Diagnosis not present

## 2017-06-27 DIAGNOSIS — H93293 Other abnormal auditory perceptions, bilateral: Secondary | ICD-10-CM | POA: Diagnosis not present

## 2017-06-27 DIAGNOSIS — J0100 Acute maxillary sinusitis, unspecified: Secondary | ICD-10-CM | POA: Diagnosis not present

## 2017-06-27 DIAGNOSIS — H93292 Other abnormal auditory perceptions, left ear: Secondary | ICD-10-CM | POA: Diagnosis not present

## 2017-07-15 ENCOUNTER — Encounter: Payer: Self-pay | Admitting: Emergency Medicine

## 2017-07-15 ENCOUNTER — Other Ambulatory Visit: Payer: Self-pay

## 2017-07-15 ENCOUNTER — Encounter: Payer: Self-pay | Admitting: Obstetrics and Gynecology

## 2017-07-15 ENCOUNTER — Other Ambulatory Visit: Payer: Self-pay | Admitting: Obstetrics and Gynecology

## 2017-07-15 ENCOUNTER — Encounter: Payer: Self-pay | Admitting: Psychiatry

## 2017-07-15 ENCOUNTER — Emergency Department
Admission: EM | Admit: 2017-07-15 | Discharge: 2017-07-15 | Disposition: A | Discharge status: Home / Self Care | Payer: BLUE CROSS/BLUE SHIELD | Attending: Emergency Medicine | Admitting: Emergency Medicine

## 2017-07-15 ENCOUNTER — Ambulatory Visit (INDEPENDENT_AMBULATORY_CARE_PROVIDER_SITE_OTHER): Payer: BLUE CROSS/BLUE SHIELD | Admitting: Obstetrics and Gynecology

## 2017-07-15 VITALS — BP 123/84 | HR 77 | Ht 65.0 in | Wt 198.0 lb

## 2017-07-15 DIAGNOSIS — F3289 Other specified depressive episodes: Secondary | ICD-10-CM | POA: Diagnosis not present

## 2017-07-15 DIAGNOSIS — E669 Obesity, unspecified: Secondary | ICD-10-CM | POA: Diagnosis not present

## 2017-07-15 DIAGNOSIS — E069 Thyroiditis, unspecified: Secondary | ICD-10-CM | POA: Diagnosis not present

## 2017-07-15 DIAGNOSIS — F430 Acute stress reaction: Secondary | ICD-10-CM

## 2017-07-15 DIAGNOSIS — R45851 Suicidal ideations: Secondary | ICD-10-CM

## 2017-07-15 DIAGNOSIS — E039 Hypothyroidism, unspecified: Secondary | ICD-10-CM | POA: Insufficient documentation

## 2017-07-15 DIAGNOSIS — F329 Major depressive disorder, single episode, unspecified: Secondary | ICD-10-CM | POA: Diagnosis not present

## 2017-07-15 DIAGNOSIS — F332 Major depressive disorder, recurrent severe without psychotic features: Secondary | ICD-10-CM

## 2017-07-15 DIAGNOSIS — F410 Panic disorder [episodic paroxysmal anxiety] without agoraphobia: Secondary | ICD-10-CM | POA: Diagnosis not present

## 2017-07-15 DIAGNOSIS — Z79899 Other long term (current) drug therapy: Secondary | ICD-10-CM | POA: Diagnosis not present

## 2017-07-15 DIAGNOSIS — E063 Autoimmune thyroiditis: Secondary | ICD-10-CM | POA: Diagnosis present

## 2017-07-15 HISTORY — DX: Autoimmune thyroiditis: E06.3

## 2017-07-15 LAB — COMPREHENSIVE METABOLIC PANEL
ALT: 21 U/L (ref 14–54)
ANION GAP: 8 (ref 5–15)
AST: 27 U/L (ref 15–41)
Albumin: 4.2 g/dL (ref 3.5–5.0)
Alkaline Phosphatase: 59 U/L (ref 38–126)
BILIRUBIN TOTAL: 0.9 mg/dL (ref 0.3–1.2)
BUN: 13 mg/dL (ref 6–20)
CHLORIDE: 107 mmol/L (ref 101–111)
CO2: 25 mmol/L (ref 22–32)
Calcium: 9.2 mg/dL (ref 8.9–10.3)
Creatinine, Ser: 0.77 mg/dL (ref 0.44–1.00)
Glucose, Bld: 94 mg/dL (ref 65–99)
POTASSIUM: 4.2 mmol/L (ref 3.5–5.1)
Sodium: 140 mmol/L (ref 135–145)
TOTAL PROTEIN: 7.4 g/dL (ref 6.5–8.1)

## 2017-07-15 LAB — CBC
HCT: 40.2 % (ref 35.0–47.0)
Hemoglobin: 13.7 g/dL (ref 12.0–16.0)
MCH: 31.5 pg (ref 26.0–34.0)
MCHC: 34.1 g/dL (ref 32.0–36.0)
MCV: 92.4 fL (ref 80.0–100.0)
PLATELETS: 190 10*3/uL (ref 150–440)
RBC: 4.35 MIL/uL (ref 3.80–5.20)
RDW: 13.4 % (ref 11.5–14.5)
WBC: 4.5 10*3/uL (ref 3.6–11.0)

## 2017-07-15 LAB — URINE DRUG SCREEN, QUALITATIVE (ARMC ONLY)
AMPHETAMINES, UR SCREEN: NOT DETECTED
BENZODIAZEPINE, UR SCRN: NOT DETECTED
Barbiturates, Ur Screen: NOT DETECTED
CANNABINOID 50 NG, UR ~~LOC~~: NOT DETECTED
Cocaine Metabolite,Ur ~~LOC~~: NOT DETECTED
MDMA (ECSTASY) UR SCREEN: NOT DETECTED
Methadone Scn, Ur: NOT DETECTED
Opiate, Ur Screen: NOT DETECTED
PHENCYCLIDINE (PCP) UR S: NOT DETECTED
TRICYCLIC, UR SCREEN: NOT DETECTED

## 2017-07-15 LAB — TSH: TSH: 3.198 u[IU]/mL (ref 0.350–4.500)

## 2017-07-15 LAB — PREGNANCY, URINE: Preg Test, Ur: NEGATIVE

## 2017-07-15 LAB — SALICYLATE LEVEL

## 2017-07-15 LAB — ACETAMINOPHEN LEVEL

## 2017-07-15 LAB — ETHANOL

## 2017-07-15 MED ORDER — BUPROPION HCL ER (XL) 150 MG PO TB24: 300.0000 mg | ORAL_TABLET | Freq: Every day | ORAL | 1 refills | Status: DC

## 2017-07-15 NOTE — ED Notes (Signed)
Pt dressed out into appropriate behavioral health clothing. Pt belongings consist of a black hat,a pink watch, a clear hair bow, black boots, black pants, yellow earrings, a black/white shirt, pt has three $20 bills, four $1 bills in purse, a white cell phone, a gray purse, black socks, a tan girdle, purple panties and a black bra. Pt calm and cooperative while dressing out.

## 2017-07-15 NOTE — ED Notes (Signed)
Pt denies SI/HI/AVH. Pt given discharge instructions including prescriptions and f/u appointments. Pt states understanding. Pt states receipt of all belongings.   

## 2017-07-15 NOTE — Progress Notes (Signed)
Subjective:     Patient ID: Sara Howell, female   DOB: [birth year], 42 y.o.   MRN: [id number]  HPI Reports worsening depression since October/November, due to increased work stressors. Xanax is still helping and she is taking daily or she becomes very agitated.Still taking Lexapro.  Reports last 6 weeks it has worsened and she has been planning suicide. States that she plans to take all her medications when she is out of town for work so her children wouldn't find her body. Hasn't tried it yet, but states it is harder to fight the feelings. States spouse is just tired of hearing it. No support at work and has 'went off' on boss and co-workers multiple times. Also 'losing it' with her children, and feels like they would be better off without her here acting this way. Denies any manic moments, just severe depression. Feeling hopeless.  Also diagnosed with Hoshimoto thyroiditis in August. Last labs in chart with elevated TSH and thyroid antibodies. States she has not had it rechecked in months. Is not sleeping well. Having panic attacks a work, no energy, but still doing hot yoga, but has trouble findings the time or energy. Works from home when she is not out of town. Her company base is in [place].   Depression screen [facility] 2/9 07/15/2017 03/06/2016  Decreased Interest 3 1  Down, Depressed, Hopeless 3 2  PHQ - 2 Score 6 3  Altered sleeping 3 0  Tired, decreased energy 3 1  Change in appetite 3 1  Feeling bad or failure about yourself  2 1  Trouble concentrating 2 0  Moving slowly or fidgety/restless 0 0  Suicidal thoughts 2 1  PHQ-9 Score 21 7  Difficult doing work/chores Very difficult -   Review of Systems Negative except stated above in HPI    Objective:   Physical Exam  A&O x4 Tearful and fidgity during entire visit. Thyroid normal on exam.  Blood pressure 123/84, pulse 77, height 5\' 5"  (1.651 m), weight 198 lb (89.8 kg), last menstrual period 06/28/2017.    Assessment:      Depression, severe episode with suicidal ideation Panic attacks Thyroiditis Obesity Sleep dysfunction      Plan:     Labs obtained and will follow up accordingly Discussed concern that thyroiditis is contributing to depression due to timing of onset and known potential to affect mood disorders. Recommended inpatient admission and treatment, to which patient consents. Took to ED, and will let them assume care from here.   >50% of 45 minute visit spent in counseling.

## 2017-07-15 NOTE — ED Provider Notes (Addendum)
[facility] Emergency Department Provider Note  ____________________________________________   I have reviewed the triage vital signs and the nursing notes. Where available I have reviewed prior notes and, if possible and indicated, outside hospital notes.    HISTORY  Chief Complaint Suicidal    HPI Sara Howell is a 42 y.o. female with a history of depression in the past, states she had postpartum depression with her child 16 years ago.  Patient states she has had suicidal thoughts and specific thoughts about how she would do it.  She is not sure if she would.  She thinks he probably would not because of level once but she has been having more more thoughts of this nature recently.  She states she would take pills when she was away from home on business.  He denies actually having taken anything, she states that she did much does not think she would because of her family.  She states work stress is the reason for her symptoms and she has been feeling this for some time and she denies being abused at home.  She denies any focal numbness or weakness.  She is taking her medications.  Nothing makes her symptoms better nothing makes them worse.   Past Medical History:  Diagnosis Date  . Anal fissure   . Anemia   . Anxiety   . Depression   . Hypothyroidism 10/12   post partum only, no further need for meds  . PONV (postoperative nausea and vomiting)   . PVC (premature ventricular contraction)    no meds no cardiologist routinely    Patient Active Problem List   Diagnosis Date Noted  . Hashimoto's thyroiditis 03/10/2017  . Mood disorder ([place]) 08/27/2016  . Vitamin D deficiency 09/28/2015  . Suprapubic abdominal pain 09/09/2014  . Routine general medical examination at a health care facility 01/30/2011  . Subclinical hypothyroidism 05/24/2010  . Overweight (BMI 25.0-29.9) 05/24/2010  . HEMORRHOIDS, INTERNAL 03/08/2008  . UMBILICAL HERNIA 16/01/9603     Past Surgical History:  Procedure Laterality Date  . CERVICAL DISCECTOMY  2007   No specified location  . GANGLION CYST EXCISION Left 2011  . LAPAROSCOPY  09/12/2011   Procedure: LAPAROSCOPY OPERATIVE;  Surgeon: Marylynn Pearson, MD;  Location: [facility];  Service: Gynecology;  Laterality: Right;  right oophorectomy  . MUSCLE BIOPSY  as a teenager   non specific; biopsy  . NSVD     x 2  . WISDOM TOOTH EXTRACTION      Prior to Admission medications   Medication Sig Start Date End Date Taking? Authorizing Provider  escitalopram (LEXAPRO) 10 MG tablet Take 1 tablet (10 mg total) by mouth daily. 12/26/16   Shambley, Melody N, CNM  levothyroxine (SYNTHROID, LEVOTHROID) 25 MCG tablet Take 1 tablet (25 mcg total) by mouth daily before breakfast. 01/06/17   Venia Carbon, MD  ranitidine (ZANTAC) 150 MG tablet Take 1 tablet (150 mg total) by mouth 2 (two) times daily. 12/26/16   Shambley, Melody N, CNM  Vitamin D, Ergocalciferol, (DRISDOL) 50000 units CAPS capsule Take 1 capsule (50,000 Units total) by mouth 2 (two) times a week. 01/02/17   Shambley, Melody N, CNM    Allergies Corn-containing products; Eggs or egg-derived products; and Peanut-containing drug products  Family History  Problem Relation Age of Onset  . Hepatitis Father        Hep C  . Hypertension Father   . Hypothyroidism Sister   . Crohn's disease Brother   .  Asthma Brother   . Diabetes Paternal Grandfather   . Leukemia Paternal Grandfather   . Seizures Maternal Aunt        Epilepsy  . Seizures Maternal Uncle        Epilepsy  . Liver cancer Maternal Uncle     Social History Social History   Tobacco Use  . Smoking status: Never Smoker  . Smokeless tobacco: Never Used  Substance Use Topics  . Alcohol use: Yes    Comment: Rarely  . Drug use: No    Review of Systems Constitutional: No fever/chills Eyes: No visual changes. ENT: No sore throat. No stiff neck no neck pain Cardiovascular: Denies chest  pain. Respiratory: Denies shortness of breath. Gastrointestinal:   no vomiting.  No diarrhea.  No constipation. Genitourinary: Negative for dysuria. Musculoskeletal: Negative lower extremity swelling Skin: Negative for rash. Neurological: Negative for severe headaches, focal weakness or numbness.   ____________________________________________   PHYSICAL EXAM:  VITAL SIGNS: ED Triage Vitals [07/15/17 1018]  Enc Vitals Group     BP (!) 148/93     Pulse Rate 70     Resp 20     Temp 97.7 F (36.5 C)     Temp Source Oral     SpO2 99 %     Weight 198 lb (89.8 kg)     Height 5\' 5"  (1.651 m)     Head Circumference      Peak Flow      Pain Score      Pain Loc      Pain Edu?      Excl. in [place]?     Constitutional: Alert and oriented. Well appearing and in no acute distress. Eyes: Conjunctivae are normal Head: Atraumatic HEENT: No congestion/rhinnorhea. Mucous membranes are moist.  Oropharynx non-erythematous Neck:   Nontender with no meningismus, no masses, no stridor Cardiovascular: Normal rate, regular rhythm. Grossly normal heart sounds.  Good peripheral circulation. Respiratory: Normal respiratory effort.  No retractions. Lungs CTAB. Abdominal: Soft and nontender. No distention. No guarding no rebound Back:  There is no focal tenderness or step off.  there is no midline tenderness there are no lesions noted. there is no CVA tenderness Musculoskeletal: No lower extremity tenderness, no upper extremity tenderness. No joint effusions, no DVT signs strong distal pulses no edema Neurologic:  Normal speech and language. No gross focal neurologic deficits are appreciated.  Skin:  Skin is warm, dry and intact. No rash noted. Psychiatric: Mood and affect are flat. Speech and behavior are normal.  ____________________________________________   LABS (all labs ordered are listed, but only abnormal results are displayed)  Labs Reviewed  ACETAMINOPHEN LEVEL - Abnormal; Notable for the  following components:      Result Value   Acetaminophen (Tylenol), Serum <10 (*)    All other components within normal limits  COMPREHENSIVE METABOLIC PANEL  ETHANOL  SALICYLATE LEVEL  CBC  URINE DRUG SCREEN, QUALITATIVE ([facility] ONLY)  PREGNANCY, URINE  TSH    Pertinent labs  results that were available during my care of the patient were reviewed by me and considered in my medical decision making (see chart for details). ____________________________________________  EKG  I personally interpreted any EKGs ordered by me or triage  ____________________________________________  RADIOLOGY  Pertinent labs & imaging results that were available during my care of the patient were reviewed by me and considered in my medical decision making (see chart for details). If possible, patient and/or family made aware of any abnormal findings.  No results found. ____________________________________________    PROCEDURES  Procedure(s) performed: None  Procedures  Critical Care performed: None  ____________________________________________   INITIAL IMPRESSION / ASSESSMENT AND PLAN / ED COURSE  Pertinent labs & imaging results that were available during my care of the patient were reviewed by me and considered in my medical decision making (see chart for details).  Patient here with suicidal thoughts, we will consult psychiatry.  No evidence of toxidrome or attempt at this time.  ----------------------------------------- 1:26 PM on 07/15/2017 -----------------------------------------  Patient was seen and evaluated by psychiatry.  She does not have any plan to actually hurt herself she contracts for safety, that she would not hurt herself or anyone else.  Dr. Weber Cooks feels that she is not a danger to herself or the community and he feels comfortable with discharge.  He will change her medications.  Patient with good eye contact contracts for safety here.     ____________________________________________   FINAL CLINICAL IMPRESSION(S) / ED DIAGNOSES  Final diagnoses:  None      This chart was dictated using voice recognition software.  Despite best efforts to proofread,  errors can occur which can change meaning.      Schuyler Amor, MD 07/15/17 1241    Schuyler Amor, MD 07/15/17 1331

## 2017-07-15 NOTE — ED Triage Notes (Signed)
Pt to ED from her PCP for worsening depression. Pt states that she has had suicidal thoughts and plan, pt would not say what her plan was just that she would not do it while here where her family would find her. Pt states that she has thought about where and how she would commit suicide. Pt states that she is under a lot of stress at work. Pt calm and cooperative at this time.

## 2017-07-15 NOTE — Discharge Instructions (Signed)
Any thoughts of hurting herself or anyone else, as we discussed, return to the emergency department.

## 2017-07-15 NOTE — ED Triage Notes (Signed)
Brought from [facility] for suicidal plan.

## 2017-07-15 NOTE — Consult Note (Signed)
[facility] Consult   Reason for Consult: Consult for 42 year old woman who presented voluntarily to the emergency room for assessment of depression Referring Physician: McShane Patient Identification: Sara Howell MRN:  [id number] Principal Diagnosis: Severe recurrent major depression without psychotic features [facility]) Diagnosis:   Patient Active Problem List   Diagnosis Date Noted  . Severe recurrent major depression without psychotic features ([place]) [F33.2] 07/15/2017  . Hashimoto's thyroiditis [E06.3] 03/10/2017  . Mood disorder ([place]) [F39] 08/27/2016  . Vitamin D deficiency [E55.9] 09/28/2015  . Suprapubic abdominal pain [R10.2] 09/09/2014  . Routine general medical examination at a health care facility [Z00.00] 01/30/2011  . Subclinical hypothyroidism [E03.9] 05/24/2010  . Overweight (BMI 25.0-29.9) [E66.3] 05/24/2010  . HEMORRHOIDS, INTERNAL [K64.8] 03/08/2008  . UMBILICAL HERNIA [K09.3] 03/08/2008    Total Time spent with patient: 1 hour  Subjective:   Sara Howell is a 42 y.o. female patient admitted with "my therapist and I decided I should come here"  HPI: Patient seen chart reviewed.  42 year old woman presented voluntarily to the emergency room.  She says that she spoke with the person she sees at her OB/GYN office about how much worse her depression had become and they referred her to the emergency room.  Patient describes multiple symptoms of depression going back several months.  Have been getting worse for 3 or 4 months.  Patient feels anxious nervous and in a bad mood most of the time.  Frequent irritability.  She is excessively sleepy and tired much of the time.  Feels a lack of motivation.  She has been eating more than usual.  Patient admits she has had suicidal thoughts but says she has never formed any actual plan of doing anything or thought through any intention of acting on it.  She is not having any hallucinations or psychotic symptoms.  She is  not drinking or abusing any drugs.  She is currently on Lexapro and Xanax prescribed by her OB/GYN clinic.  Major life stresses are mostly focused on her job.  Medical history: Patient has Hashimoto's thyroiditis is.  Takes thyroid supplement.  History of vitamin D deficiency.  Some complications from previous pregnancies.  Substance abuse history: Denies alcohol or drug abuse current or past  Social history: Patient is married has 3 children.  Her 3 children are the most important thing to her in the thing that she focuses on when having suicidal thoughts.  She is employed by a company that requires her to travel a great deal and she finds the job stressful and overwhelming at times.  She does not go into detail but says her relationship with her husband is not as good as she would like.  Past Psychiatric History: Patient reports being diagnosed with postpartum depression around the time of the birth of her second child which was about 14 years ago.  No history of suicide attempts.  No history of hospitalization.  She was treated with Lexapro at that time with good effect.  Eventually came off it and went for a long period of time without medication.  Since going back to her primary care doctor about her depression was tried briefly on Prozac which she found over sedating and now has been on 20 mg of Lexapro and as needed Xanax for several months without significant benefit.  No history of mania or psychosis.  Risk to Self: Is patient at risk for suicide?: Yes Risk to Others:   Prior Inpatient Therapy:   Prior Outpatient Therapy:  Past Medical History:  Past Medical History:  Diagnosis Date  . Anal fissure   . Anemia   . Anxiety   . Depression   . Hashimoto's thyroiditis 03/10/2017  . Hypothyroidism 10/12   post partum only, no further need for meds  . PONV (postoperative nausea and vomiting)   . PVC (premature ventricular contraction)    no meds no cardiologist routinely    Past  Surgical History:  Procedure Laterality Date  . CERVICAL DISCECTOMY  2007   No specified location  . GANGLION CYST EXCISION Left 2011  . LAPAROSCOPY  09/12/2011   Procedure: LAPAROSCOPY OPERATIVE;  Surgeon: Marylynn Pearson, MD;  Location: [facility];  Service: Gynecology;  Laterality: Right;  right oophorectomy  . MUSCLE BIOPSY  as a teenager   non specific; biopsy  . NSVD     x 2  . WISDOM TOOTH EXTRACTION     Family History:  Family History  Problem Relation Age of Onset  . Hepatitis Father        Hep C  . Hypertension Father   . Hypothyroidism Sister   . Crohn's disease Brother   . Asthma Brother   . Diabetes Paternal Grandfather   . Leukemia Paternal Grandfather   . Seizures Maternal Aunt        Epilepsy  . Seizures Maternal Uncle        Epilepsy  . Liver cancer Maternal Uncle    Family Psychiatric  History: Reports a grandmother had depression and so did at least one other member of her mother's side of the family Social History:  Social History   Substance and Sexual Activity  Alcohol Use Yes   Comment: Rarely     Social History   Substance and Sexual Activity  Drug Use No    Social History   Socioeconomic History  . Marital status: Married    Spouse name: None  . Number of children: 3  . Years of education: None  . Highest education level: None  Social Needs  . Financial resource strain: None  . Food insecurity - worry: None  . Food insecurity - inability: None  . Transportation needs - medical: None  . Transportation needs - non-medical: None  Occupational History  . Occupation: Tree surgeon    Comment: Inside sales--- [employer]  Tobacco Use  . Smoking status: Never Smoker  . Smokeless tobacco: Never Used  Substance and Sexual Activity  . Alcohol use: Yes    Comment: Rarely  . Drug use: No  . Sexual activity: Yes    Comment: husband vasectomy  Other Topics Concern  . None  Social History Narrative   Pt gets regular exercise with  cardio 2 days a week and 3 classes a week.   Diet consists of fruits and veggies, doesn't like meat much and likes soda and sweet tea.   Additional Social History:    Allergies:   Allergies  Allergen Reactions  . Corn-Containing Products   . Eggs Or Egg-Derived Products     Large amounts causes tightness in chest  . Peanut-Containing Drug Products     Labs:  Results for orders placed or performed during the hospital encounter of 07/15/17 (from the past 48 hour(s))  Pregnancy, urine     Status: None   Collection Time: 07/15/17 10:19 AM  Result Value Ref Range   Preg Test, Ur NEGATIVE NEGATIVE    Comment: Performed at [facility], [address], [place], [place] [address]  Comprehensive metabolic panel     Status: None   Collection Time: 07/15/17 10:28 AM  Result Value Ref Range   Sodium 140 135 - 145 mmol/L   Potassium 4.2 3.5 - 5.1 mmol/L   Chloride 107 101 - 111 mmol/L   CO2 25 22 - 32 mmol/L   Glucose, Bld 94 65 - 99 mg/dL   BUN 13 6 - 20 mg/dL   Creatinine, Ser 0.77 0.44 - 1.00 mg/dL   Calcium 9.2 8.9 - 10.3 mg/dL   Total Protein 7.4 6.5 - 8.1 g/dL   Albumin 4.2 3.5 - 5.0 g/dL   AST 27 15 - 41 U/L   ALT 21 14 - 54 U/L   Alkaline Phosphatase 59 38 - 126 U/L   Total Bilirubin 0.9 0.3 - 1.2 mg/dL   GFR calc non Af Amer >60 >60 mL/min   GFR calc Af Amer >60 >60 mL/min    Comment: (NOTE) The eGFR has been calculated using the CKD EPI equation. This calculation has not been validated in all clinical situations. eGFR's persistently <60 mL/min signify possible Chronic Kidney Disease.    Anion gap 8 5 - 15    Comment: Performed at [facility], [address] [place], [place] [address]  Ethanol     Status: None   Collection Time: 07/15/17 10:28 AM  Result Value Ref Range   Alcohol, Ethyl (B) <10 <10 mg/dL    Comment:        LOWEST DETECTABLE LIMIT FOR SERUM ALCOHOL IS 10 mg/dL FOR MEDICAL PURPOSES ONLY Performed at [facility], [address] [place], [place] [address]   Salicylate level     Status: None   Collection Time: 07/15/17 10:28 AM  Result Value Ref Range   Salicylate Lvl <5.9 2.8 - 30.0 mg/dL    Comment: Performed at [facility], [address] [place], [place] [address]  Acetaminophen level     Status: Abnormal   Collection Time: 07/15/17 10:28 AM  Result Value Ref Range   Acetaminophen (Tylenol), Serum <10 (L) 10 - 30 ug/mL    Comment:        THERAPEUTIC CONCENTRATIONS VARY SIGNIFICANTLY. A RANGE OF 10-30 ug/mL MAY BE AN EFFECTIVE CONCENTRATION FOR MANY PATIENTS. HOWEVER, SOME ARE BEST TREATED AT CONCENTRATIONS OUTSIDE THIS RANGE. ACETAMINOPHEN CONCENTRATIONS >150 ug/mL AT 4 HOURS AFTER INGESTION AND >50 ug/mL AT 12 HOURS AFTER INGESTION ARE OFTEN ASSOCIATED WITH TOXIC REACTIONS. Performed at [facility], [address] [place], [place] [address]   cbc     Status: None   Collection Time: 07/15/17 10:28 AM  Result Value Ref Range   WBC 4.5 3.6 - 11.0 K/uL   RBC 4.35 3.80 - 5.20 MIL/uL   Hemoglobin 13.7 12.0 - 16.0 g/dL   HCT 40.2 35.0 - 47.0 %   MCV 92.4 80.0 - 100.0 fL   MCH 31.5 26.0 - 34.0 pg   MCHC 34.1 32.0 - 36.0 g/dL   RDW 13.4 11.5 - 14.5 %   Platelets 190 150 - 440 K/uL    Comment: Performed at [facility], [address], [place], [place] [address]  Urine Drug Screen, Qualitative     Status: None   Collection Time: 07/15/17 10:28 AM  Result Value Ref Range   Tricyclic, Ur Screen NONE DETECTED NONE DETECTED   Amphetamines, Ur Screen NONE DETECTED NONE DETECTED   MDMA (Ecstasy)Ur Screen NONE DETECTED NONE DETECTED   Cocaine Metabolite,Ur [place] NONE DETECTED NONE  DETECTED   Opiate, Ur Screen NONE DETECTED NONE DETECTED   Phencyclidine (PCP) Ur S NONE DETECTED NONE DETECTED   Cannabinoid 50 Ng, Ur [place] NONE DETECTED NONE DETECTED   Barbiturates, Ur Screen NONE DETECTED NONE DETECTED   Benzodiazepine, Ur Scrn NONE DETECTED NONE DETECTED    Methadone Scn, Ur NONE DETECTED NONE DETECTED    Comment: (NOTE) Tricyclics + metabolites, urine    Cutoff 1000 ng/mL Amphetamines + metabolites, urine  Cutoff 1000 ng/mL MDMA (Ecstasy), urine              Cutoff 500 ng/mL Cocaine Metabolite, urine          Cutoff 300 ng/mL Opiate + metabolites, urine        Cutoff 300 ng/mL Phencyclidine (PCP), urine         Cutoff 25 ng/mL Cannabinoid, urine                 Cutoff 50 ng/mL Barbiturates + metabolites, urine  Cutoff 200 ng/mL Benzodiazepine, urine              Cutoff 200 ng/mL Methadone, urine                   Cutoff 300 ng/mL The urine drug screen provides only a preliminary, unconfirmed analytical test result and should not be used for non-medical purposes. Clinical consideration and professional judgment should be applied to any positive drug screen result due to possible interfering substances. A more specific alternate chemical method must be used in order to obtain a confirmed analytical result. Gas chromatography / mass spectrometry (GC/MS) is the preferred confirmat ory method. Performed at [facility], [address] [place], [place] [address]   TSH     Status: None   Collection Time: 07/15/17 10:28 AM  Result Value Ref Range   TSH 3.198 0.350 - 4.500 uIU/mL    Comment: Performed by a 3rd Generation assay with a functional sensitivity of <=0.01 uIU/mL. Performed at [facility], [address] [place], [place] [address]     No current facility-administered medications for this encounter.    Current Outpatient Medications  Medication Sig Dispense Refill  . buPROPion (WELLBUTRIN XL) 150 MG 24 hr tablet Take 2 tablets (300 mg total) by mouth daily. 60 tablet 1  . escitalopram (LEXAPRO) 10 MG tablet Take 1 tablet (10 mg total) by mouth daily. 30 tablet 11  . levothyroxine (SYNTHROID, LEVOTHROID) 25 MCG tablet Take 1 tablet (25 mcg total) by mouth daily before breakfast. 90 tablet 1  . ranitidine  (ZANTAC) 150 MG tablet Take 1 tablet (150 mg total) by mouth 2 (two) times daily. 60 tablet 11  . Vitamin D, Ergocalciferol, (DRISDOL) 50000 units CAPS capsule Take 1 capsule (50,000 Units total) by mouth 2 (two) times a week. 30 capsule 1    Musculoskeletal: Strength & Muscle Tone: within normal limits Gait & Station: normal Patient leans: N/A  Psychiatric Specialty Exam: Physical Exam  Nursing note and vitals reviewed. Constitutional: She appears well-developed and well-nourished.  HENT:  Head: Normocephalic and atraumatic.  Eyes: Conjunctivae are normal. Pupils are equal, round, and reactive to light.  Neck: Normal range of motion.  Cardiovascular: Regular rhythm and normal heart sounds.  Respiratory: Effort normal. No respiratory distress.  GI: Soft.  Musculoskeletal: Normal range of motion.  Neurological: She is alert.  Skin: Skin is warm and dry.  Psychiatric: Her speech is normal and behavior is normal. Judgment normal. Her mood appears anxious.  Cognition and memory are normal. She exhibits a depressed mood. She expresses suicidal ideation. She expresses no suicidal plans.    Review of Systems  Constitutional: Negative.   HENT: Negative.   Eyes: Negative.   Respiratory: Negative.   Cardiovascular: Negative.   Gastrointestinal: Negative.   Musculoskeletal: Negative.   Skin: Negative.   Neurological: Negative.   Psychiatric/Behavioral: Positive for depression and suicidal ideas. Negative for hallucinations, memory loss and substance abuse. The patient is nervous/anxious. The patient does not have insomnia.     Blood pressure (!) 148/93, pulse 70, temperature 97.7 F (36.5 C), temperature source Oral, resp. rate 20, height 5' 5"  (1.651 m), weight 89.8 kg (198 lb), last menstrual period 06/28/2017, SpO2 99 %.Body mass index is 32.95 kg/m.  General Appearance: Casual  Eye Contact:  Good  Speech:  Clear and Coherent  Volume:  Normal  Mood:  Dysphoric  Affect:  Congruent   Thought Process:  Goal Directed  Orientation:  Full (Time, Place, and Person)  Thought Content:  Logical  Suicidal Thoughts:  Yes.  without intent/plan  Homicidal Thoughts:  No  Memory:  Immediate;   Fair Recent;   Fair Remote;   Fair  Judgement:  Fair  Insight:  Fair  Psychomotor Activity:  Normal  Concentration:  Concentration: Fair  Recall:  [employer] of Knowledge:  Fair  Language:  Fair  Akathisia:  No  Handed:  Right  AIMS (if indicated):     Assets:  Desire for Improvement Housing Physical Health Resilience Social Support  ADL's:  Intact  Cognition:  WNL  Sleep:        Treatment Plan Summary: Daily contact with patient to assess and evaluate symptoms and progress in treatment, Medication management and Plan Patient was referred to the emergency room because of concern when she revealed some suicidal thoughts.  She spoke about them in some detail with myself and with the emergency room physician.  Patient was clear that she had no intention or plan of acting on these.  She is focused instead on wanting to get appropriate treatment.  Patient is not abusing substances and does not have a history of impulsive or suicidal behavior in the past.  I do not think she meets commitment criteria.  Patient was not interested in considering admission to the inpatient unit.  I think she needs to be seeing a psychiatric or mental health provider.  TTS can provide her with information about local resources.  I also suggested we make some changes to her medicine.  Given that Prozac was over sedating I suggested a trial of Wellbutrin which could be cross titrated with the Lexapro.  Side effects discussed.  Patient agrees to plan.  Start Wellbutrin extended release 150 mg/day for 3 days then increase the dose to 300 mg/day.  Continue current medicine until following up with her provider.  Patient agrees.  Case reviewed with ER doctor.  Patient can be released from the emergency room at his  discretion.  Disposition: No evidence of imminent risk to self or others at present.   Patient does not meet criteria for psychiatric inpatient admission. Supportive therapy provided about ongoing stressors.  Alethia Berthold, MD 07/15/2017 3:47 PM

## 2017-07-16 ENCOUNTER — Telehealth: Payer: Self-pay

## 2017-07-16 DIAGNOSIS — T700XXD Otitic barotrauma, subsequent encounter: Secondary | ICD-10-CM | POA: Diagnosis not present

## 2017-07-16 DIAGNOSIS — H93292 Other abnormal auditory perceptions, left ear: Secondary | ICD-10-CM | POA: Diagnosis not present

## 2017-07-16 LAB — COMPREHENSIVE METABOLIC PANEL
ALBUMIN: 4.3 g/dL (ref 3.5–5.5)
ALT: 18 IU/L (ref 0–32)
AST: 24 IU/L (ref 0–40)
Albumin/Globulin Ratio: 1.7 (ref 1.2–2.2)
Alkaline Phosphatase: 69 IU/L (ref 39–117)
BILIRUBIN TOTAL: 0.6 mg/dL (ref 0.0–1.2)
BUN / CREAT RATIO: 16 (ref 9–23)
BUN: 12 mg/dL (ref 6–24)
CHLORIDE: 106 mmol/L (ref 96–106)
CO2: 23 mmol/L (ref 20–29)
Calcium: 9.2 mg/dL (ref 8.7–10.2)
Creatinine, Ser: 0.75 mg/dL (ref 0.57–1.00)
GFR, EST AFRICAN AMERICAN: 114 mL/min/{1.73_m2} (ref >59)
GFR, EST NON AFRICAN AMERICAN: 99 mL/min/{1.73_m2} (ref >59)
GLUCOSE: 92 mg/dL (ref 65–99)
Globulin, Total: 2.5 g/dL (ref 1.5–4.5)
Potassium: 4.3 mmol/L (ref 3.5–5.2)
Sodium: 143 mmol/L (ref 134–144)
TOTAL PROTEIN: 6.8 g/dL (ref 6.0–8.5)

## 2017-07-16 LAB — THYROID PANEL WITH TSH
FREE THYROXINE INDEX: 1.3 (ref 1.2–4.9)
T3 Uptake Ratio: 22 % — ABNORMAL LOW (ref 24–39)
T4, Total: 6 ug/dL (ref 4.5–12.0)
TSH: 3.58 u[IU]/mL (ref 0.450–4.500)

## 2017-07-16 LAB — B12 AND FOLATE PANEL
FOLATE: 18.1 ng/mL (ref >3.0)
VITAMIN B 12: 290 pg/mL (ref 232–1245)

## 2017-07-16 LAB — THYROID ANTIBODIES
Thyroglobulin Antibody: 79.6 IU/mL — ABNORMAL HIGH (ref 0.0–0.9)
Thyroperoxidase Ab SerPl-aCnc: 168 IU/mL — ABNORMAL HIGH (ref 0–34)

## 2017-07-16 LAB — VITAMIN D 25 HYDROXY (VIT D DEFICIENCY, FRACTURES): Vit D, 25-Hydroxy: 18.3 ng/mL — ABNORMAL LOW (ref 30.0–100.0)

## 2017-07-16 NOTE — Telephone Encounter (Signed)
Spoke to pt. She has not made her appt with the psychiatrist. Plans to do that tomorrow. Says she is a little better today.

## 2017-07-16 NOTE — Telephone Encounter (Signed)
Left message to call the office after recent ER Visit:  Per Dr Silvio Pate: Depression worse and saw psychiatrist who made some medication changes.  Make sure she has follow up appt with psychiatrist.   PEC, if pt calls back, please ask how she is doing and if she has an appt with her psychiatrist. Thanks

## 2017-07-17 ENCOUNTER — Encounter: Payer: Self-pay | Admitting: Obstetrics and Gynecology

## 2017-07-17 ENCOUNTER — Ambulatory Visit (INDEPENDENT_AMBULATORY_CARE_PROVIDER_SITE_OTHER): Payer: BLUE CROSS/BLUE SHIELD | Admitting: Obstetrics and Gynecology

## 2017-07-17 VITALS — BP 145/85 | HR 80 | Wt 195.8 lb

## 2017-07-17 DIAGNOSIS — E559 Vitamin D deficiency, unspecified: Secondary | ICD-10-CM | POA: Diagnosis not present

## 2017-07-17 DIAGNOSIS — F332 Major depressive disorder, recurrent severe without psychotic features: Secondary | ICD-10-CM | POA: Diagnosis not present

## 2017-07-17 MED ORDER — VITAMIN D (ERGOCALCIFEROL) 1.25 MG (50000 UNIT) PO CAPS
50000.0000 [IU] | ORAL_CAPSULE | ORAL | 1 refills | Status: DC
Start: 2017-07-17 — End: 2017-12-31

## 2017-07-17 NOTE — Progress Notes (Signed)
Subjective:     Patient ID: Sara Howell, female   DOB: 1975/12/21, 42 y.o.   MRN: [id number]  HPI Please see previous visit and ED notes. Feeling a tiny bit better. States they added wellbutrin to medication and she is supposed to call and make f/u appointment with psychiatrist. She states she just wants to sleep all the time. States spouse is supportive and she is working on Educational psychologist.  Review of Systems Negative except stated above in HPI    Objective:   Physical Exam A&Ox4 Well groomed female in no distress Mood and affect appropriate Blood pressure (!) 145/85, pulse 80, weight 195 lb 12.8 oz (88.8 kg), last menstrual period 06/28/2017.    Assessment:     Depression, ED follow up Thyroiditis     Plan:     referal to [facility] for psychotherapy. To follow up with endocrinologist immediately.  RTC prn.  Shaquille Murdy [place], CNM

## 2017-07-24 ENCOUNTER — Encounter: Payer: Self-pay | Admitting: Psychiatry

## 2017-07-24 ENCOUNTER — Ambulatory Visit: Payer: BLUE CROSS/BLUE SHIELD | Admitting: Psychiatry

## 2017-07-24 ENCOUNTER — Other Ambulatory Visit: Payer: Self-pay

## 2017-07-24 VITALS — BP 141/89 | HR 81 | Temp 98.0°F | Wt 197.4 lb

## 2017-07-24 DIAGNOSIS — F320 Major depressive disorder, single episode, mild: Secondary | ICD-10-CM | POA: Diagnosis not present

## 2017-07-24 DIAGNOSIS — F411 Generalized anxiety disorder: Secondary | ICD-10-CM | POA: Diagnosis not present

## 2017-07-24 DIAGNOSIS — F419 Anxiety disorder, unspecified: Secondary | ICD-10-CM | POA: Diagnosis not present

## 2017-07-24 DIAGNOSIS — M7550 Bursitis of unspecified shoulder: Secondary | ICD-10-CM

## 2017-07-24 DIAGNOSIS — F331 Major depressive disorder, recurrent, moderate: Secondary | ICD-10-CM

## 2017-07-24 DIAGNOSIS — M542 Cervicalgia: Secondary | ICD-10-CM | POA: Insufficient documentation

## 2017-07-24 DIAGNOSIS — M719 Bursopathy, unspecified: Secondary | ICD-10-CM | POA: Insufficient documentation

## 2017-07-24 DIAGNOSIS — M25519 Pain in unspecified shoulder: Secondary | ICD-10-CM | POA: Insufficient documentation

## 2017-07-24 MED ORDER — LAMOTRIGINE 25 MG PO TABS: 25.0000 mg | ORAL_TABLET | Freq: Every day | ORAL | 0 refills | Status: DC

## 2017-07-24 NOTE — Progress Notes (Signed)
Psychiatric Initial Adult Assessment   Patient Identification: Sara Howell MRN:  [id number] Date of Evaluation:  07/24/2017 Referral Source: Lorelle Gibbs MD Chief Complaint:  ' I am having mood symptoms." Chief Complaint    Establish Care; Anxiety; Depression; Stress     Visit Diagnosis:    ICD-10-CM   1. MDD (major depressive disorder), recurrent episode, moderate (HCC) F33.1     History of Present Illness:  Sara Howell is a 42 year old Caucasian female, married, employed, lives in [place], has a history of depression, mood lability, presented to the clinic today to establish care.  Patient reports she has a previous diagnosis of depression, anxiety ,dating back to several years ago.  Patient reports she was initially diagnosed with postpartum depression 16 years ago.  She reports at that time she took Lexapro for 5 years or so.  Patient was weaned off of the Lexapro after she felt better.  Patient most recently in November 2017 started noticing mood symptoms.  She described her symptoms as feeling sad, on edge, feeling little interest or pleasure in the things that she does, feeling down, sleeping too much, feeling tired, eating too much and some passive suicidal thoughts.  Patient reports that at that time she was started on Lexapro by her OB/GYN.  Patient was also prescribed Xanax.  Patient reports Lexapro was titrated up at some point.  Patient however became more anxious and did not tolerate it well.  Hence the Lexapro was reduced and patient was prescribed Xanax.  Patient reports she kept taking the Xanax up to 3 times a day often.  Patient however weaned herself off the Xanax more than a week ago.  Patient most recently had to go to the emergency department at [facility] because of having some suicidal thoughts.  Patient was seen by the EDP as well as psychiatrist at that time.  Patient was prescribed Wellbutrin and was discharged for outpatient follow-up.  Patient reports  she took the Wellbutrin but that caused her to have diarrhea and nervousness.  She had stopped taking it.  Patient reports the only medication she is currently on is the Lexapro.  She takes 10 mg at this time.  Patient reports other mood symptoms like irritability, anger issues and so on.  She reports she has these spurts of energy when she feels extremely high but that last only for a couple of hours or so.  Patient denies any other symptoms of bipolar disorder at this time.  She does report a history of spending sprees but reports that happens when she feels down or depressed and she wants to do something to make her happy.  Patient reports work is stressful.  She works in [employer].  She reports she works remotely.  However became so overwhelming and stressful for her she is currently on a medical leave of absence.  She reports her primary medical doctor took her out of work.  She reports she has been talking to her husband since the past several months about wanting to change her job.  She however reports her husband has not been very supportive with that.  Patient reports she sees Ms. Lady Deutscher therapist for psychotherapy.  Patient reports it is going well.    Associated Signs/Symptoms: Depression Symptoms:  depressed mood, hypersomnia, psychomotor agitation, fatigue, feelings of worthlessness/guilt, difficulty concentrating, anxiety, (Hypo) Manic Symptoms:  Impulsivity, Irritable Mood, Anxiety Symptoms:  anxiety sx Psychotic Symptoms:  denies PTSD Symptoms: Negative  Past Psychiatric History: Recent ED visit  to [facility] for suicidal ideation earlier this month.  Patient currently sees Aundra Millet.  Patient has a history of postpartum depression.  Patient was also started on Lexapro, Xanax by her OB/GYN in 2017 for depression and anxiety.  Previous Psychotropic Medications: Yes , Prozac, Wellbutrin, Lexapro, Xanax  Substance Abuse History in the last  12 months:  No.  Consequences of Substance Abuse: Negative  Past Medical History:  Past Medical History:  Diagnosis Date  . Anal fissure   . Anemia   . Anxiety   . Depression   . Hashimoto's thyroiditis 03/10/2017  . Hypothyroidism 10/12   post partum only, no further need for meds  . PONV (postoperative nausea and vomiting)   . PVC (premature ventricular contraction)    no meds no cardiologist routinely    Past Surgical History:  Procedure Laterality Date  . CERVICAL DISCECTOMY  2007   No specified location  . GANGLION CYST EXCISION Left 2011  . LAPAROSCOPY  09/12/2011   Procedure: LAPAROSCOPY OPERATIVE;  Surgeon: Marylynn Pearson, MD;  Location: [facility];  Service: Gynecology;  Laterality: Right;  right oophorectomy  . MUSCLE BIOPSY  as a teenager   non specific; biopsy  . NSVD     x 2  . WISDOM TOOTH EXTRACTION      Family Psychiatric History: Patient reports her father may have had bipolar disorder, alcoholism .  He was a Armed forces technical officer.  He was in and out of her life a lot.  She reports he would take her out and spend a lot of money and then he would disappear for some time.  She reports that he got incarcerated several times.  Maternal aunt-bipolar disorder, niece-bipolar disorder, maternal grandmother-depression,  Brother-depression  Family History:  Family History  Problem Relation Age of Onset  . Hepatitis Father        Hep C  . Hypertension Father   . Drug abuse Father   . Alcohol abuse Father   . Anxiety disorder Father   . Hypothyroidism Sister   . Crohn's disease Brother   . Diabetes Paternal Grandfather   . Leukemia Paternal Grandfather   . Seizures Maternal Aunt        Epilepsy  . Seizures Maternal Uncle        Epilepsy  . Liver cancer Maternal Uncle   . Depression Maternal Grandmother   . Anxiety disorder Maternal Grandmother   . Anxiety disorder Daughter     Social History:   Social History   Socioeconomic History  . Marital status: Married     Spouse name: jimmy  . Number of children: 3  . Years of education: Not on file  . Highest education level: High school graduate  Occupational History  . Occupation: Tree surgeon    Comment: Inside sales--- [employer]  Social Needs  . Financial resource strain: Not hard at all  . Food insecurity:    Worry: Never true    Inability: Never true  . Transportation needs:    Medical: No    Non-medical: No  Tobacco Use  . Smoking status: Never Smoker  . Smokeless tobacco: Never Used  Substance and Sexual Activity  . Alcohol use: Not Currently    Comment: Rarely  . Drug use: No  . Sexual activity: Yes    Comment: husband vasectomy  Lifestyle  . Physical activity:    Days per week: 0 days    Minutes per session: 0 min  . Stress: Only a  little  Relationships  . Social connections:    Talks on phone: Once a week    Gets together: Never    Attends religious service: Never    Active member of club or organization: No    Attends meetings of clubs or organizations: Never    Relationship status: Married  Other Topics Concern  . Not on file  Social History Narrative   Pt gets regular exercise with cardio 2 days a week and 3 classes a week.   Diet consists of fruits and veggies, doesn't like meat much and likes soda and sweet tea.    Additional Social History: Married.  She lives in [place].  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  She has 3 children aged 5, 67 and 23, 2 girls and 1 son.  She reports she was raised by her mother.  Her father was a Armed forces technical officer and also may have had struggled with bipolar disorder and he was in and out of her life a lot.  Allergies:   Allergies  Allergen Reactions  . Corn-Containing Products   . Eggs Or Egg-Derived Products     Large amounts causes tightness in chest  . Peanut-Containing Drug Products     Metabolic Disorder Labs: Lab Results  Component Value Date   HGBA1C 4.7 (L) 12/26/2016   No results found for:  PROLACTIN Lab Results  Component Value Date   CHOL 115 09/26/2015   TRIG 39 09/26/2015   HDL 44 09/26/2015   CHOLHDL 2.6 09/26/2015   VLDL 13 03/08/2008   LDLCALC 63 09/26/2015   LDLCALC 100 (H) 03/08/2008     Current Medications: Current Outpatient Medications  Medication Sig Dispense Refill  . escitalopram (LEXAPRO) 10 MG tablet Take 1 tablet (10 mg total) by mouth daily. 30 tablet 11  . levothyroxine (SYNTHROID, LEVOTHROID) 25 MCG tablet Take 1 tablet (25 mcg total) by mouth daily before breakfast. 90 tablet 1  . ranitidine (ZANTAC) 150 MG tablet Take 1 tablet (150 mg total) by mouth 2 (two) times daily. 60 tablet 11  . Vitamin D, Ergocalciferol, (DRISDOL) 50000 units CAPS capsule Take 1 capsule (50,000 Units total) by mouth 2 (two) times a week. 30 capsule 1  . lamoTRIgine (LAMICTAL) 25 MG tablet Take 1 tablet (25 mg total) by mouth daily. 30 tablet 0   No current facility-administered medications for this visit.     Neurologic: Headache: No Seizure: No Paresthesias:No  Musculoskeletal: Strength & Muscle Tone: within normal limits Gait & Station: normal Patient leans: N/A  Psychiatric Specialty Exam: Review of Systems  Psychiatric/Behavioral: Positive for depression. The patient is nervous/anxious.   All other systems reviewed and are negative.   Blood pressure (!) 141/89, pulse 81, temperature 98 F (36.7 C), temperature source Oral, weight 197 lb 6.4 oz (89.5 kg), last menstrual period 06/28/2017.Body mass index is 32.85 kg/m.  General Appearance: Casual  Eye Contact:  Fair  Speech:  Clear and Coherent  Volume:  Normal  Mood:  Anxious and Dysphoric  Affect:  Congruent  Thought Process:  Goal Directed and Descriptions of Associations: Intact  Orientation:  Full (Time, Place, and Person)  Thought Content:  Logical  Suicidal Thoughts:  No  Homicidal Thoughts:  No  Memory:  Immediate;   Good Recent;   Fair Remote;   Fair  Judgement:  Fair  Insight:  Fair   Psychomotor Activity:  Normal  Concentration:  Concentration: Fair and Attention Span: Fair  Recall:  [employer] of  Knowledge:Fair  Language: Fair  Akathisia:  No  Handed:  Right  AIMS (if indicated):  NA  Assets:  Communication Skills Desire for [place] Talents/Skills Transportation Vocational/Educational  ADL's:  Intact  Cognition: WNL  Sleep:  Sleep excessive    Treatment Plan Summary:Ambree is a 42 year old Caucasian female who is married, lives in [place], currently on [place], has a history of depression, anxiety, presented to the clinic today to establish care.  Patient continues to struggle with depressive symptoms as well as irritability and anger issues.  Patient recently had an ED visit for suicidal thoughts but currently denies it.  Patient does have biological predisposition given her family history which is positive for mental health issues.  Patient also has a lot of work-related stressors.  Patient has good social support as well as sees a therapist on a weekly basis.  Discussed medication changes and plan as noted below. Medication management and Plan as noted below R/O Bipolar disorder Patient was able to fill out the mood disorder questionnaire and scored very low on it.  MDD Continue Lexapro 10 mg p.o. daily Start Lamictal 25 mg p.o. daily Provided medication education, provided handouts.  Discussed the risk of Katherina Right syndrome while on Lamictal.  Patient advised to return to clinic in 2 weeks to titrate the dose up then if indicated. PHQ 9 equals 17   Anxiety symptoms Patient with unspecified anxiety symptoms GAD 7 equals 9 We will continue to monitor closely  Patient will continue psychotherapy with her therapist Gregary Signs taber.  Patient to sign a release to obtain medical records from [place].  Follow-up in clinic in 2 weeks or sooner if needed.  More than 50 % of the time was spent for psychoeducation  and supportive psychotherapy and care coordination. This note was generated in part or whole with voice recognition software. Voice recognition is usually quite accurate but there are transcription errors that can and very often do occur. I apologize for any typographical errors that were not detected and corrected.        Ursula Alert, MD 3/28/20193:38 PM

## 2017-07-24 NOTE — Patient Instructions (Signed)
Lamotrigine tablets What is this medicine? LAMOTRIGINE (la MOE tri jeen) is used to control seizures in adults and children with epilepsy and Lennox-Gastaut syndrome. It is also used in adults to treat bipolar disorder. This medicine may be used for other purposes; ask your health care provider or pharmacist if you have questions. COMMON BRAND NAME(S): Lamictal What should I tell my health care provider before I take this medicine? They need to know if you have any of these conditions: -a history of depression or bipolar disorder -aseptic meningitis during prior use of lamotrigine -folate deficiency -kidney disease -liver disease -suicidal thoughts, plans, or attempt; a previous suicide attempt by you or a family member -an unusual or allergic reaction to lamotrigine or other seizure medications, other medicines, foods, dyes, or preservatives -pregnant or trying to get pregnant -breast-feeding How should I use this medicine? Take this medicine by mouth with a glass of water. Follow the directions on the prescription label. Do not chew these tablets. If this medicine upsets your stomach, take it with food or milk. Take your doses at regular intervals. Do not take your medicine more often than directed. A special MedGuide will be given to you by the pharmacist with each new prescription and refill. Be sure to read this information carefully each time. Talk to your pediatrician regarding the use of this medicine in children. While this drug may be prescribed for children as young as 2 years for selected conditions, precautions do apply. Overdosage: If you think you have taken too much of this medicine contact a poison control center or emergency room at once. NOTE: This medicine is only for you. Do not share this medicine with others. What if I miss a dose? If you miss a dose, take it as soon as you can. If it is almost time for your next dose, take only that dose. Do not take double or extra  doses. What may interact with this medicine? -carbamazepine -female hormones, including contraceptive or birth control pills -methotrexate -phenobarbital -phenytoin -primidone -pyrimethamine -rifampin -trimethoprim -valproic acid This list may not describe all possible interactions. Give your health care provider a list of all the medicines, herbs, non-prescription drugs, or dietary supplements you use. Also tell them if you smoke, drink alcohol, or use illegal drugs. Some items may interact with your medicine. What should I watch for while using this medicine? Visit your doctor or health care professional for regular checks on your progress. If you take this medicine for seizures, wear a Medic Alert bracelet or necklace. Carry an identification card with information about your condition, medicines, and doctor or health care professional. It is important to take this medicine exactly as directed. When first starting treatment, your dose will need to be adjusted slowly. It may take weeks or months before your dose is stable. You should contact your doctor or health care professional if your seizures get worse or if you have any new types of seizures. Do not stop taking this medicine unless instructed by your doctor or health care professional. Stopping your medicine suddenly can increase your seizures or their severity. Contact your doctor or health care professional right away if you develop a rash while taking this medicine. Rashes may be very severe and sometimes require treatment in the hospital. Deaths from rashes have occurred. Serious rashes occur more often in children than adults taking this medicine. It is more common for these serious rashes to occur during the first 2 months of treatment, but a rash can   occur at any time. You may get drowsy, dizzy, or have blurred vision. Do not drive, use machinery, or do anything that needs mental alertness until you know how this medicine affects you.  To reduce dizzy or fainting spells, do not sit or stand up quickly, especially if you are an older patient. Alcohol can increase drowsiness and dizziness. Avoid alcoholic drinks. If you are taking this medicine for bipolar disorder, it is important to report any changes in your mood to your doctor or health care professional. If your condition gets worse, you get mentally depressed, feel very hyperactive or manic, have difficulty sleeping, or have thoughts of hurting yourself or committing suicide, you need to get help from your health care professional right away. If you are a caregiver for someone taking this medicine for bipolar disorder, you should also report these behavioral changes right away. The use of this medicine may increase the chance of suicidal thoughts or actions. Pay special attention to how you are responding while on this medicine. Your mouth may get dry. Chewing sugarless gum or sucking hard candy, and drinking plenty of water may help. Contact your doctor if the problem does not go away or is severe. Women who become pregnant while using this medicine may enroll in the [place] American Antiepileptic Drug Pregnancy Registry by calling 1-888-233-2334. This registry collects information about the safety of antiepileptic drug use during pregnancy. What side effects may I notice from receiving this medicine? Side effects that you should report to your doctor or health care professional as soon as possible: -allergic reactions like skin rash, itching or hives, swelling of the face, lips, or tongue -blurred or double vision -difficulty walking or controlling muscle movements -fever -headache, stiff neck, and sensitivity to light -painful sores in the mouth, eyes, or nose -redness, blistering, peeling or loosening of the skin, including inside the mouth -severe muscle pain -swollen lymph glands -uncontrollable eye movements -unusual bruising or bleeding -unusually weak or  tired -vomiting -worsening of mood, thoughts or actions of suicide or dying -yellowing of the eyes or skin Side effects that usually do not require medical attention (report to your doctor or health care professional if they continue or are bothersome): -diarrhea or constipation -difficulty sleeping -nausea -tremors This list may not describe all possible side effects. Call your doctor for medical advice about side effects. You may report side effects to FDA at 1-800-FDA-1088. Where should I keep my medicine? Keep out of reach of children. Store at room temperature between 15 and 30 degrees C (59 and 86 degrees F). Throw away any unused medicine after the expiration date. NOTE: This sheet is a summary. It may not cover all possible information. If you have questions about this medicine, talk to your doctor, pharmacist, or health care provider.  2018 Elsevier/Gold Standard (2015-05-18 09:29:40)  

## 2017-07-27 ENCOUNTER — Other Ambulatory Visit: Payer: Self-pay | Admitting: Internal Medicine

## 2017-07-28 DIAGNOSIS — F411 Generalized anxiety disorder: Secondary | ICD-10-CM | POA: Diagnosis not present

## 2017-07-28 DIAGNOSIS — F320 Major depressive disorder, single episode, mild: Secondary | ICD-10-CM | POA: Diagnosis not present

## 2017-07-29 ENCOUNTER — Telehealth: Payer: Self-pay | Admitting: *Deleted

## 2017-07-29 NOTE — Telephone Encounter (Signed)
Spoke with pt we discussed her FMLA paper work, she is going to get psych to fill out

## 2017-07-29 NOTE — Telephone Encounter (Signed)
Patient called and is wanting to nurse to call her back in regards to [facility] paperwork. Patient contact # is [phone]. Please advise. Thank you

## 2017-07-31 ENCOUNTER — Telehealth: Payer: Self-pay

## 2017-07-31 NOTE — Telephone Encounter (Signed)
pt called had left a message stating that she needed flma paperwork filled out.

## 2017-07-31 NOTE — Telephone Encounter (Signed)
called pt left message that our policy is that you have to be established with the doctor for at least 6 mths before the doctor would even consider doing FLMA forms.  Advised pt that she would have to get PCP to do it or wait 6 mths.

## 2017-08-04 DIAGNOSIS — F411 Generalized anxiety disorder: Secondary | ICD-10-CM | POA: Diagnosis not present

## 2017-08-07 ENCOUNTER — Ambulatory Visit: Payer: BLUE CROSS/BLUE SHIELD | Admitting: Psychiatry

## 2017-08-07 ENCOUNTER — Other Ambulatory Visit: Payer: Self-pay

## 2017-08-07 ENCOUNTER — Encounter: Payer: Self-pay | Admitting: Psychiatry

## 2017-08-07 VITALS — Temp 98.5°F | Wt 199.4 lb

## 2017-08-07 DIAGNOSIS — F419 Anxiety disorder, unspecified: Secondary | ICD-10-CM | POA: Diagnosis not present

## 2017-08-07 DIAGNOSIS — F331 Major depressive disorder, recurrent, moderate: Secondary | ICD-10-CM

## 2017-08-07 MED ORDER — LAMOTRIGINE 25 MG PO TABS: 25.0000 mg | ORAL_TABLET | Freq: Every day | ORAL | 1 refills | Status: DC

## 2017-08-07 NOTE — Patient Instructions (Signed)

## 2017-08-07 NOTE — Progress Notes (Signed)
[facility] OP Progress Note  08/07/2017 9:30 PM KEIMYA BRIDDELL  MRN:  [id number]  Chief Complaint: ' I am having some headaches.' Chief Complaint    Follow-up; Medication Refill     HPI: Sara Howell is a 42 year old Caucasian female, married, employed, lives in [place], has a history of depression, mood lability, presented to the clinic today for a follow-up visit.  Patient reports she is started the Lamictal as prescribed.  She reports she has not noticed significant changes in her mood.  She also reports some possible side effects like mind fog, easily being distracted, forgetfulness as well as possible headaches.  She however reports she does not know if the Lamictal is actually causing the symptoms or not.  She is also not sure if her headaches are due to her new medication.  She does have a history of headaches and also has history of seasonal allergies.  Discussed with patient to monitor her symptoms closely and to give the Lamictal more time.   Continues to be in psychotherapy with Ms. Greggory Keen.  She reports therapy is going well.  She continues to take leave of absence from her job.    She reports she has been spending time working around her house, doing her chores, cooking and so on.  She reports she has been able to function well doing these activities.  She does not report any significant decrease in any of these activities that she does. Discussed continuing her medications as prescribed. She will monitor self closely and let writer know.  Visit Diagnosis:    ICD-10-CM   1. MDD (major depressive disorder), recurrent episode, moderate (HCC) F33.1   2. Anxiety disorder, unspecified type F41.9     Past Psychiatric History: Recent ED visit to [facility] for suicidal ideation last month.  Patient is in psychotherapy with therapist Greggory Keen.  Patient has a history of postpartum depression.  Patient was also  on Lexapro, Xanax by her OB/GYN in 2017.  Trials of medications  like Prozac, Wellbutrin, Lexapro, Xanax  Past Medical History:  Past Medical History:  Diagnosis Date  . Anal fissure   . Anemia   . Anxiety   . Depression   . Hashimoto's thyroiditis 03/10/2017  . Hypothyroidism 10/12   post partum only, no further need for meds  . PONV (postoperative nausea and vomiting)   . PVC (premature ventricular contraction)    no meds no cardiologist routinely    Past Surgical History:  Procedure Laterality Date  . CERVICAL DISCECTOMY  2007   No specified location  . GANGLION CYST EXCISION Left 2011  . LAPAROSCOPY  09/12/2011   Procedure: LAPAROSCOPY OPERATIVE;  Surgeon: Marylynn Pearson, MD;  Location: [facility];  Service: Gynecology;  Laterality: Right;  right oophorectomy  . MUSCLE BIOPSY  as a teenager   non specific; biopsy  . NSVD     x 2  . WISDOM TOOTH EXTRACTION      Family Psychiatric History: Father-bipolar disorder, alcoholism.  He used to be a Armed forces technical officer.  She reports he was in and out of her life a lot.  He got incarcerated several times.  Maternal aunt-bipolar disorder, niece-bipolar disorder, maternal grandmother-depression, brother-depression.  Family History:  Family History  Problem Relation Age of Onset  . Hepatitis Father        Hep C  . Hypertension Father   . Drug abuse Father   . Alcohol abuse Father   . Anxiety disorder Father   .  Hypothyroidism Sister   . Crohn's disease Brother   . Diabetes Paternal Grandfather   . Leukemia Paternal Grandfather   . Seizures Maternal Aunt        Epilepsy  . Seizures Maternal Uncle        Epilepsy  . Liver cancer Maternal Uncle   . Depression Maternal Grandmother   . Anxiety disorder Maternal Grandmother   . Anxiety disorder Daughter    Substance abuse history: Denies  Social History: Married.  She lives in [place].  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  She has 3 children aged 83,16,8 and 2.  She reports she was raised by her mother.  Her father was a drug Engineer, civil (consulting).  He also may have struggled with bipolar disorder and he was in and out of her life a lot Social History   Socioeconomic History  . Marital status: Married    Spouse name: jimmy  . Number of children: 3  . Years of education: Not on file  . Highest education level: High school graduate  Occupational History  . Occupation: Tree surgeon    Comment: Inside sales--- [employer]  Social Needs  . Financial resource strain: Not hard at all  . Food insecurity:    Worry: Never true    Inability: Never true  . Transportation needs:    Medical: No    Non-medical: No  Tobacco Use  . Smoking status: Never Smoker  . Smokeless tobacco: Never Used  Substance and Sexual Activity  . Alcohol use: Not Currently    Comment: Rarely  . Drug use: No  . Sexual activity: Yes    Comment: husband vasectomy  Lifestyle  . Physical activity:    Days per week: 0 days    Minutes per session: 0 min  . Stress: Only a little  Relationships  . Social connections:    Talks on phone: Once a week    Gets together: Never    Attends religious service: Never    Active member of club or organization: No    Attends meetings of clubs or organizations: Never    Relationship status: Married  Other Topics Concern  . Not on file  Social History Narrative   Pt gets regular exercise with cardio 2 days a week and 3 classes a week.   Diet consists of fruits and veggies, doesn't like meat much and likes soda and sweet tea.    Allergies:  Allergies  Allergen Reactions  . Corn-Containing Products   . Eggs Or Egg-Derived Products     Large amounts causes tightness in chest  . Peanut-Containing Drug Products     Metabolic Disorder Labs: Lab Results  Component Value Date   HGBA1C 4.7 (L) 12/26/2016   No results found for: PROLACTIN Lab Results  Component Value Date   CHOL 115 09/26/2015   TRIG 39 09/26/2015   HDL 44 09/26/2015   CHOLHDL 2.6 09/26/2015   VLDL 13 03/08/2008   LDLCALC 63  09/26/2015   LDLCALC 100 (H) 03/08/2008   Lab Results  Component Value Date   TSH 3.580 07/15/2017   TSH 3.198 07/15/2017    Therapeutic Level Labs: No results found for: LITHIUM No results found for: VALPROATE No components found for:  CBMZ  Current Medications: Current Outpatient Medications  Medication Sig Dispense Refill  . escitalopram (LEXAPRO) 10 MG tablet Take 1 tablet (10 mg total) by mouth daily. 30 tablet 11  . lamoTRIgine (LAMICTAL) 25 MG tablet Take  1 tablet (25 mg total) by mouth daily. 30 tablet 1  . levothyroxine (SYNTHROID, LEVOTHROID) 25 MCG tablet TAKE 1 TABLET (25 MCG TOTAL) BY MOUTH DAILY BEFORE BREAKFAST. 90 tablet 3  . ranitidine (ZANTAC) 150 MG tablet Take 1 tablet (150 mg total) by mouth 2 (two) times daily. 60 tablet 11  . Vitamin D, Ergocalciferol, (DRISDOL) 50000 units CAPS capsule Take 1 capsule (50,000 Units total) by mouth 2 (two) times a week. 30 capsule 1   No current facility-administered medications for this visit.      Musculoskeletal: Strength & Muscle Tone: within normal limits Gait & Station: normal Patient leans: N/A  Psychiatric Specialty Exam: Review of Systems  Psychiatric/Behavioral: Positive for depression. The patient is nervous/anxious.   All other systems reviewed and are negative.   Temperature 98.5 F (36.9 C), temperature source Oral, weight 199 lb 6.4 oz (90.4 kg).Body mass index is 33.18 kg/m.  General Appearance: Casual  Eye Contact:  Good  Speech:  Normal Rate  Volume:  Normal  Mood:  Anxious and Dysphoric  Affect:  Congruent  Thought Process:  Goal Directed and Descriptions of Associations: Intact  Orientation:  Full (Time, Place, and Person)  Thought Content: Logical   Suicidal Thoughts:  No  Homicidal Thoughts:  No  Memory:  Immediate;   Fair Recent;   Fair Remote;   Fair  Judgement:  Fair  Insight:  Fair  Psychomotor Activity:  Normal  Concentration:  Concentration: Fair and Attention Span: Fair   Recall:  [employer] of Knowledge: Fair  Language: Fair  Akathisia:  No  Handed:  Right  AIMS (if indicated):na  Assets:  Communication Skills Desire for Improvement Housing Social Support  ADL's:  Intact  Cognition: WNL  Sleep:  Fair   Screenings: PHQ2-9     Office Visit from 07/15/2017 in [facility] Visit from 03/06/2016 in [facility]  PHQ-2 Total Score  6  3  PHQ-9 Total Score  21  7       Assessment and Plan: Symiah is a 42 year old Caucasian female who is married, lives in [place], currently on [place], has a history of depression, anxiety, presented to the clinic today for a follow-up visit.  Patient was recently started on Lamictal to augment her Lexapro.  She continues to have some vague physical symptoms like headaches and forgetfulness.  Unknown if these are due to her Lamictal.  Patient advised to keep close track of her symptoms.  Patient advised to continue the medication at the same dose and return to clinic in few weeks.  Plan as noted below.  Plan  MDD Continue Lexapro 10 mg p.o. daily Continue Lamictal 25 mg p.o. Daily  Anxiety symptoms Continues to be in therapy with her therapist Greggory Keen.  Follow-up in clinic in 3 weeks or sooner if needed.  More than 50 % of the time was spent for psychoeducation and supportive psychotherapy and care coordination.  This note was generated in part or whole with voice recognition software. Voice recognition is usually quite accurate but there are transcription errors that can and very often do occur. I apologize for any typographical errors that were not detected and corrected.         Ursula Alert, MD 08/07/2017, 9:30 PM

## 2017-08-13 DIAGNOSIS — F411 Generalized anxiety disorder: Secondary | ICD-10-CM | POA: Diagnosis not present

## 2017-08-13 DIAGNOSIS — F320 Major depressive disorder, single episode, mild: Secondary | ICD-10-CM | POA: Diagnosis not present

## 2017-08-21 IMAGING — MG DIGITAL SCREENING BILATERAL MAMMOGRAM WITH CAD
4 series · 4 of 4 positions shown · non-contrast
Comparison: None.

CLINICAL DATA: Screening.

EXAM:
DIGITAL SCREENING BILATERAL MAMMOGRAM WITH CAD

[L CC]
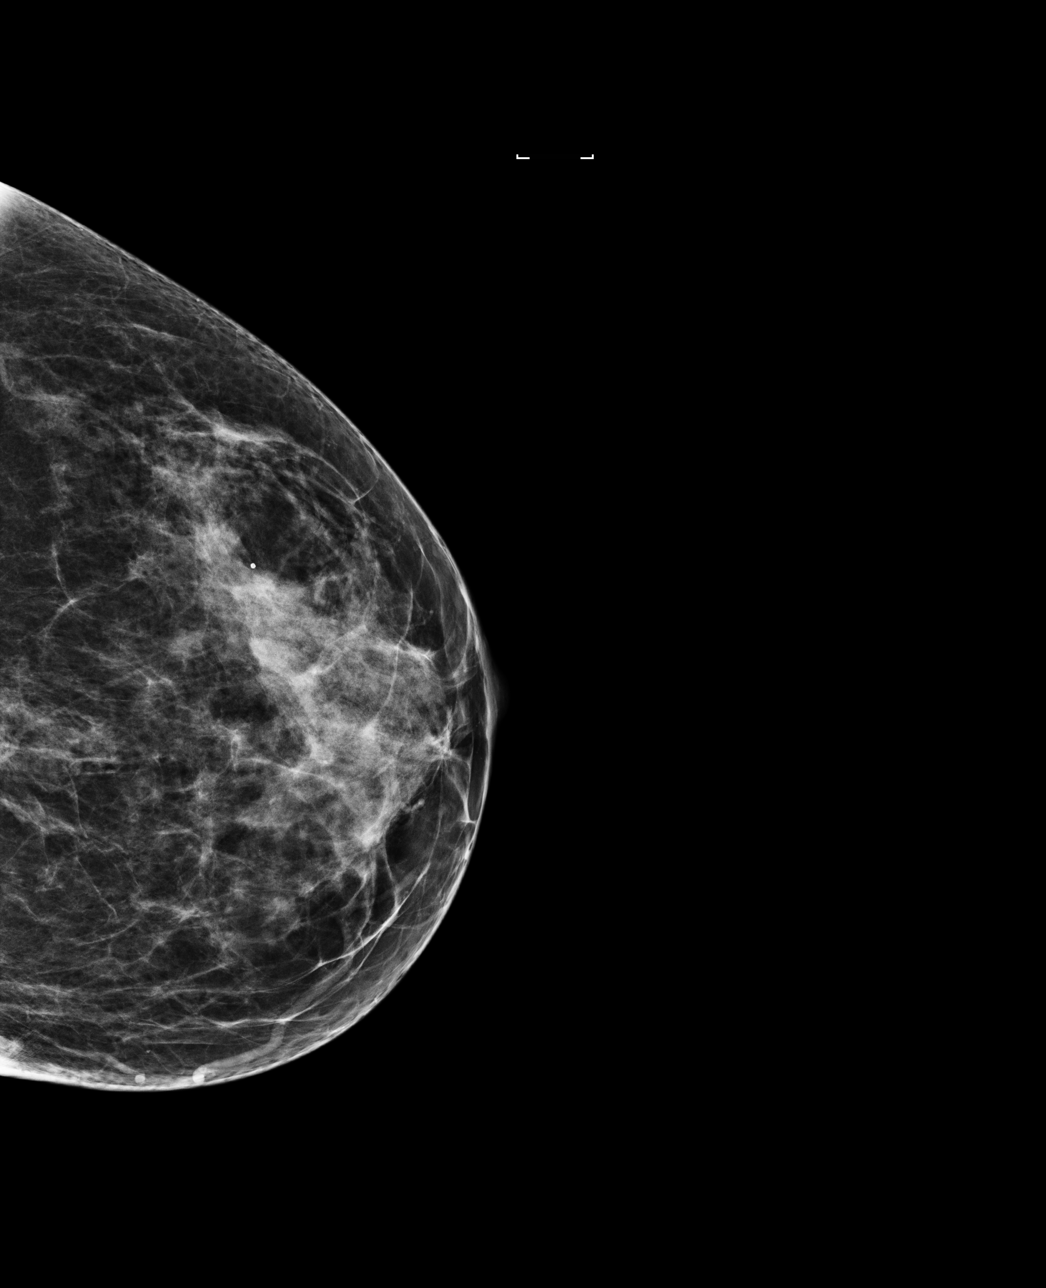

[R CC]
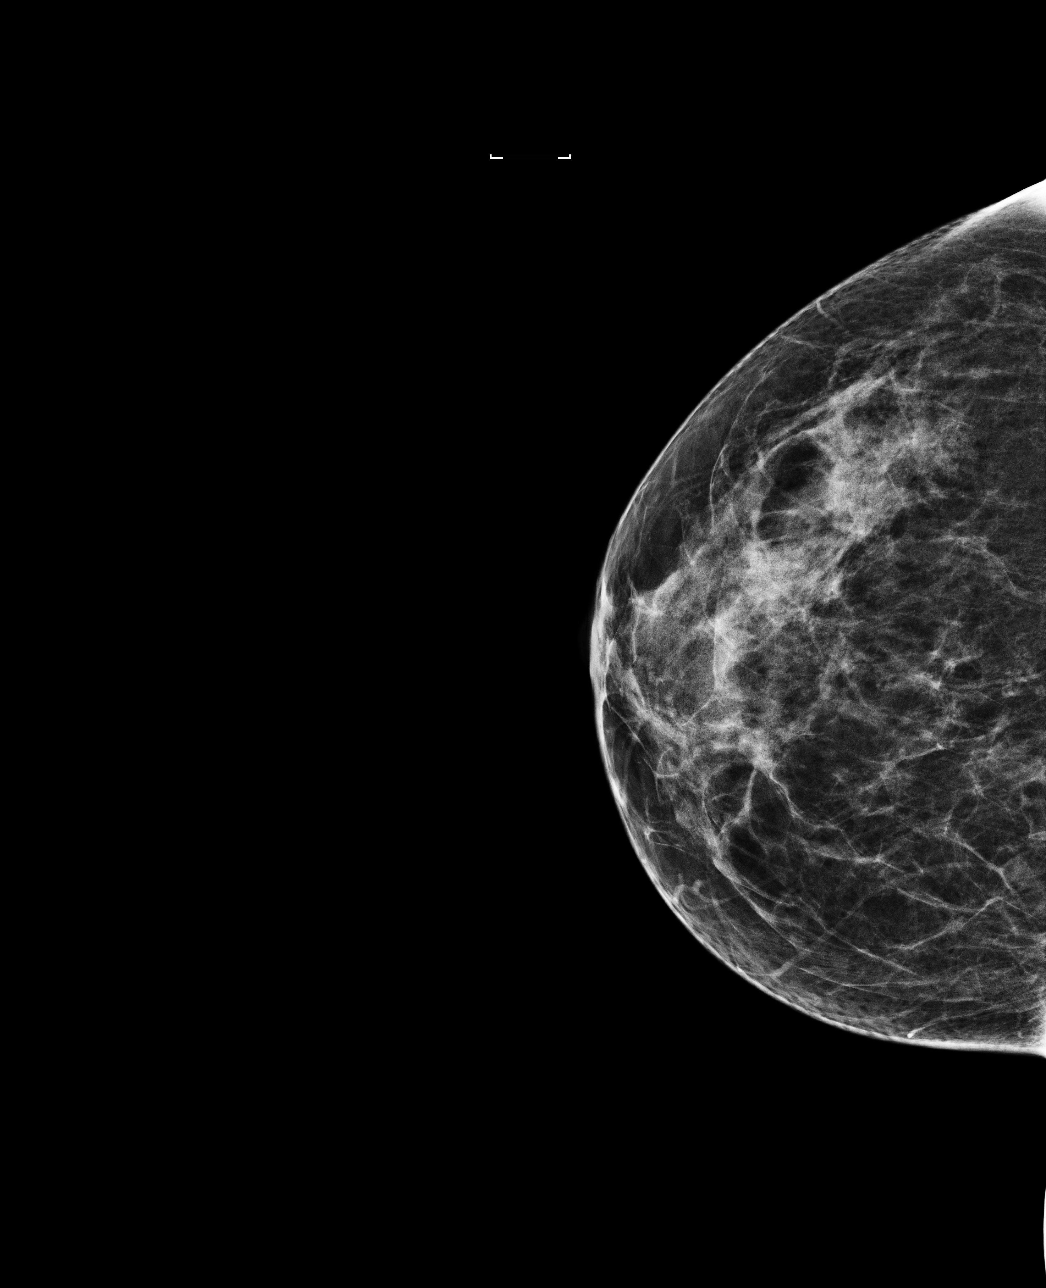

[L MLO]
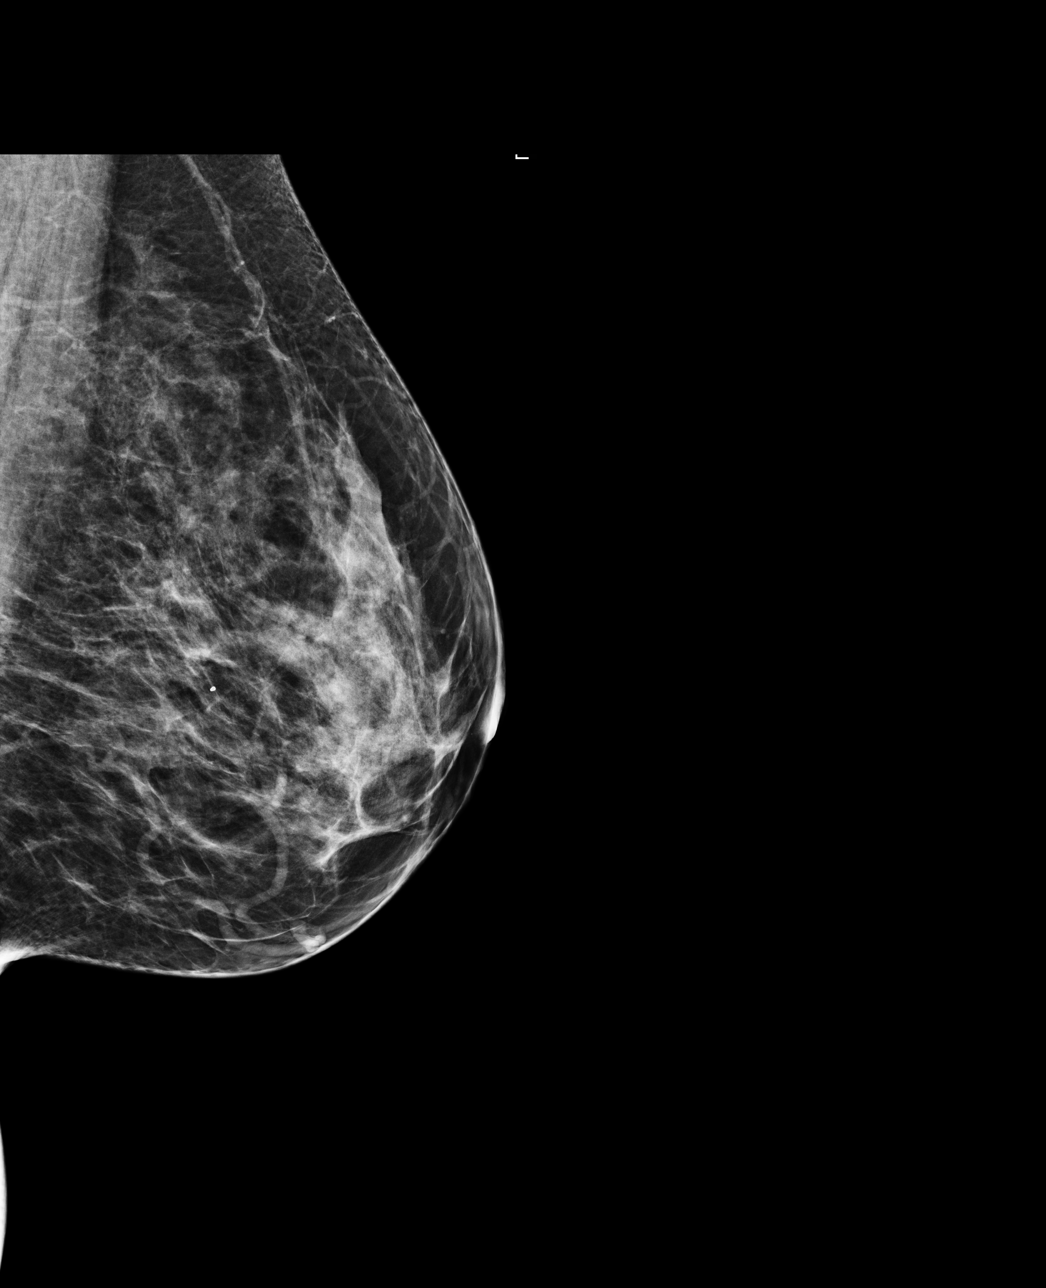

[R MLO]
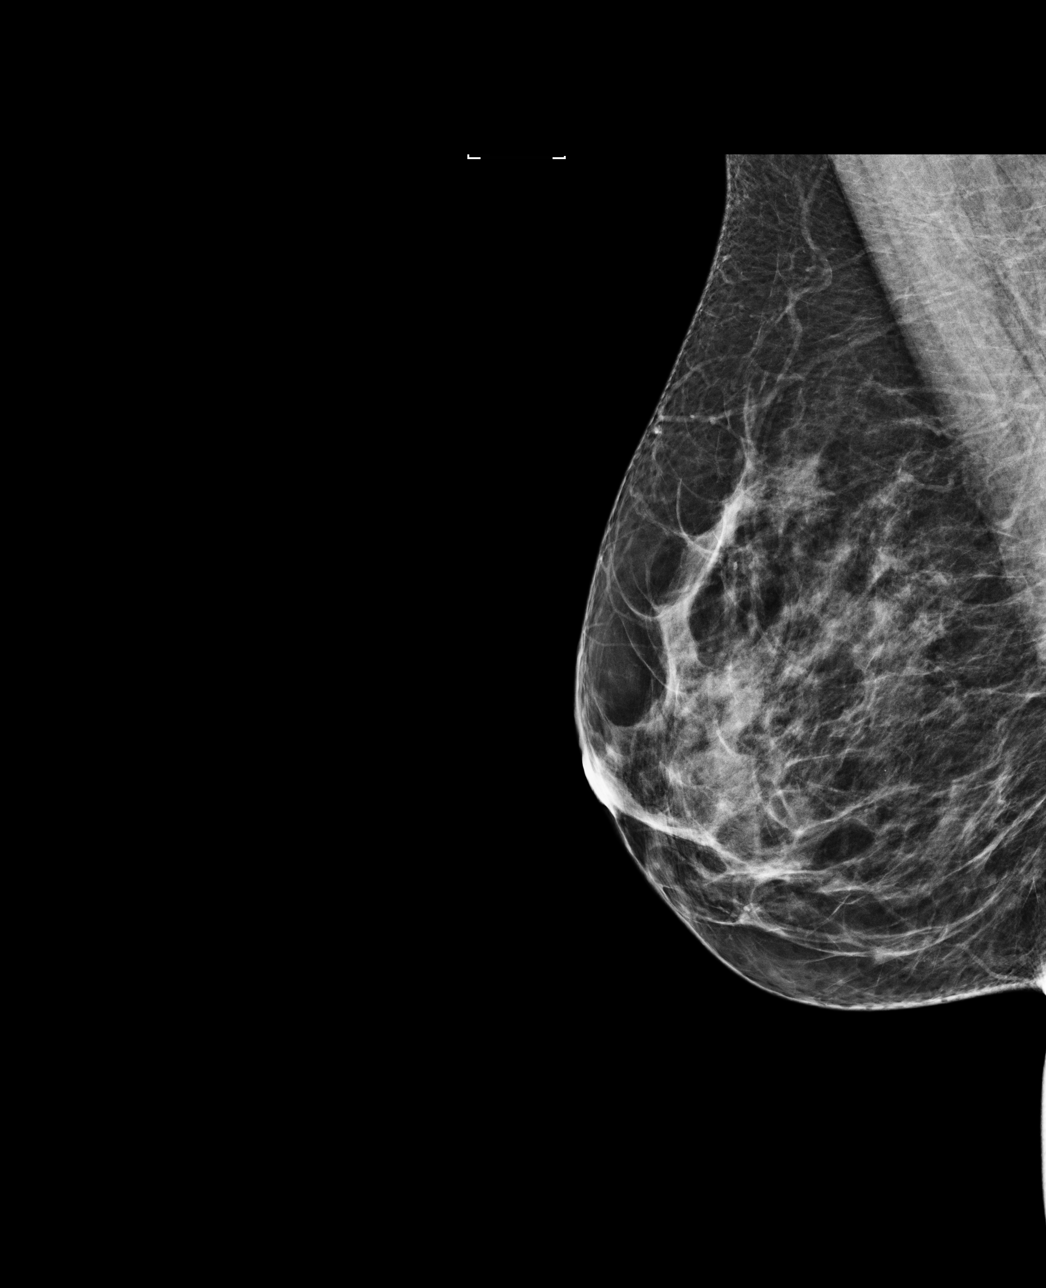

[4 of 4 positions shown; findings below may reference images not displayed]

ACR Breast Density Category c: The breast tissue is heterogeneously
dense, which may obscure small masses
FINDINGS: There are no findings suspicious for malignancy. Images were
processed with CAD.
IMPRESSION: No mammographic evidence of malignancy. A result letter of this
screening mammogram will be mailed directly to the patient.

RECOMMENDATION:
Screening mammogram in one year. (Code:[id number])

BI-RADS CATEGORY  1: Negative.

## 2017-08-28 ENCOUNTER — Encounter: Payer: Self-pay | Admitting: Obstetrics and Gynecology

## 2017-08-28 ENCOUNTER — Ambulatory Visit: Payer: BLUE CROSS/BLUE SHIELD | Admitting: Obstetrics and Gynecology

## 2017-08-28 VITALS — BP 123/83 | HR 79 | Ht 65.0 in | Wt 197.7 lb

## 2017-08-28 DIAGNOSIS — F320 Major depressive disorder, single episode, mild: Secondary | ICD-10-CM | POA: Diagnosis not present

## 2017-08-28 DIAGNOSIS — F332 Major depressive disorder, recurrent severe without psychotic features: Secondary | ICD-10-CM

## 2017-08-28 NOTE — Progress Notes (Signed)
  Subjective:     Patient ID: Nicole Kindred, female   DOB: 09/26/1975, 42 y.o.   MRN: [id number]  HPI Here for medication follow up from 07/17/17 (see visit notes) States feeling better, still out of work so that stress has not been a daily issue.  Feels like therapy is going well. Sees psychiatrist next week. Is sleeping well, and tries to be active during the day to keep from going back to bed.   Depression screen [facility] 2/9 08/28/2017 07/15/2017 03/06/2016  Decreased Interest 2 3 1   Down, Depressed, Hopeless 1 3 2   PHQ - 2 Score 3 6 3   Altered sleeping 0 3 0  Tired, decreased energy 3 3 1   Change in appetite 1 3 1   Feeling bad or failure about yourself  1 2 1   Trouble concentrating 0 2 0  Moving slowly or fidgety/restless 0 0 0  Suicidal thoughts 0 2 1  PHQ-9 Score 8 21 7   Difficult doing work/chores Not difficult at all Very difficult -    Review of Systems Negative except stated above I HPI    Objective:   Physical Exam A&Ox4 Well groomed female in no distress   Psychiatric Specialty Exam: Physical Exam  ROS  Blood pressure 123/83, pulse 79, height 5\' 5"  (1.651 m), weight 197 lb 11.2 oz (89.7 kg), last menstrual period 08/25/2017.Body mass index is 32.9 kg/m.  General Appearance: Neat and Well Groomed  Eye Contact:  Good  Speech:  Clear and Coherent  Volume:  Normal  Mood:  Negative  Affect:  Appropriate  Thought Process:  Coherent and Goal Directed  Orientation:  Full (Time, Place, and Person)  Thought Content:  Logical and Abstract Reasoning  Suicidal Thoughts:  No  Homicidal Thoughts:  no  Memory:  Immediate;   Good Recent;   Good Remote;   Good  Judgement:  Good  Insight:  Good  Psychomotor Activity:  Negative  Concentration:  Concentration: Good and Attention Span: Good  Recall:  Good  Fund of Knowledge:  Good  Language:  Good  Akathisia:  Negative  Handed:  Right  AIMS (if indicated):     Assets:  Communication Skills Desire for  Improvement Housing Resilience Social Support  ADL's:  Intact  Cognition:  WNL  Sleep:   good      Assessment:     Follow up  Major depressive episode    Plan:     Continue with current medications and therapy. Ok at this time to return to work June 10.  Melody Shambley,CNM

## 2017-09-03 DIAGNOSIS — F320 Major depressive disorder, single episode, mild: Secondary | ICD-10-CM | POA: Diagnosis not present

## 2017-09-04 ENCOUNTER — Other Ambulatory Visit: Payer: Self-pay

## 2017-09-04 ENCOUNTER — Encounter: Payer: Self-pay | Admitting: Psychiatry

## 2017-09-04 ENCOUNTER — Ambulatory Visit: Payer: BLUE CROSS/BLUE SHIELD | Admitting: Psychiatry

## 2017-09-04 VITALS — BP 128/83 | HR 72 | Temp 98.6°F | Wt 196.0 lb

## 2017-09-04 DIAGNOSIS — F331 Major depressive disorder, recurrent, moderate: Secondary | ICD-10-CM

## 2017-09-04 DIAGNOSIS — F419 Anxiety disorder, unspecified: Secondary | ICD-10-CM | POA: Diagnosis not present

## 2017-09-04 MED ORDER — LAMOTRIGINE 25 MG PO TABS: 25.0000 mg | ORAL_TABLET | Freq: Every day | ORAL | 1 refills | Status: DC

## 2017-09-04 NOTE — Progress Notes (Signed)
[facility] MD OP Progress Note  09/04/2017 3:00 PM MAGABY RUMBERGER  MRN:  [id number]  Chief Complaint: ' I am here for follow up.' Chief Complaint    Follow-up; Medication Refill     HPI: Sara Howell is a 42 year old Caucasian female, married, employed, lives in [place], has a history of depression, mood lability, presented to the clinic today for a follow-up visit.  Patient today reports she is making progress on the current medication regimen.  She reports she is compliant with the Lexapro and the Lamictal.  She reports her forgetfulness her sadness, anxiety and mood lability has improved.  She reports she continues to have some on and off headaches but they are manageable.  She reports she started taking Claritin and that has helped with her headaches a lot.  It is likely that her headaches are due to seasonal allergies.  She reports sleep as good.  She reports she has been exercising by walking 45 minutes every day.  She is also doing some intensive exercise at home.  She reports she is also watching her diet and that has been helpful.  She reports she has also started being more spiritual.  She does not follow any specific religion.  She has started reading a book that her therapist recommended called 'Do not let your emotions control you'.  She reports that has been very helpful.  She denies any suicidality.  She denies any perceptual disturbances.     Visit Diagnosis:    ICD-10-CM   1. MDD (major depressive disorder), recurrent episode, moderate (HCC) F33.1   2. Anxiety disorder, unspecified type F41.9     Past Psychiatric History: Reviewed past psychiatric history from my progress note on 08/07/2017.  Past trials of Lexapro, Xanax, Prozac, Wellbutrin.  Past Medical History:  Past Medical History:  Diagnosis Date  . Anal fissure   . Anemia   . Anxiety   . Depression   . Hashimoto's thyroiditis 03/10/2017  . Hypothyroidism 10/12   post partum only, no further need for meds  .  PONV (postoperative nausea and vomiting)   . PVC (premature ventricular contraction)    no meds no cardiologist routinely    Past Surgical History:  Procedure Laterality Date  . CERVICAL DISCECTOMY  2007   No specified location  . GANGLION CYST EXCISION Left 2011  . LAPAROSCOPY  09/12/2011   Procedure: LAPAROSCOPY OPERATIVE;  Surgeon: Marylynn Pearson, MD;  Location: [facility];  Service: Gynecology;  Laterality: Right;  right oophorectomy  . MUSCLE BIOPSY  as a teenager   non specific; biopsy  . NSVD     x 2  . WISDOM TOOTH EXTRACTION      Family Psychiatric History: Reviewed family psychiatric history from my progress note on 08/07/2017  Family History:  Family History  Problem Relation Age of Onset  . Hepatitis Father        Hep C  . Hypertension Father   . Drug abuse Father   . Alcohol abuse Father   . Anxiety disorder Father   . Hypothyroidism Sister   . Crohn's disease Brother   . Diabetes Paternal Grandfather   . Leukemia Paternal Grandfather   . Seizures Maternal Aunt        Epilepsy  . Seizures Maternal Uncle        Epilepsy  . Liver cancer Maternal Uncle   . Depression Maternal Grandmother   . Anxiety disorder Maternal Grandmother   . Anxiety disorder Daughter     Social  History: Married.  She lives in [place].  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  She has 3 children aged 88, 40 and 6.  She has 2 girls and 1 son.  She reports she was raised by her mother.  Her father was a Armed forces technical officer. Social History   Socioeconomic History  . Marital status: Married    Spouse name: jimmy  . Number of children: 3  . Years of education: Not on file  . Highest education level: High school graduate  Occupational History  . Occupation: Tree surgeon    Comment: Inside sales--- [employer]  Social Needs  . Financial resource strain: Not hard at all  . Food insecurity:    Worry: Never true    Inability: Never true  . Transportation needs:    Medical: No     Non-medical: No  Tobacco Use  . Smoking status: Never Smoker  . Smokeless tobacco: Never Used  Substance and Sexual Activity  . Alcohol use: Not Currently    Comment: Rarely  . Drug use: No  . Sexual activity: Yes    Comment: husband vasectomy  Lifestyle  . Physical activity:    Days per week: 0 days    Minutes per session: 0 min  . Stress: Only a little  Relationships  . Social connections:    Talks on phone: Once a week    Gets together: Never    Attends religious service: Never    Active member of club or organization: No    Attends meetings of clubs or organizations: Never    Relationship status: Married  Other Topics Concern  . Not on file  Social History Narrative   Pt gets regular exercise with cardio 2 days a week and 3 classes a week.   Diet consists of fruits and veggies, doesn't like meat much and likes soda and sweet tea.    Allergies:  Allergies  Allergen Reactions  . Corn-Containing Products   . Eggs Or Egg-Derived Products     Large amounts causes tightness in chest  . Peanut-Containing Drug Products     Metabolic Disorder Labs: Lab Results  Component Value Date   HGBA1C 4.7 (L) 12/26/2016   No results found for: PROLACTIN Lab Results  Component Value Date   CHOL 115 09/26/2015   TRIG 39 09/26/2015   HDL 44 09/26/2015   CHOLHDL 2.6 09/26/2015   VLDL 13 03/08/2008   LDLCALC 63 09/26/2015   LDLCALC 100 (H) 03/08/2008   Lab Results  Component Value Date   TSH 3.580 07/15/2017   TSH 3.198 07/15/2017    Therapeutic Level Labs: No results found for: LITHIUM No results found for: VALPROATE No components found for:  CBMZ  Current Medications: Current Outpatient Medications  Medication Sig Dispense Refill  . escitalopram (LEXAPRO) 10 MG tablet Take 1 tablet (10 mg total) by mouth daily. 30 tablet 11  . lamoTRIgine (LAMICTAL) 25 MG tablet Take 1 tablet (25 mg total) by mouth daily. 90 tablet 1  . levothyroxine (SYNTHROID, LEVOTHROID) 25  MCG tablet TAKE 1 TABLET (25 MCG TOTAL) BY MOUTH DAILY BEFORE BREAKFAST. 90 tablet 3  . ranitidine (ZANTAC) 150 MG tablet Take 1 tablet (150 mg total) by mouth 2 (two) times daily. 60 tablet 11  . Vitamin D, Ergocalciferol, (DRISDOL) 50000 units CAPS capsule Take 1 capsule (50,000 Units total) by mouth 2 (two) times a week. 30 capsule 1   No current facility-administered medications for this visit.  Musculoskeletal: Strength & Muscle Tone: within normal limits Gait & Station: normal Patient leans: N/A  Psychiatric Specialty Exam: Review of Systems  Psychiatric/Behavioral: Positive for depression (improving). The patient is nervous/anxious (improving).   All other systems reviewed and are negative.   Blood pressure 128/83, pulse 72, temperature 98.6 F (37 C), temperature source Oral, weight 196 lb (88.9 kg), last menstrual period 08/25/2017.Body mass index is 32.62 kg/m.  General Appearance: Casual  Eye Contact:  Fair  Speech:  Normal Rate  Volume:  Normal  Mood:  Dysphoric  Affect:  Congruent  Thought Process:  Goal Directed and Descriptions of Associations: Intact  Orientation:  Full (Time, Place, and Person)  Thought Content: Logical   Suicidal Thoughts:  No  Homicidal Thoughts:  No  Memory:  Immediate;   Fair Recent;   Fair Remote;   Fair  Judgement:  Fair  Insight:  Fair  Psychomotor Activity:  Normal  Concentration:  Concentration: Fair and Attention Span: Fair  Recall:  [employer] of Knowledge: Fair  Language: Fair  Akathisia:  No  Handed:  Right  AIMS (if indicated): na  Assets:  Communication Skills Desire for Improvement Housing Social Support Talents/Skills  ADL's:  Intact  Cognition: WNL  Sleep:  Fair   Screenings: PHQ2-9     Office Visit from 08/28/2017 in [facility] Visit from 07/15/2017 in [facility] Visit from 03/06/2016 in [facility]  PHQ-2 Total Score  3  6  3   PHQ-9 Total Score  8  21  7         Assessment and Plan: Aubery is a 42 year old Caucasian female who is married, lives in [place], currently on [place], has a history of depression, anxiety, presented to the clinic today for a follow-up visit.  Patient today reports she is improving on the current medication regimen.  She denies any  suicidality or mood lability at this time.  Continue plan as noted below.  Plan MDD Continue Lexapro 10 mg p.o. daily. Continue Lamictal 25 mg p.o. Daily PHQ 9 = 8  For anxiety symptoms She continues to be in therapy with her therapist Ms. Greggory Keen.   Follow-up in clinic in 6-8 weeks or sooner if needed.  More than 50 % of the time was spent for psychoeducation and supportive psychotherapy and care coordination.  This note was generated in part or whole with voice recognition software. Voice recognition is usually quite accurate but there are transcription errors that can and very often do occur. I apologize for any typographical errors that were not detected and corrected.        Ursula Alert, MD 09/05/2017, 8:51 AM

## 2017-09-05 ENCOUNTER — Encounter: Payer: Self-pay | Admitting: Psychiatry

## 2017-09-08 ENCOUNTER — Ambulatory Visit: Payer: BLUE CROSS/BLUE SHIELD | Admitting: Internal Medicine

## 2017-09-15 DIAGNOSIS — F320 Major depressive disorder, single episode, mild: Secondary | ICD-10-CM | POA: Diagnosis not present

## 2017-09-16 DIAGNOSIS — F320 Major depressive disorder, single episode, mild: Secondary | ICD-10-CM | POA: Diagnosis not present

## 2017-09-24 DIAGNOSIS — F320 Major depressive disorder, single episode, mild: Secondary | ICD-10-CM | POA: Diagnosis not present

## 2017-09-25 ENCOUNTER — Ambulatory Visit: Payer: BLUE CROSS/BLUE SHIELD | Admitting: Internal Medicine

## 2017-09-25 ENCOUNTER — Encounter: Payer: Self-pay | Admitting: Internal Medicine

## 2017-09-25 VITALS — BP 120/98 | HR 89 | Ht 65.0 in | Wt 199.0 lb

## 2017-09-25 DIAGNOSIS — E039 Hypothyroidism, unspecified: Secondary | ICD-10-CM | POA: Diagnosis not present

## 2017-09-25 DIAGNOSIS — E538 Deficiency of other specified B group vitamins: Secondary | ICD-10-CM

## 2017-09-25 DIAGNOSIS — E063 Autoimmune thyroiditis: Secondary | ICD-10-CM

## 2017-09-25 DIAGNOSIS — E038 Other specified hypothyroidism: Secondary | ICD-10-CM

## 2017-09-25 MED ORDER — LEVOTHYROXINE SODIUM 50 MCG PO TABS: 50.0000 ug | ORAL_TABLET | Freq: Every day | ORAL | 3 refills | Status: DC

## 2017-09-25 NOTE — Patient Instructions (Addendum)
Please start B12 vitamin 5000 mcg daily.  You can increase Levothyroxine to 50 mcg daily.  Take the thyroid hormone every day, with water, at least 30 minutes before breakfast, separated by at least 4 hours from: - acid reflux medications - calcium - iron - multivitamins  Please come back for labs in 2-3 months.  Please come back for a follow-up appointment in 6 months

## 2017-09-25 NOTE — Progress Notes (Signed)
Patient ID: Sara Howell, female   DOB: [birth year], 42 y.o.   MRN: [id number]    HPI  Sara Howell is a 42 y.o.-year-old female, returning for follow-up for subclinical hypothyroidism. In the context of Hashimoto's thyroiditis.  Last visit 6 months ago.  She has been out of work for few weeks for depression >> now feeling better and will return to work.  Pt. has been dx with hypothyroidism in 2012 after the birth of her son by a Dr. in [place].  Before last visit, she was more fatigue, stressed, gained weight; she had labs checked by her OB/GYN doctor and is returned abnormal.  Thyroid antibodies were also elevated.  She then saw her PCP who recheck the TFTs and they continue to be abnormal and she was started on Levothyroxine - started 11/2016.  Pt is on levothyroxine 25 mcg daily, taken: - in am - fasting - at least 30 min from b'fast - no Ca, Fe, MVI, PPIs - We moved to Zantac later in the day at last visit (lunchtime or later) - not on Biotin  Reviewed patient's TFTs: Lab Results  Component Value Date   TSH 3.580 07/15/2017   TSH 3.198 07/15/2017   TSH 6.690 (H) 12/26/2016   TSH 4.56 (H) 08/27/2016   TSH 4.27 12/14/2015   TSH 5.230 (H) 09/26/2015   TSH 4.82 (H) 10/26/2014   TSH 4.52 01/08/2012   TSH 3.11 01/30/2011   TSH 2.33 07/30/2010   FREET4 0.62 08/27/2016   FREET4 0.63 12/14/2015   FREET4 0.62 10/26/2014   FREET4 0.78 01/08/2012   FREET4 0.74 01/30/2011   FREET4 0.71 07/30/2010    Thyroid antibodies were elevated so I suggested to start selenium 200 mcg daily in 02/2017.  On this supplement, ab's decreased:  Component     Latest Ref Rng & Units 07/15/2017  Thyroperoxidase Ab SerPl-aCnc     0 - 34 IU/mL 168 (H)  Thyroglobulin Antibody     0.0 - 0.9 IU/mL 79.6 (H)   Component     Latest Ref Rng & Units 12/26/2016  Thyroperoxidase Ab SerPl-aCnc     0 - 34 IU/mL 293 (H)  Thyroglobulin Antibody     0.0 - 0.9 IU/mL 287.2 (H)   Pt denies: - feeling  nodules in neck - hoarseness - dysphagia - choking - SOB with lying down  She has + FH of thyroid disorders in: mother, father, sister >> hypothyroidism. No FH of thyroid cancer. No h/o radiation tx to head or neck.  No seaweed or kelp. No recent contrast studies. No herbal supplements. No Biotin use. No recent steroids use.   Pt. also has a history of perceived reactive hypoglycemia episodes (tremors, palpitations, fatigue) >> sugars not low when checked: Lowest sugar was in the 70s.  Reviewed HbA1c - normal: Lab Results  Component Value Date   HGBA1C 4.7 (L) 12/26/2016   Meals: - Breakfast: [place] sausage with egg whites or string cheese - snack: fruit - Lunch: Soup and salad - Dinner: Eats out a lot: Mostly salads, chicken, sometimes bread or pizza - Snacks: no  She started Lexapro  Recently.  She was found to have a low normal B12: Lab Results  Component Value Date   VITAMINB12 290 07/15/2017   VITAMINB12 270 12/26/2016   She also has Vitamin D def >> started on Ergocalciferol 2x a week: Lab Results  Component Value Date   VD25OH 18.3 (L) 07/15/2017   VD25OH 26.8 (L) 12/26/2016  VD25OH 25.4 (L) 09/26/2015   ROS: Constitutional: + Weight gain, + fatigue loss, no fatigue, no subjective hyperthermia, no subjective hypothermia Eyes: no blurry vision, no xerophthalmia ENT: no sore throat, + please see HPI Cardiovascular: no CP/no SOB/no palpitations/no leg swelling Respiratory: no cough/no SOB/no wheezing Gastrointestinal: no N/no V/no D/no C/+ acid reflux Musculoskeletal: no muscle aches/no joint aches Skin: no rashes, no hair loss Neurological: no tremors/no numbness/no tingling/no dizziness  I reviewed pt's medications, allergies, PMH, social hx, family hx, and changes were documented in the history of present illness. Otherwise, unchanged from my initial visit note.  Past Medical History:  Diagnosis Date  . Anal fissure   . Anemia   . Anxiety   .  Depression   . Hashimoto's thyroiditis 03/10/2017  . Hypothyroidism 10/12   post partum only, no further need for meds  . PONV (postoperative nausea and vomiting)   . PVC (premature ventricular contraction)    no meds no cardiologist routinely  History of metabolic myopathy  Past Surgical History:  Procedure Laterality Date  . CERVICAL DISCECTOMY  2007   L4, L5 vertebrae  . GANGLION CYST EXCISION Left 2011  . MUSCLE BIOPSY  as a teenager   non specific; biopsy  . NSVD     x 2  . WISDOM TOOTH EXTRACTION     Social History   Socioeconomic History  . Marital status: Married    Spouse name: jimmy  . Number of children: 3  . Years of education: Not on file  . Highest education level: High school graduate  Occupational History  . Occupation: Tree surgeon    Comment: Inside sales--- [employer]  Social Needs  . Financial resource strain: Not hard at all  . Food insecurity:    Worry: Never true    Inability: Never true  . Transportation needs:    Medical: No    Non-medical: No  Tobacco Use  . Smoking status: Never Smoker  . Smokeless tobacco: Never Used  Substance and Sexual Activity  . Alcohol use: Not Currently    Comment: Rarely  . Drug use: No  . Sexual activity: Yes    Comment: husband vasectomy  Lifestyle  . Physical activity:    Days per week: 0 days    Minutes per session: 0 min  . Stress: Only a little  Relationships  . Social connections:    Talks on phone: Once a week    Gets together: Never    Attends religious service: Never    Active member of club or organization: No    Attends meetings of clubs or organizations: Never    Relationship status: Married  . Intimate partner violence:    Fear of current or ex partner: No    Emotionally abused: No    Physically abused: No    Forced sexual activity: No  Other Topics Concern  . Not on file  Social History Narrative   Pt gets regular exercise with cardio 2 days a week and 3 classes a week.    Diet consists of fruits and veggies, doesn't like meat much and likes soda and sweet tea.   Current Outpatient Medications on File Prior to Visit  Medication Sig Dispense Refill  . escitalopram (LEXAPRO) 10 MG tablet Take 1 tablet (10 mg total) by mouth daily. 30 tablet 11  . lamoTRIgine (LAMICTAL) 25 MG tablet Take 1 tablet (25 mg total) by mouth daily. 90 tablet 1  . levothyroxine (SYNTHROID, LEVOTHROID) 25 MCG tablet  TAKE 1 TABLET (25 MCG TOTAL) BY MOUTH DAILY BEFORE BREAKFAST. 90 tablet 3  . ranitidine (ZANTAC) 150 MG tablet Take 1 tablet (150 mg total) by mouth 2 (two) times daily. 60 tablet 11  . Vitamin D, Ergocalciferol, (DRISDOL) 50000 units CAPS capsule Take 1 capsule (50,000 Units total) by mouth 2 (two) times a week. 30 capsule 1   No current facility-administered medications on file prior to visit.    Allergies  Allergen Reactions  . Corn-Containing Products   . Eggs Or Egg-Derived Products     Large amounts causes tightness in chest  . Peanut-Containing Drug Products    Family History  Problem Relation Age of Onset  . Hepatitis Father        Hep C  . Hypertension Father   . Drug abuse Father   . Alcohol abuse Father   . Anxiety disorder Father   . Hypothyroidism Sister   . Crohn's disease Brother   . Diabetes Paternal Grandfather   . Leukemia Paternal Grandfather   . Seizures Maternal Aunt        Epilepsy  . Seizures Maternal Uncle        Epilepsy  . Liver cancer Maternal Uncle   . Depression Maternal Grandmother   . Anxiety disorder Maternal Grandmother   . Anxiety disorder Daughter     PE: BP (!) 120/98   Pulse 89   Ht 5\' 5"  (1.651 m)   Wt 199 lb (90.3 kg)   SpO2 96%   BMI 33.12 kg/m  Wt Readings from Last 3 Encounters:  09/25/17 199 lb (90.3 kg)  08/28/17 197 lb 11.2 oz (89.7 kg)  07/17/17 195 lb 12.8 oz (88.8 kg)   Constitutional: overweight, in NAD Eyes: PERRLA, EOMI, no exophthalmos ENT: moist mucous membranes, no thyromegaly, no cervical  lymphadenopathy Cardiovascular: RRR, No MRG Respiratory: CTA B Gastrointestinal: abdomen soft, NT, ND, BS+ Musculoskeletal: no deformities, strength intact in all 4 Skin: moist, warm, no rashes Neurological: no tremor with outstretched hands, DTR normal in all 4  ASSESSMENT: 1. Hypothyroidism  2. Hashimoto's thyroiditis  3.  Reactive hypoglycemia  4 low vitamin B12.  PLAN:  1. Patient with long-standing hypothyroidism, on low-dose levothyroxine therapy.  She is interested in increasing the dose of levothyroxine if possible to help with weight gain. - latest thyroid labs reviewed with pt >> normal in 06/2017, but TSH is higher in the normal range so we can increase her levothyroxine dose to 50 mcg daily - As of now, she is on LT4 25 mcg daily - pt feels good on this dose. - we discussed about taking the thyroid hormone every day, with water, >30 minutes before breakfast, separated by >4 hours from acid reflux medications, calcium, iron, multivitamins. Pt. is taking it correctly now, but was taking it with Zantac before.   - We will have her come back for labs in 2.5 to 3 months  2. Hashimoto's thyroiditis -Continue selenium 200 mcg daily - Reviewed thyroid antibodies, which have decreased on selenium  3.  Reactive hypoglycemia -At last visit, we discussed d about adding carbs with breakfast, no liquids within 30 minutes from a meal, no concentrated carbs or abdomen at the end of the meal and moving dinner earlier than 7 PM  - no recent issues  4 low vitamin B12. -Reviewed latest level, which was close to the lower limit of normal -Recommended 5000 mcg vitamin B12 daily. -We will recheck her vitamin B12 in 2.5-3 mo. if still low,  she will need IM injections  Orders Placed This Encounter  Procedures  . TSH  . T4, free  . Vitamin B12   Philemon Kingdom, MD PhD [facility]

## 2017-10-01 DIAGNOSIS — F320 Major depressive disorder, single episode, mild: Secondary | ICD-10-CM | POA: Diagnosis not present

## 2017-10-02 ENCOUNTER — Encounter: Payer: Self-pay | Admitting: Obstetrics and Gynecology

## 2017-10-02 ENCOUNTER — Ambulatory Visit: Payer: BLUE CROSS/BLUE SHIELD | Admitting: Obstetrics and Gynecology

## 2017-10-02 VITALS — BP 121/68 | HR 70 | Ht 65.0 in | Wt 193.6 lb

## 2017-10-02 DIAGNOSIS — F332 Major depressive disorder, recurrent severe without psychotic features: Secondary | ICD-10-CM

## 2017-10-02 NOTE — Progress Notes (Signed)
  Subjective:     Patient ID: Sara Howell, female   DOB: [birth year], 42 y.o.   MRN: [id number]  HPI Here for medication follow up from 08/28/17 visit. States she is still doing well. Still seeing Lady Deutscher (counselor) and has put into practice coping techniques they developed. Feels comfortable restarting work on June 11th as planned. Denies any more concerns.   Depression screen [facility] 2/9 10/02/2017 08/28/2017 07/15/2017 03/06/2016  Decreased Interest 0 2 3 1   Down, Depressed, Hopeless 0 1 3 2   PHQ - 2 Score 0 3 6 3   Altered sleeping 0 0 3 0  Tired, decreased energy 1 3 3 1   Change in appetite 0 1 3 1   Feeling bad or failure about yourself  0 1 2 1   Trouble concentrating 1 0 2 0  Moving slowly or fidgety/restless 0 0 0 0  Suicidal thoughts 0 0 2 1  PHQ-9 Score 2 8 21 7   Difficult doing work/chores Somewhat difficult Not difficult at all Very difficult -   Review of Systems  Constitutional: Negative.   HENT: Negative.   Eyes: Negative.   Respiratory: Negative.   Cardiovascular: Negative.   Gastrointestinal: Negative.   Endocrine: Negative.   Genitourinary: Negative.   Musculoskeletal: Negative.   Allergic/Immunologic: Negative.   Neurological: Positive for headaches.  Hematological: Negative.   Psychiatric/Behavioral: Positive for agitation.       Objective:   Physical Exam A&Ox4 Well groomed female in no distress Blood pressure 121/68, pulse 70, height 5\' 5"  (1.651 m), weight 193 lb 9.6 oz (87.8 kg), last menstrual period 09/21/2017. PE not indicated Affect appropriate, maintanes good eye contact with smiling noted     Assessment:     depression    Plan:     Will continue with current medication and therapy Cleared to return to work on 10/07/17 ( papers faxed here- will fill out and return) RTC as needed or when annual exam done.   Melody Shambley,CNM

## 2017-10-13 DIAGNOSIS — F320 Major depressive disorder, single episode, mild: Secondary | ICD-10-CM | POA: Diagnosis not present

## 2017-10-28 ENCOUNTER — Ambulatory Visit: Payer: BLUE CROSS/BLUE SHIELD | Admitting: Psychiatry

## 2017-10-28 ENCOUNTER — Other Ambulatory Visit: Payer: Self-pay

## 2017-10-28 ENCOUNTER — Encounter: Payer: Self-pay | Admitting: Psychiatry

## 2017-10-28 VITALS — BP 128/84 | HR 73 | Temp 98.1°F | Wt 194.4 lb

## 2017-10-28 DIAGNOSIS — F419 Anxiety disorder, unspecified: Secondary | ICD-10-CM | POA: Diagnosis not present

## 2017-10-28 DIAGNOSIS — F331 Major depressive disorder, recurrent, moderate: Secondary | ICD-10-CM

## 2017-10-28 NOTE — Progress Notes (Signed)
[facility] MD OP Progress Note  10/28/2017 4:08 PM Sara Howell  MRN:  [id number]  Chief Complaint: ' I am here for follow up." Chief Complaint    Follow-up; Medication Refill     HPI: Sara Howell is a 42 year old Caucasian female, married, employed, lives in [place], has a history of depression, mood lability, presented to the clinic today for a follow-up visit.  Patient reports she was able to return back to work.  She reports work continues to be stressful but her attitude towards work has changed.  She reports she has learned some new coping techniques and that has helped.  Patient reports her mood symptoms as okay on the current medication regimen.  She reports she is taking the Lexapro and Lamictal regularly and denies any side effects.  She reports she continues to be in psychotherapy but feels like she needs something more out of it.  Discussed with patient to make a list of things that she would like to discuss with her therapist and to see if that will help her to make her sessions better.  Patient denies any suicidality.  Patient denies any perceptual disturbances.  Patient reports her family is supportive. Visit Diagnosis:    ICD-10-CM   1. MDD (major depressive disorder), recurrent episode, moderate (HCC) F33.1   2. Anxiety disorder, unspecified type F41.9     Past Psychiatric History: Have reviewed past psychiatric history from my progress note on 08/07/2017.  Past trials of Lexapro, Xanax, Prozac, Wellbutrin.  Past Medical History:  Past Medical History:  Diagnosis Date  . Anal fissure   . Anemia   . Anxiety   . Depression   . Hashimoto's thyroiditis 03/10/2017  . Hypothyroidism 10/12   post partum only, no further need for meds  . PONV (postoperative nausea and vomiting)   . PVC (premature ventricular contraction)    no meds no cardiologist routinely    Past Surgical History:  Procedure Laterality Date  . CERVICAL DISCECTOMY  2007   No specified location  .  GANGLION CYST EXCISION Left 2011  . LAPAROSCOPY  09/12/2011   Procedure: LAPAROSCOPY OPERATIVE;  Surgeon: Marylynn Pearson, MD;  Location: [facility];  Service: Gynecology;  Laterality: Right;  right oophorectomy  . MUSCLE BIOPSY  as a teenager   non specific; biopsy  . NSVD     x 2  . WISDOM TOOTH EXTRACTION      Family Psychiatric History: Reviewed Family psychiatric history from my progress note on 08/07/2017.  Family History:  Family History  Problem Relation Age of Onset  . Hepatitis Father        Hep C  . Hypertension Father   . Drug abuse Father   . Alcohol abuse Father   . Anxiety disorder Father   . Hypothyroidism Sister   . Crohn's disease Brother   . Diabetes Paternal Grandfather   . Leukemia Paternal Grandfather   . Seizures Maternal Aunt        Epilepsy  . Seizures Maternal Uncle        Epilepsy  . Liver cancer Maternal Uncle   . Depression Maternal Grandmother   . Anxiety disorder Maternal Grandmother   . Anxiety disorder Daughter    Substance abuse history: Denies  Social History: Reviewed social history from my progress note on 08/07/2017. Social History   Socioeconomic History  . Marital status: Married    Spouse name: jimmy  . Number of children: 3  . Years of education: Not on file  .  Highest education level: High school graduate  Occupational History  . Occupation: Tree surgeon    Comment: Inside sales--- [employer]  Social Needs  . Financial resource strain: Not hard at all  . Food insecurity:    Worry: Never true    Inability: Never true  . Transportation needs:    Medical: No    Non-medical: No  Tobacco Use  . Smoking status: Never Smoker  . Smokeless tobacco: Never Used  Substance and Sexual Activity  . Alcohol use: Not Currently    Comment: Rarely  . Drug use: No  . Sexual activity: Yes    Comment: husband vasectomy  Lifestyle  . Physical activity:    Days per week: 0 days    Minutes per session: 0 min  . Stress: Only a  little  Relationships  . Social connections:    Talks on phone: Once a week    Gets together: Never    Attends religious service: Never    Active member of club or organization: No    Attends meetings of clubs or organizations: Never    Relationship status: Married  Other Topics Concern  . Not on file  Social History Narrative   Pt gets regular exercise with cardio 2 days a week and 3 classes a week.   Diet consists of fruits and veggies, doesn't like meat much and likes soda and sweet tea.    Allergies:  Allergies  Allergen Reactions  . Corn-Containing Products   . Eggs Or Egg-Derived Products     Large amounts causes tightness in chest  . Peanut-Containing Drug Products     Metabolic Disorder Labs: Lab Results  Component Value Date   HGBA1C 4.7 (L) 12/26/2016   No results found for: PROLACTIN Lab Results  Component Value Date   CHOL 115 09/26/2015   TRIG 39 09/26/2015   HDL 44 09/26/2015   CHOLHDL 2.6 09/26/2015   VLDL 13 03/08/2008   LDLCALC 63 09/26/2015   LDLCALC 100 (H) 03/08/2008   Lab Results  Component Value Date   TSH 3.580 07/15/2017   TSH 3.198 07/15/2017    Therapeutic Level Labs: No results found for: LITHIUM No results found for: VALPROATE No components found for:  CBMZ  Current Medications: Current Outpatient Medications  Medication Sig Dispense Refill  . escitalopram (LEXAPRO) 10 MG tablet Take 1 tablet (10 mg total) by mouth daily. 30 tablet 11  . lamoTRIgine (LAMICTAL) 25 MG tablet Take 1 tablet (25 mg total) by mouth daily. 90 tablet 1  . levothyroxine (SYNTHROID, LEVOTHROID) 50 MCG tablet Take 1 tablet (50 mcg total) by mouth daily before breakfast. 90 tablet 3  . ranitidine (ZANTAC) 150 MG tablet Take 1 tablet (150 mg total) by mouth 2 (two) times daily. 60 tablet 11  . vitamin B-12 (CYANOCOBALAMIN) 500 MCG tablet Take 500 mcg by mouth daily.    . Vitamin D, Ergocalciferol, (DRISDOL) 50000 units CAPS capsule Take 1 capsule (50,000  Units total) by mouth 2 (two) times a week. 30 capsule 1   No current facility-administered medications for this visit.      Musculoskeletal: Strength & Muscle Tone: within normal limits Gait & Station: normal Patient leans: N/A  Psychiatric Specialty Exam: Review of Systems  Psychiatric/Behavioral: The patient is nervous/anxious.   All other systems reviewed and are negative.   Blood pressure 128/84, pulse 73, temperature 98.1 F (36.7 C), temperature source Oral, weight 194 lb 6.4 oz (88.2 kg), last menstrual period 10/20/2017.Body mass index  is 32.35 kg/m.  General Appearance: Casual  Eye Contact:  Fair  Speech:  Clear and Coherent  Volume:  Normal  Mood:  Anxious  Affect:  Congruent  Thought Process:  Goal Directed and Descriptions of Associations: Intact  Orientation:  Full (Time, Place, and Person)  Thought Content: Logical   Suicidal Thoughts:  No  Homicidal Thoughts:  No  Memory:  Immediate;   Fair Recent;   Fair Remote;   Fair  Judgement:  Fair  Insight:  Fair  Psychomotor Activity:  Normal  Concentration:  Concentration: Fair and Attention Span: Fair  Recall:  [employer] of Knowledge: Fair  Language: Fair  Akathisia:  No  Handed:  Right  AIMS (if indicated): na  Assets:  Communication Skills Desire for Improvement Social Support  ADL's:  Intact  Cognition: WNL  Sleep:  Fair   Screenings: PHQ2-9     Office Visit from 10/02/2017 in [facility] Visit from 08/28/2017 in [facility] Visit from 07/15/2017 in [facility] Visit from 03/06/2016 in [facility]  PHQ-2 Total Score  0  3  6  3   PHQ-9 Total Score  2  8  21  7        Assessment and Plan: Sara Howell is a 42 year old Caucasian female who is married, lives in [place], has a history of depression, anxiety, presented to the clinic today for a follow-up visit.  Patient has returned back to work and reports her symptoms as better.  She continues to be  in psychotherapy.  Plan as noted below.  Plan MDD Continue Lexapro 10 mg p.o. daily Continue Lamictal 25 mg p.o. Daily.  Anxiety symptoms She will continue psychotherapy with her therapist Ms. Gregary Signs taber.  Follow-up in clinic in 2 months.  More than 50 % of the time was spent for psychoeducation and supportive psychotherapy and care coordination.  This note was generated in part or whole with voice recognition software. Voice recognition is usually quite accurate but there are transcription errors that can and very often do occur. I apologize for any typographical errors that were not detected and corrected.       Ursula Alert, MD 10/29/2017, 3:44 PM

## 2017-10-29 ENCOUNTER — Encounter: Payer: Self-pay | Admitting: Psychiatry

## 2017-11-02 DIAGNOSIS — H6643 Suppurative otitis media, unspecified, bilateral: Secondary | ICD-10-CM | POA: Diagnosis not present

## 2017-11-25 ENCOUNTER — Other Ambulatory Visit: Payer: BLUE CROSS/BLUE SHIELD

## 2017-11-26 ENCOUNTER — Other Ambulatory Visit (INDEPENDENT_AMBULATORY_CARE_PROVIDER_SITE_OTHER): Payer: BLUE CROSS/BLUE SHIELD

## 2017-11-26 DIAGNOSIS — E538 Deficiency of other specified B group vitamins: Secondary | ICD-10-CM | POA: Diagnosis not present

## 2017-11-26 DIAGNOSIS — E038 Other specified hypothyroidism: Secondary | ICD-10-CM

## 2017-11-26 DIAGNOSIS — E039 Hypothyroidism, unspecified: Secondary | ICD-10-CM

## 2017-11-26 LAB — T4, FREE: Free T4: 0.99 ng/dL (ref 0.60–1.60)

## 2017-11-26 LAB — TSH: TSH: 3.19 u[IU]/mL (ref 0.35–4.50)

## 2017-11-27 LAB — VITAMIN B12: VITAMIN B 12: 600 pg/mL (ref 211–911)

## 2017-12-08 ENCOUNTER — Ambulatory Visit: Payer: BLUE CROSS/BLUE SHIELD | Admitting: Internal Medicine

## 2017-12-08 ENCOUNTER — Ambulatory Visit (INDEPENDENT_AMBULATORY_CARE_PROVIDER_SITE_OTHER)
Admission: RE | Admit: 2017-12-08 | Discharge: 2017-12-08 | Disposition: A | Discharge status: Home / Self Care | Payer: BLUE CROSS/BLUE SHIELD | Source: Ambulatory Visit | Attending: Internal Medicine | Admitting: Internal Medicine

## 2017-12-08 ENCOUNTER — Encounter: Payer: Self-pay | Admitting: Internal Medicine

## 2017-12-08 VITALS — BP 122/84 | HR 65 | Temp 98.3°F | Wt 191.0 lb

## 2017-12-08 DIAGNOSIS — M25511 Pain in right shoulder: Secondary | ICD-10-CM | POA: Diagnosis not present

## 2017-12-08 DIAGNOSIS — M542 Cervicalgia: Secondary | ICD-10-CM

## 2017-12-08 DIAGNOSIS — S199XXA Unspecified injury of neck, initial encounter: Secondary | ICD-10-CM | POA: Diagnosis not present

## 2017-12-08 MED ORDER — ORPHENADRINE CITRATE ER 100 MG PO TB12: 100.0000 mg | ORAL_TABLET | Freq: Two times a day (BID) | ORAL | 0 refills | Status: DC

## 2017-12-08 MED ORDER — NAPROXEN 500 MG PO TBEC: 500.0000 mg | DELAYED_RELEASE_TABLET | Freq: Two times a day (BID) | ORAL | 0 refills | Status: DC
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
## 2017-12-08 NOTE — Patient Instructions (Signed)
Neck Exercises Neck exercises can be important for many reasons:  They can help you to improve and maintain flexibility in your neck. This can be especially important as you age.  They can help to make your neck stronger. This can make movement easier.  They can reduce or prevent neck pain.  They may help your upper back.  Ask your health care provider which neck exercises would be best for you. Exercises Neck Press Repeat this exercise 10 times. Do it first thing in the morning and right before bed or as told by your health care provider. 1. Lie on your back on a firm bed or on the floor with a pillow under your head. 2. Use your neck muscles to push your head down on the pillow and straighten your spine. 3. Hold the position as well as you can. Keep your head facing up and your chin tucked. 4. Slowly count to 5 while holding this position. 5. Relax for a few seconds. Then repeat.  Isometric Strengthening Do a full set of these exercises 2 times a day or as told by your health care provider. 1. Sit in a supportive chair and place your hand on your forehead. 2. Push forward with your head and neck while pushing back with your hand. Hold for 10 seconds. 3. Relax. Then repeat the exercise 3 times. 4. Next, do thesequence again, this time putting your hand against the back of your head. Use your head and neck to push backward against the hand pressure. 5. Finally, do the same exercise on either side of your head, pushing sideways against the pressure of your hand.  Prone Head Lifts Repeat this exercise 5 times. Do this 2 times a day or as told by your health care provider. 1. Lie face-down, resting on your elbows so that your chest and upper back are raised. 2. Start with your head facing downward, near your chest. Position your chin either on or near your chest. 3. Slowly lift your head upward. Lift until you are looking straight ahead. Then continue lifting your head as far back as  you can stretch. 4. Hold your head up for 5 seconds. Then slowly lower it to your starting position.  Supine Head Lifts Repeat this exercise 8-10 times. Do this 2 times a day or as told by your health care provider. 1. Lie on your back, bending your knees to point to the ceiling and keeping your feet flat on the floor. 2. Lift your head slowly off the floor, raising your chin toward your chest. 3. Hold for 5 seconds. 4. Relax and repeat.  Scapular Retraction Repeat this exercise 5 times. Do this 2 times a day or as told by your health care provider. 1. Stand with your arms at your sides. Look straight ahead. 2. Slowly pull both shoulders backward and downward until you feel a stretch between your shoulder blades in your upper back. 3. Hold for 10-30 seconds. 4. Relax and repeat.  Contact a health care provider if:  Your neck pain or discomfort gets much worse when you do an exercise.  Your neck pain or discomfort does not improve within 2 hours after you exercise. If you have any of these problems, stop exercising right away. Do not do the exercises again unless your health care provider says that you can. Get help right away if:  You develop sudden, severe neck pain. If this happens, stop exercising right away. Do not do the exercises again unless your   health care provider says that you can. Exercises Neck Stretch  Repeat this exercise 3-5 times. 1. Do this exercise while standing or while sitting in a chair. 2. Place your feet flat on the floor, shoulder-width apart. 3. Slowly turn your head to the right. Turn it all the way to the right so you can look over your right shoulder. Do not tilt or tip your head. 4. Hold this position for 10-30 seconds. 5. Slowly turn your head to the left, to look over your left shoulder. 6. Hold this position for 10-30 seconds.  Neck Retraction Repeat this exercise 8-10 times. Do this 3-4 times a day or as told by your health care  provider. 1. Do this exercise while standing or while sitting in a sturdy chair. 2. Look straight ahead. Do not bend your neck. 3. Use your fingers to push your chin backward. Do not bend your neck for this movement. Continue to face straight ahead. If you are doing the exercise properly, you will feel a slight sensation in your throat and a stretch at the back of your neck. 4. Hold the stretch for 1-2 seconds. Relax and repeat.  This information is not intended to replace advice given to you by your health care provider. Make sure you discuss any questions you have with your health care provider. Document Released: 03/27/2015 Document Revised: 09/21/2015 Document Reviewed: 10/24/2014 Elsevier Interactive Patient Education  2018 [employer]  

## 2017-12-31 ENCOUNTER — Encounter: Payer: Self-pay | Admitting: Obstetrics and Gynecology

## 2017-12-31 ENCOUNTER — Ambulatory Visit (INDEPENDENT_AMBULATORY_CARE_PROVIDER_SITE_OTHER): Payer: BLUE CROSS/BLUE SHIELD | Admitting: Obstetrics and Gynecology

## 2017-12-31 VITALS — BP 134/84 | HR 97 | Ht 65.0 in | Wt 194.1 lb

## 2017-12-31 DIAGNOSIS — R12 Heartburn: Secondary | ICD-10-CM

## 2017-12-31 DIAGNOSIS — E039 Hypothyroidism, unspecified: Secondary | ICD-10-CM

## 2017-12-31 DIAGNOSIS — E559 Vitamin D deficiency, unspecified: Secondary | ICD-10-CM | POA: Diagnosis not present

## 2017-12-31 DIAGNOSIS — R102 Pelvic and perineal pain: Secondary | ICD-10-CM

## 2017-12-31 DIAGNOSIS — Z01419 Encounter for gynecological examination (general) (routine) without abnormal findings: Secondary | ICD-10-CM

## 2017-12-31 DIAGNOSIS — F32A Depression, unspecified: Secondary | ICD-10-CM

## 2017-12-31 DIAGNOSIS — F329 Major depressive disorder, single episode, unspecified: Secondary | ICD-10-CM

## 2017-12-31 MED ORDER — ERGOCALCIFEROL 62.5 MCG (2500 UT) PO CAPS: 1.0000 | ORAL_CAPSULE | Freq: Every day | ORAL | 4 refills | Status: DC

## 2017-12-31 MED ORDER — PANTOPRAZOLE SODIUM 40 MG PO TBEC: 40.0000 mg | DELAYED_RELEASE_TABLET | Freq: Every day | ORAL | 6 refills | Status: DC

## 2017-12-31 NOTE — Patient Instructions (Addendum)
Preventive Care 18-39 Years, Female Preventive care refers to lifestyle choices and visits with your health care provider that can promote health and wellness. What does preventive care include?  A yearly physical exam. This is also called an annual well check.  Dental exams once or twice a year.  Routine eye exams. Ask your health care provider how often you should have your eyes checked.  Personal lifestyle choices, including: ? Daily care of your teeth and gums. ? Regular physical activity. ? Eating a healthy diet. ? Avoiding tobacco and drug use. ? Limiting alcohol use. ? Practicing safe sex. ? Taking vitamin and mineral supplements as recommended by your health care provider. What happens during an annual well check? The services and screenings done by your health care provider during your annual well check will depend on your age, overall health, lifestyle risk factors, and family history of disease. Counseling Your health care provider may ask you questions about your:  Alcohol use.  Tobacco use.  Drug use.  Emotional well-being.  Home and relationship well-being.  Sexual activity.  Eating habits.  Work and work Statistician.  Method of birth control.  Menstrual cycle.  Pregnancy history.  Screening You may have the following tests or measurements:  Height, weight, and BMI.  Diabetes screening. This is done by checking your blood sugar (glucose) after you have not eaten for a while (fasting).  Blood pressure.  Lipid and cholesterol levels. These may be checked every 5 years starting at age 38.  Skin check.  Hepatitis C blood test.  Hepatitis B blood test.  Sexually transmitted disease (STD) testing.  BRCA-related cancer screening. This may be done if you have a family history of breast, ovarian, tubal, or peritoneal cancers.  Pelvic exam and Pap test. This may be done every 3 years starting at age 38. Starting at age 30, this may be done  every 5 years if you have a Pap test in combination with an HPV test.  Discuss your test results, treatment options, and if necessary, the need for more tests with your health care provider. Vaccines Your health care provider may recommend certain vaccines, such as:  Influenza vaccine. This is recommended every year.  Tetanus, diphtheria, and acellular pertussis (Tdap, Td) vaccine. You may need a Td booster every 10 years.  Varicella vaccine. You may need this if you have not been vaccinated.  HPV vaccine. If you are 39 or younger, you may need three doses over 6 months.  Measles, mumps, and rubella (MMR) vaccine. You may need at least one dose of MMR. You may also need a second dose.  Pneumococcal 13-valent conjugate (PCV13) vaccine. You may need this if you have certain conditions and were not previously vaccinated.  Pneumococcal polysaccharide (PPSV23) vaccine. You may need one or two doses if you smoke cigarettes or if you have certain conditions.  Meningococcal vaccine. One dose is recommended if you are age 68-21 years and a first-year college student living in a residence hall, or if you have one of several medical conditions. You may also need additional booster doses.  Hepatitis A vaccine. You may need this if you have certain conditions or if you travel or work in places where you may be exposed to hepatitis A.  Hepatitis B vaccine. You may need this if you have certain conditions or if you travel or work in places where you may be exposed to hepatitis B.  Haemophilus influenzae type b (Hib) vaccine. You may need this  if you have certain risk factors.  Talk to your health care provider about which screenings and vaccines you need and how often you need them. This information is not intended to replace advice given to you by your health care provider. Make sure you discuss any questions you have with your health care provider. Document Released: 06/11/2001 Document Revised:  01/03/2016 Document Reviewed: 02/14/2015 Elsevier Interactive Patient Education  2018 [employer].  Abdominal Pain, Adult Abdominal pain can be caused by many things. Often, abdominal pain is not serious and it gets better with no treatment or by being treated at home. However, sometimes abdominal pain is serious. Your health care provider will do a medical history and a physical exam to try to determine the cause of your abdominal pain. Follow these instructions at home:  Take over-the-counter and prescription medicines only as told by your health care provider. Do not take a laxative unless told by your health care provider.  Drink enough fluid to keep your urine clear or pale yellow.  Watch your condition for any changes.  Keep all follow-up visits as told by your health care provider. This is important. Contact a health care provider if:  Your abdominal pain changes or gets worse.  You are not hungry or you lose weight without trying.  You are constipated or have diarrhea for more than 2-3 days.  You have pain when you urinate or have a bowel movement.  Your abdominal pain wakes you up at night.  Your pain gets worse with meals, after eating, or with certain foods.  You are throwing up and cannot keep anything down.  You have a fever. Get help right away if:  Your pain does not go away as soon as your health care provider told you to expect.  You cannot stop throwing up.  Your pain is only in areas of the abdomen, such as the right side or the left lower portion of the abdomen.  You have bloody or black stools, or stools that look like tar.  You have severe pain, cramping, or bloating in your abdomen.  You have signs of dehydration, such as: ? Dark urine, very little urine, or no urine. ? Cracked lips. ? Dry mouth. ? Sunken eyes. ? Sleepiness. ? Weakness. This information is not intended to replace advice given to you by your health care provider. Make sure you  discuss any questions you have with your health care provider. Document Released: 01/23/2005 Document Revised: 11/03/2015 Document Reviewed: 09/27/2015 Elsevier Interactive Patient Education  2018 [employer].  Gastroesophageal Reflux Disease, Adult Normally, food travels down the esophagus and stays in the stomach to be digested. If a person has gastroesophageal reflux disease (GERD), food and stomach acid move back up into the esophagus. When this happens, the esophagus becomes sore and swollen (inflamed). Over time, GERD can make small holes (ulcers) in the lining of the esophagus. Follow these instructions at home: Diet  Follow a diet as told by your doctor. You may need to avoid foods and drinks such as: ? Coffee and tea (with or without caffeine). ? Drinks that contain alcohol. ? Energy drinks and sports drinks. ? Carbonated drinks or sodas. ? Chocolate and cocoa. ? Peppermint and mint flavorings. ? Garlic and onions. ? Horseradish. ? Spicy and acidic foods, such as peppers, chili powder, curry powder, vinegar, hot sauces, and BBQ sauce. ? Citrus fruit juices and citrus fruits, such as oranges, lemons, and limes. ? Tomato-based foods, such as red sauce,  chili, salsa, and pizza with red sauce. ? Fried and fatty foods, such as donuts, french fries, potato chips, and high-fat dressings. ? High-fat meats, such as hot dogs, rib eye steak, sausage, ham, and bacon. ? High-fat dairy items, such as whole milk, butter, and cream cheese.  Eat small meals often. Avoid eating large meals.  Avoid drinking large amounts of liquid with your meals.  Avoid eating meals during the 2-3 hours before bedtime.  Avoid lying down right after you eat.  Do not exercise right after you eat. General instructions  Pay attention to any changes in your symptoms.  Take over-the-counter and prescription medicines only as told by your doctor. Do not take aspirin, ibuprofen, or other NSAIDs unless your  doctor says it is okay.  Do not use any tobacco products, including cigarettes, chewing tobacco, and e-cigarettes. If you need help quitting, ask your doctor.  Wear loose clothes. Do not wear anything tight around your waist.  Raise (elevate) the head of your bed about 6 inches (15 cm).  Try to lower your stress. If you need help doing this, ask your doctor.  If you are overweight, lose an amount of weight that is healthy for you. Ask your doctor about a safe weight loss goal.  Keep all follow-up visits as told by your doctor. This is important. Contact a doctor if:  You have new symptoms.  You lose weight and you do not know why it is happening.  You have trouble swallowing, or it hurts to swallow.  You have wheezing or a cough that keeps happening.  Your symptoms do not get better with treatment.  You have a hoarse voice. Get help right away if:  You have pain in your arms, neck, jaw, teeth, or back.  You feel sweaty, dizzy, or light-headed.  You have chest pain or shortness of breath.  You throw up (vomit) and your throw up looks like blood or coffee grounds.  You pass out (faint).  Your poop (stool) is bloody or black.  You cannot swallow, drink, or eat. This information is not intended to replace advice given to you by your health care provider. Make sure you discuss any questions you have with your health care provider. Document Released: 10/02/2007 Document Revised: 09/21/2015 Document Reviewed: 08/10/2014 Elsevier Interactive Patient Education  [employer].
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
## 2018-01-01 ENCOUNTER — Encounter: Payer: BLUE CROSS/BLUE SHIELD | Admitting: Obstetrics and Gynecology

## 2018-01-01 LAB — LIPID PANEL
Chol/HDL Ratio: 3.2 ratio (ref 0.0–4.4)
Cholesterol, Total: 166 mg/dL (ref 100–199)
HDL: 52 mg/dL (ref >39)
LDL Calculated: 98 mg/dL (ref 0–99)
TRIGLYCERIDES: 79 mg/dL (ref 0–149)
VLDL CHOLESTEROL CAL: 16 mg/dL (ref 5–40)

## 2018-01-01 LAB — COMPREHENSIVE METABOLIC PANEL
A/G RATIO: 1.8 (ref 1.2–2.2)
ALBUMIN: 4.5 g/dL (ref 3.5–5.5)
ALT: 20 IU/L (ref 0–32)
AST: 26 IU/L (ref 0–40)
Alkaline Phosphatase: 67 IU/L (ref 39–117)
BILIRUBIN TOTAL: 0.6 mg/dL (ref 0.0–1.2)
BUN / CREAT RATIO: 16 (ref 9–23)
BUN: 12 mg/dL (ref 6–24)
CHLORIDE: 103 mmol/L (ref 96–106)
CO2: 21 mmol/L (ref 20–29)
Calcium: 10 mg/dL (ref 8.7–10.2)
Creatinine, Ser: 0.76 mg/dL (ref 0.57–1.00)
GFR calc non Af Amer: 97 mL/min/{1.73_m2} (ref >59)
GFR, EST AFRICAN AMERICAN: 112 mL/min/{1.73_m2} (ref >59)
Globulin, Total: 2.5 g/dL (ref 1.5–4.5)
Glucose: 89 mg/dL (ref 65–99)
POTASSIUM: 4.2 mmol/L (ref 3.5–5.2)
SODIUM: 141 mmol/L (ref 134–144)
TOTAL PROTEIN: 7 g/dL (ref 6.0–8.5)

## 2018-01-01 LAB — CBC
Hematocrit: 40.6 % (ref 34.0–46.6)
Hemoglobin: 13.9 g/dL (ref 11.1–15.9)
MCH: 31.2 pg (ref 26.6–33.0)
MCHC: 34.2 g/dL (ref 31.5–35.7)
MCV: 91 fL (ref 79–97)
PLATELETS: 226 10*3/uL (ref 150–450)
RBC: 4.45 x10E6/uL (ref 3.77–5.28)
RDW: 12.8 % (ref 12.3–15.4)
WBC: 7.4 10*3/uL (ref 3.4–10.8)

## 2018-01-01 LAB — THYROID PANEL WITH TSH
FREE THYROXINE INDEX: 1.7 (ref 1.2–4.9)
T3 UPTAKE RATIO: 24 % (ref 24–39)
T4 TOTAL: 7 ug/dL (ref 4.5–12.0)
TSH: 2.97 u[IU]/mL (ref 0.450–4.500)

## 2018-01-01 LAB — VITAMIN D 25 HYDROXY (VIT D DEFICIENCY, FRACTURES): Vit D, 25-Hydroxy: 37.3 ng/mL (ref 30.0–100.0)

## 2018-01-02 ENCOUNTER — Other Ambulatory Visit: Payer: Self-pay | Admitting: Obstetrics and Gynecology

## 2018-01-05 ENCOUNTER — Ambulatory Visit: Payer: BLUE CROSS/BLUE SHIELD | Admitting: Psychiatry

## 2018-01-14 ENCOUNTER — Ambulatory Visit: Payer: BLUE CROSS/BLUE SHIELD | Admitting: Psychiatry

## 2018-03-04 DIAGNOSIS — L309 Dermatitis, unspecified: Secondary | ICD-10-CM | POA: Diagnosis not present

## 2018-03-27 ENCOUNTER — Ambulatory Visit: Payer: BLUE CROSS/BLUE SHIELD | Admitting: Internal Medicine

## 2018-05-20 ENCOUNTER — Telehealth: Payer: Self-pay

## 2018-05-20 NOTE — Telephone Encounter (Signed)
okay

## 2018-05-20 NOTE — Telephone Encounter (Signed)
Pt said for couple of months on and off pt has had chest discomfort like a pulling in the chest. Pt has been taking OTC acid reflux med.pt also having SOB but pt said she has gained wt and attributed the SOB on exertion to that. 2 wks ago pt woke up and felt like she had stopped breathing. No symptoms now but pt wants to see Dr Silvio Pate. Pt scheduled 05/21/18 at 2pm(pt will come few mins early to ck in prior to appt time). Pt will cb if symptoms reoccur prior to appt. FYI to Dr Silvio Pate.

## 2018-05-21 ENCOUNTER — Ambulatory Visit: Payer: BLUE CROSS/BLUE SHIELD | Admitting: Internal Medicine

## 2018-05-21 ENCOUNTER — Encounter: Payer: Self-pay | Admitting: Internal Medicine

## 2018-05-21 VITALS — BP 104/76 | HR 74 | Temp 97.8°F | Ht 65.0 in | Wt 196.0 lb

## 2018-05-21 DIAGNOSIS — K219 Gastro-esophageal reflux disease without esophagitis: Secondary | ICD-10-CM | POA: Insufficient documentation

## 2018-05-21 DIAGNOSIS — L2389 Allergic contact dermatitis due to other agents: Secondary | ICD-10-CM | POA: Diagnosis not present

## 2018-05-21 DIAGNOSIS — R079 Chest pain, unspecified: Secondary | ICD-10-CM

## 2018-05-21 DIAGNOSIS — K2100 Gastro-esophageal reflux disease with esophagitis, without bleeding: Secondary | ICD-10-CM

## 2018-05-21 MED ORDER — OMEPRAZOLE 20 MG PO CPDR: 20.0000 mg | DELAYED_RELEASE_CAPSULE | Freq: Every day | ORAL | 11 refills | Status: DC
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
## 2018-05-21 NOTE — Assessment & Plan Note (Signed)
Fairly severe episode awakening her recently indicates esophageal damage (even though overall symptoms were better since switch to nexium) No regular dysphagia Will change to omeprazole bid for now---then change to daily in 2-4 weeks If ongoing symptoms or dysphagia---GI referral

## 2018-05-21 NOTE — Patient Instructions (Signed)
Take the omeprazole 20mg  twice a day on an empty stomach. If your symptoms are gone, you can cut back to once a day (~3-4 weeks from now). Then you can consider taking less often (3-5 times a week) in a few months if you have no symptoms. Don't eat after dinner!! You may want to elevated the head of your bed.

## 2018-05-21 NOTE — Assessment & Plan Note (Addendum)
Seems fairly classic for reflux related I am concerned about her feeling of "PVC's" when she exercises (though not often) Will check EKG---sinus @65 . Left axis deviation. Normal intervals. No ischemic changes. No comparison available  Doubt further action needed about those symptoms

## 2018-09-28 ENCOUNTER — Encounter: Payer: Self-pay | Admitting: Internal Medicine

## 2018-09-28 ENCOUNTER — Ambulatory Visit (INDEPENDENT_AMBULATORY_CARE_PROVIDER_SITE_OTHER): Payer: BLUE CROSS/BLUE SHIELD | Admitting: Internal Medicine

## 2018-09-28 VITALS — Wt 192.0 lb

## 2018-09-28 DIAGNOSIS — E669 Obesity, unspecified: Secondary | ICD-10-CM | POA: Diagnosis not present

## 2018-09-28 MED ORDER — PHENTERMINE HCL 15 MG PO CAPS: 15.0000 mg | ORAL_CAPSULE | ORAL | 5 refills | Status: DC

## 2018-09-28 NOTE — Assessment & Plan Note (Signed)
She is doing the right things with eating and exercise Gained  weight with pregnancies, but mostly with the Hashimoto's and getting that regulated She did well with low dose phentermine in the past---will try this again Consider liraglutide if that doesn't help

## 2018-09-28 NOTE — Progress Notes (Signed)
Subjective:    Patient ID: Sara Howell, female    DOB: 07-Aug-1975, 43 y.o.   MRN: [id number]  HPI Virtual visit due to concerns about weight and thyroid Identification done Reviewed billing and she gave consent She is staying with friends at [facility], I am in my office  She feels her thyroid has stabilized but she continues to have trouble losing weight Doing weight watchers, resistance training, walking 2 miles a day Frustrated by the inability to lose weight Feels better with the exercise though Has discussed this briefly with Dr Cruzita Lederer  Current Outpatient Medications on File Prior to Visit  Medication Sig Dispense Refill  . Ergocalciferol 2500 units CAPS Take 1 capsule by mouth daily. 120 capsule 4  . levothyroxine (SYNTHROID, LEVOTHROID) 50 MCG tablet Take 1 tablet (50 mcg total) by mouth daily before breakfast. 90 tablet 3  . omeprazole (PRILOSEC) 20 MG capsule Take 1 capsule (20 mg total) by mouth daily. 60 capsule 11  . vitamin B-12 (CYANOCOBALAMIN) 500 MCG tablet Take 500 mcg by mouth daily.     No current facility-administered medications on file prior to visit.     Allergies  Allergen Reactions  . Corn-Containing Products   . Eggs Or Egg-Derived Products     Large amounts causes tightness in chest  . Other     Honey Chest tightness  . Peanut-Containing Drug Products     Past Medical History:  Diagnosis Date  . Anal fissure   . Anemia   . Anxiety   . Depression   . Hashimoto's thyroiditis 03/10/2017  . Hypothyroidism 10/12   post partum only, no further need for meds  . PONV (postoperative nausea and vomiting)   . PVC (premature ventricular contraction)    no meds no cardiologist routinely    Past Surgical History:  Procedure Laterality Date  . CERVICAL DISCECTOMY  2007   No specified location  . GANGLION CYST EXCISION Left 2011  . LAPAROSCOPY  09/12/2011   Procedure: LAPAROSCOPY OPERATIVE;  Surgeon: Marylynn Pearson, MD;  Location: [facility];   Service: Gynecology;  Laterality: Right;  right oophorectomy  . MUSCLE BIOPSY  as a teenager   non specific; biopsy  . NSVD     x 2  . WISDOM TOOTH EXTRACTION      Family History  Problem Relation Age of Onset  . Hepatitis Father        Hep C  . Hypertension Father   . Drug abuse Father   . Alcohol abuse Father   . Anxiety disorder Father   . Hypothyroidism Sister   . Crohn's disease Brother   . Diabetes Paternal Grandfather   . Leukemia Paternal Grandfather   . Seizures Maternal Aunt        Epilepsy  . Seizures Maternal Uncle        Epilepsy  . Liver cancer Maternal Uncle   . Depression Maternal Grandmother   . Anxiety disorder Maternal Grandmother   . Anxiety disorder Daughter     Social History   Socioeconomic History  . Marital status: Married    Spouse name: jimmy  . Number of children: 3  . Years of education: Not on file  . Highest education level: High school graduate  Occupational History  . Occupation: Tree surgeon    Comment: Inside sales--- [employer]  Social Needs  . Financial resource strain: Not hard at all  . Food insecurity:    Worry: Never true    Inability:  Never true  . Transportation needs:    Medical: No    Non-medical: No  Tobacco Use  . Smoking status: Never Smoker  . Smokeless tobacco: Never Used  Substance and Sexual Activity  . Alcohol use: Not Currently    Comment: Rarely  . Drug use: No  . Sexual activity: Yes    Comment: husband vasectomy  Lifestyle  . Physical activity:    Days per week: 0 days    Minutes per session: 0 min  . Stress: Only a little  Relationships  . Social connections:    Talks on phone: Once a week    Gets together: Never    Attends religious service: Never    Active member of club or organization: No    Attends meetings of clubs or organizations: Never    Relationship status: Married  . Intimate partner violence:    Fear of current or ex partner: No    Emotionally abused: No     Physically abused: No    Forced sexual activity: No  Other Topics Concern  . Not on file  Social History Narrative   Pt gets regular exercise with cardio 2 days a week and 3 classes a week.   Diet consists of fruits and veggies, doesn't like meat much and likes soda and sweet tea.   Review of Systems Did use phentermine in the past--may have helped some then (but may have made her heart irregular but okay on low dose) Sleeps okay    Objective:   Physical Exam  Constitutional: She appears well-developed. No distress.  Respiratory: Effort normal. No respiratory distress.  Psychiatric: She has a normal mood and affect. Her behavior is normal.           Assessment & Plan:

## 2018-09-29 ENCOUNTER — Telehealth: Payer: Self-pay | Admitting: Internal Medicine

## 2018-09-29 NOTE — Telephone Encounter (Signed)
I left a detailed message on patient's voice mail to call back and schedule 3 month f/u with Dr.Letvak.

## 2018-10-22 ENCOUNTER — Other Ambulatory Visit: Payer: Self-pay | Admitting: Internal Medicine

## 2018-11-02 MED ORDER — PHENTERMINE HCL 37.5 MG PO CAPS: 37.5000 mg | ORAL_CAPSULE | ORAL | 5 refills | Status: DC

## 2018-11-18 ENCOUNTER — Other Ambulatory Visit: Payer: Self-pay | Admitting: Otolaryngology

## 2018-11-18 ENCOUNTER — Other Ambulatory Visit: Payer: Self-pay

## 2018-11-18 DIAGNOSIS — R05 Cough: Secondary | ICD-10-CM | POA: Diagnosis not present

## 2018-11-18 DIAGNOSIS — J301 Allergic rhinitis due to pollen: Secondary | ICD-10-CM | POA: Diagnosis not present

## 2018-11-18 DIAGNOSIS — R0981 Nasal congestion: Secondary | ICD-10-CM

## 2018-11-18 DIAGNOSIS — Z20822 Contact with and (suspected) exposure to covid-19: Secondary | ICD-10-CM

## 2018-11-18 DIAGNOSIS — R6889 Other general symptoms and signs: Secondary | ICD-10-CM | POA: Diagnosis not present

## 2018-11-18 DIAGNOSIS — K219 Gastro-esophageal reflux disease without esophagitis: Secondary | ICD-10-CM | POA: Diagnosis not present

## 2018-11-18 NOTE — Progress Notes (Unsigned)
covi

## 2018-11-21 LAB — NOVEL CORONAVIRUS, NAA: SARS-CoV-2, NAA: NOT DETECTED

## 2018-11-26 ENCOUNTER — Other Ambulatory Visit: Payer: Self-pay | Admitting: Internal Medicine

## 2018-11-26 NOTE — Telephone Encounter (Signed)
Please refill x 1 F/u is due  

## 2018-11-26 NOTE — Telephone Encounter (Signed)
Last OV 09/25/17 and has canceled twice with no future appointment scheduled refill or refuse please advise in Dr. Ena Dawley absence

## 2019-01-08 ENCOUNTER — Ambulatory Visit: Payer: BC Managed Care – PPO | Admitting: Internal Medicine

## 2019-01-08 ENCOUNTER — Encounter: Payer: Self-pay | Admitting: Internal Medicine

## 2019-01-08 ENCOUNTER — Other Ambulatory Visit: Payer: Self-pay

## 2019-01-08 VITALS — BP 110/82 | HR 87 | Temp 97.9°F | Ht 65.0 in | Wt 179.0 lb

## 2019-01-08 DIAGNOSIS — E039 Hypothyroidism, unspecified: Secondary | ICD-10-CM | POA: Diagnosis not present

## 2019-01-08 DIAGNOSIS — E038 Other specified hypothyroidism: Secondary | ICD-10-CM

## 2019-01-08 DIAGNOSIS — F39 Unspecified mood [affective] disorder: Secondary | ICD-10-CM | POA: Diagnosis not present

## 2019-01-08 DIAGNOSIS — E669 Obesity, unspecified: Secondary | ICD-10-CM

## 2019-01-08 LAB — CBC
HCT: 39 % (ref 36.0–46.0)
Hemoglobin: 13.6 g/dL (ref 12.0–15.0)
MCHC: 34.9 g/dL (ref 30.0–36.0)
MCV: 93.3 fl (ref 78.0–100.0)
Platelets: 246 10*3/uL (ref 150.0–400.0)
RBC: 4.18 Mil/uL (ref 3.87–5.11)
RDW: 12.5 % (ref 11.5–15.5)
WBC: 4.2 10*3/uL (ref 4.0–10.5)

## 2019-01-08 LAB — COMPREHENSIVE METABOLIC PANEL
ALT: 11 U/L (ref 0–35)
AST: 15 U/L (ref 0–37)
Albumin: 4.2 g/dL (ref 3.5–5.2)
Alkaline Phosphatase: 54 U/L (ref 39–117)
BUN: 12 mg/dL (ref 6–23)
CO2: 28 mEq/L (ref 19–32)
Calcium: 9.1 mg/dL (ref 8.4–10.5)
Chloride: 108 mEq/L (ref 96–112)
Creatinine, Ser: 0.85 mg/dL (ref 0.40–1.20)
GFR: 72.67 mL/min (ref >60.00)
Glucose, Bld: 92 mg/dL (ref 70–99)
Potassium: 4 mEq/L (ref 3.5–5.1)
Sodium: 142 mEq/L (ref 135–145)
Total Bilirubin: 0.8 mg/dL (ref 0.2–1.2)
Total Protein: 6.3 g/dL (ref 6.0–8.3)

## 2019-01-08 LAB — T4, FREE: Free T4: 0.8 ng/dL (ref 0.60–1.60)

## 2019-01-08 LAB — TSH: TSH: 5.08 u[IU]/mL — ABNORMAL HIGH (ref 0.35–4.50)

## 2019-01-08 MED ORDER — LEVOTHYROXINE SODIUM 50 MCG PO TABS: 50.0000 ug | ORAL_TABLET | Freq: Every day | ORAL | 3 refills | Status: DC

## 2019-01-08 NOTE — Assessment & Plan Note (Signed)
Did have some symptoms so on Rx Will recheck

## 2019-01-08 NOTE — Progress Notes (Signed)
Subjective:    Patient ID: Sara Howell, female    DOB: May 22, 1975, 43 y.o.   MRN: [id number]  HPI Here for follow up after starting phentermine for obesity  Has lost 13# or so Phentermine does cause some anxious sense and nausea Does cut her appetite though Her heart rate stays up Not really dieting--due to low appetite  Likes keto type diet for the Hashimoto's--this has helped some Still exercising regularly Continues on virtual work   Current Outpatient Medications on File Prior to Visit  Medication Sig Dispense Refill  . levothyroxine (SYNTHROID) 50 MCG tablet TAKE 1 TABLET BY MOUTH DAILY BEFORE BREAKFAST. NEED APPOINTMENT FOR FURTHER REFILLS. 30 tablet 0  . omeprazole (PRILOSEC) 20 MG capsule Take 1 capsule (20 mg total) by mouth daily. 60 capsule 11  . phentermine 37.5 MG capsule Take 1 capsule (37.5 mg total) by mouth every morning. 30 capsule 5  . Ergocalciferol 2500 units CAPS Take 1 capsule by mouth daily. (Patient not taking: Reported on 01/08/2019) 120 capsule 4   No current facility-administered medications on file prior to visit.     Allergies  Allergen Reactions  . Corn-Containing Products   . Eggs Or Egg-Derived Products     Large amounts causes tightness in chest  . Other     Honey Chest tightness  . Peanut-Containing Drug Products     Past Medical History:  Diagnosis Date  . Anal fissure   . Anemia   . Anxiety   . Depression   . Hashimoto's thyroiditis 03/10/2017  . Hypothyroidism 10/12   post partum only, no further need for meds  . PONV (postoperative nausea and vomiting)   . PVC (premature ventricular contraction)    no meds no cardiologist routinely    Past Surgical History:  Procedure Laterality Date  . CERVICAL DISCECTOMY  2007   No specified location  . GANGLION CYST EXCISION Left 2011  . LAPAROSCOPY  09/12/2011   Procedure: LAPAROSCOPY OPERATIVE;  Surgeon: Marylynn Pearson, MD;  Location: [facility];  Service: Gynecology;  Laterality:  Right;  right oophorectomy  . MUSCLE BIOPSY  as a teenager   non specific; biopsy  . NSVD     x 2  . WISDOM TOOTH EXTRACTION      Family History  Problem Relation Age of Onset  . Hepatitis Father        Hep C  . Hypertension Father   . Drug abuse Father   . Alcohol abuse Father   . Anxiety disorder Father   . Hypothyroidism Sister   . Crohn's disease Brother   . Diabetes Paternal Grandfather   . Leukemia Paternal Grandfather   . Seizures Maternal Aunt        Epilepsy  . Seizures Maternal Uncle        Epilepsy  . Liver cancer Maternal Uncle   . Depression Maternal Grandmother   . Anxiety disorder Maternal Grandmother   . Anxiety disorder Daughter     Social History   Socioeconomic History  . Marital status: Married    Spouse name: jimmy  . Number of children: 3  . Years of education: Not on file  . Highest education level: High school graduate  Occupational History  . Occupation: Tree surgeon    Comment: Inside sales--- [employer]  Social Needs  . Financial resource strain: Not hard at all  . Food insecurity    Worry: Never true    Inability: Never true  . Transportation needs  Medical: No    Non-medical: No  Tobacco Use  . Smoking status: Never Smoker  . Smokeless tobacco: Never Used  Substance and Sexual Activity  . Alcohol use: Not Currently    Comment: Rarely  . Drug use: No  . Sexual activity: Yes    Comment: husband vasectomy  Lifestyle  . Physical activity    Days per week: 0 days    Minutes per session: 0 min  . Stress: Only a little  Relationships  . Social Herbalist on phone: Once a week    Gets together: Never    Attends religious service: Never    Active member of club or organization: No    Attends meetings of clubs or organizations: Never    Relationship status: Married  . Intimate partner violence    Fear of current or ex partner: No    Emotionally abused: No    Physically abused: No    Forced sexual  activity: No  Other Topics Concern  . Not on file  Social History Narrative   Pt gets regular exercise with cardio 2 days a week and 3 classes a week.   Diet consists of fruits and veggies, doesn't like meat much and likes soda and sweet tea.   Review of Systems Phentermine at this dose does affect her sleep some Bowels are slow some days---related to her eating Tries to drink a lot of water Not depressed now    Objective:   Physical Exam  Constitutional: She appears well-developed. No distress.  Neck: No thyromegaly present.  Cardiovascular: Normal rate, regular rhythm and normal heart sounds. Exam reveals no gallop.  No murmur heard. Respiratory: Effort normal and breath sounds normal. No respiratory distress. She has no wheezes. She has no rales.  GI: Soft. There is no abdominal tenderness.  Musculoskeletal:        General: No edema.  Lymphadenopathy:    She has no cervical adenopathy.  Skin:  Small cyst in skin in RUQ abdomen  Psychiatric: She has a normal mood and affect. Her behavior is normal.           Assessment & Plan:

## 2019-01-08 NOTE — Assessment & Plan Note (Signed)
Had post partum depression Then bad spell with work stress No persistent symptoms now

## 2019-01-08 NOTE — Assessment & Plan Note (Signed)
Good response to phentermine--- though some expected side effects Discussed continuing--but considering skipping days or reducing dose after further weight loss

## 2019-02-02 ENCOUNTER — Encounter: Payer: Self-pay | Admitting: Obstetrics and Gynecology

## 2019-02-02 ENCOUNTER — Other Ambulatory Visit: Payer: Self-pay

## 2019-02-02 ENCOUNTER — Ambulatory Visit: Payer: BC Managed Care – PPO | Admitting: Obstetrics and Gynecology

## 2019-02-02 ENCOUNTER — Encounter: Payer: BLUE CROSS/BLUE SHIELD | Admitting: Internal Medicine

## 2019-02-02 VITALS — BP 118/66 | HR 72 | Ht 65.0 in | Wt 181.7 lb

## 2019-02-02 DIAGNOSIS — N3946 Mixed incontinence: Secondary | ICD-10-CM | POA: Diagnosis not present

## 2019-02-02 DIAGNOSIS — Z8719 Personal history of other diseases of the digestive system: Secondary | ICD-10-CM

## 2019-02-02 DIAGNOSIS — N816 Rectocele: Secondary | ICD-10-CM

## 2019-02-02 DIAGNOSIS — N941 Unspecified dyspareunia: Secondary | ICD-10-CM | POA: Diagnosis not present

## 2019-02-02 DIAGNOSIS — N946 Dysmenorrhea, unspecified: Secondary | ICD-10-CM

## 2019-02-02 NOTE — Patient Instructions (Signed)

## 2019-02-02 NOTE — Progress Notes (Signed)
  Subjective:     Patient ID: Sara Howell, female   DOB: [birth year], 43 y.o.   MRN: [id number]  HPI Has had problems with urinary leakage and rectal bleeding in past. Now leaking urine every day (using weighted balls with Kegels) and feels constant vaginal pressure.  Also having constipation and bleeding from rectum and vagina with BMs. Feels vaginal pressure and irritation with this. Has to pit finger in vagina against stool to try to help it. Bulging is quite significant with bowel movements. Does not have active hemorrhoids.  Reports menses have gotten very painful and heavier and BMs are painful during menses. Thinks it may be endometriosis.   Sex is painful- more located on right side. Has to try different positions to get into a comfortable. Using lubrication.   Review of Systems  All other systems reviewed and are negative.      Objective:   Physical Exam A&Ox4 Well groomed female in no distress Vitals with BMI 02/02/2019 01/08/2019 09/28/2018  Height 5\' 5"  5\' 5"  -  Weight 181 lbs 11 oz 179 lbs 192 lbs  BMI [id number] [id number] -  Systolic [id number] (No Data)  Diastolic 66 82 (No Data)  Pulse 72 87 (No Data)  Some encounter information is confidential and restricted. Go to Review Flowsheets activity to see all data.  Pelvic exam: VULVA: normal appearing vulva with no masses, tenderness or lesions, VAGINA: normal appearing vagina with normal color and discharge, no lesions, PELVIC FLOOR EXAM: rectocele noted on left midline- no change in muscle integrity with kegel, bulges out of vagina when bearing down., CERVIX: normal appearing cervix without discharge or lesions, cervical motion tenderness absent, multiparous os, UTERUS: uterus is normal size, shape, consistency and nontender, fixed, ADNEXA: normal adnexa in size, nontender and no masses, RECTAL: normal rectal, no masses, reduced sphincter tone, rectocele noted as stated above.    Assessment:      Rectocele Dysmenorrhea dysparenia Mixed urinary incontenance Pelvic pain    Plan:     Discussed findings and treatment options. Discussed surgical correction of rectocele and she is interested in seeking surgery. Will do ultrasound in 2-3 weeks and then follow up with Dr Cherry(patient requested).   Discussed pelvic floor PT also, but will wait until after surgery to pursue.    ,CNM

## 2019-02-24 ENCOUNTER — Ambulatory Visit (INDEPENDENT_AMBULATORY_CARE_PROVIDER_SITE_OTHER): Payer: BC Managed Care – PPO

## 2019-02-24 ENCOUNTER — Other Ambulatory Visit: Payer: Self-pay

## 2019-02-24 ENCOUNTER — Ambulatory Visit: Payer: BC Managed Care – PPO | Admitting: Obstetrics and Gynecology

## 2019-02-24 ENCOUNTER — Encounter: Payer: Self-pay | Admitting: Obstetrics and Gynecology

## 2019-02-24 VITALS — BP 139/85 | HR 61 | Ht 65.0 in | Wt 179.5 lb

## 2019-02-24 DIAGNOSIS — N816 Rectocele: Secondary | ICD-10-CM | POA: Diagnosis not present

## 2019-02-24 DIAGNOSIS — N946 Dysmenorrhea, unspecified: Secondary | ICD-10-CM | POA: Diagnosis not present

## 2019-02-24 DIAGNOSIS — N920 Excessive and frequent menstruation with regular cycle: Secondary | ICD-10-CM

## 2019-02-24 DIAGNOSIS — N941 Unspecified dyspareunia: Secondary | ICD-10-CM

## 2019-02-24 DIAGNOSIS — N3946 Mixed incontinence: Secondary | ICD-10-CM | POA: Diagnosis not present

## 2019-02-24 NOTE — Progress Notes (Signed)
Pt is present for surgery consults and u/s review. Pt stated that her cycles were heavy, she has cramping and clots.

## 2019-02-24 NOTE — Progress Notes (Addendum)
GYNECOLOGY PROGRESS NOTE  Subjective:    Patient ID: Sara Howell, female    DOB: 09-11-1975, 43 y.o.   MRN: [id number]  HPI  Patient is a 43 y.o. G29P3003 female who presents for for surgical consultation for rectocele.  She was previously seen by Sara Howell, CNM who noted evidence of a rectocele.  Per patient over the last several weeks she has been having issues with her bowel movements (thinks this may be due to her thyroid disease and weight loss medications that she recently started).  She is having some mild constipation and noting vaginal pressure and irritation.  Has to splint in order to have a good bowel movement.  Reports that the vaginal bulge is quite significant when she does try to have a bowel movement.  Denies history of hemorrhoids.  Additionally, patient reports issues with urinary leakage.  Reports that she had typically leaks urine every day.  Has to wear panty liners or a urinary pad.  Has tried using weighted balls with Kegels but has not noticed any significant change.  Patient reports leakage is worse with coughing sneezing, laughing and dancing.  Does occasionally note some components of urgency as well.  Lastly, patient notes that her menstrual cycles have become more painful and heavier.  Notes that she has a family history of endometriosis.  Intercourse has also become more painful.  Is using lubricants and trying different positions to help.  Had ultrasound today, presenting to also discuss results.    The following portions of the patient's history were reviewed and updated as appropriate:  She  has a past medical history of Anal fissure, Anemia, Anxiety, Depression, Hashimoto's thyroiditis (03/10/2017), Hypothyroidism (10/12), PONV (postoperative nausea and vomiting), and PVC (premature ventricular contraction).   She  has a past surgical history that includes Cervical discectomy (2007); Muscle biopsy (as a teenager); NSVD; Ganglion cyst excision (Left,  2011); Wisdom tooth extraction; and laparoscopy (09/12/2011).   Her family history includes Alcohol abuse in her father; Anxiety disorder in her daughter, father, and maternal grandmother; Crohn's disease in her brother; Depression in her maternal grandmother; Diabetes in her paternal grandfather; Drug abuse in her father; Hepatitis in her father; Hypertension in her father; Hypothyroidism in her sister; Leukemia in her paternal grandfather; Liver cancer in her maternal uncle; Seizures in her maternal aunt and maternal uncle.   Current Outpatient Medications on File Prior to Visit  Medication Sig Dispense Refill  . levothyroxine (SYNTHROID) 50 MCG tablet Take 1 tablet (50 mcg total) by mouth daily. 90 tablet 3  . omeprazole (PRILOSEC) 20 MG capsule Take 1 capsule (20 mg total) by mouth daily. 60 capsule 11  . phentermine 37.5 MG capsule Take 1 capsule (37.5 mg total) by mouth every morning. 30 capsule 5  . Ergocalciferol 2500 units CAPS Take 1 capsule by mouth daily. (Patient not taking: Reported on 01/08/2019) 120 capsule 4   No current facility-administered medications on file prior to visit.    She is allergic to corn-containing products; eggs or egg-derived products; other; and peanut-containing drug products..   Review of Systems Pertinent items noted in HPI and remainder of comprehensive ROS otherwise negative.   Objective:   Blood pressure 139/85, pulse 61, height 5\' 5"  (1.651 m), weight 179 lb 8 oz (81.4 kg), last menstrual period 02/05/2019. General appearance: alert and no distress Abdomen: soft, non-tender; bowel sounds normal; no masses,  no organomegaly Pelvic: external genitalia normal, rectovaginal septum normal.  Moderate rectocele present. Fair  vaginal wall muscle tone. Vagina without discharge.  Cervix normal appearing, no lesions and no motion tenderness.  Uterus mobile, nontender, normal shape and size.  Right adnexa surgically absent, Left adnexa non-palpable, nontender  bilaterally.  Extremities: extremities normal, atraumatic, no cyanosis or edema Neurologic: Grossly normal    Labs:  Lab Results  Component Value Date   WBC 4.2 01/08/2019   HGB 13.6 01/08/2019   HCT 39.0 01/08/2019   MCV 93.3 01/08/2019   PLT 246.0 01/08/2019    Lab Results  Component Value Date   TSH 5.08 (H) 01/08/2019     Imaging:  US PELVIS (TRANSABDOMINAL ONLY) Patient Name: Sara Howell DOB: [birth year] MRN: [id number] ULTRASOUND REPORT  Location: [facility] Date of Service: 02/24/2019   Indications:Pelvic Pain Findings:  The uterus is retroverted and measures 9.0 x 4.6 x 4.8 cm. Echo texture is homogenous without evidence of focal masses.  The Endometrium measures 11.7 mm.  Right Ovary was removed x 7 years ago patient states. measures  Left Ovary measures 3.8 x 3.3 x 3.43 cm. It is normal in appearance. Survey of the adnexa demonstrates no adnexal masses. There is  free fluid in the cul de sac.  Impression: 1. Small amount of free fluid in the cul-de- sac. 2. Endometrium is upper limits ( patient mid cycle )  Recommendations: 1.Clinical correlation with the patient's History and Physical Exam.  Sara Howell    RDMS  I have reviewed this study and agree with documented findings.   Sara Maid, MD [facility]    Assessment:   1. Rectocele   2. Mixed stress and urge urinary incontinence   3. Dysmenorrhea   4. Dyspareunia, female   5. Menorrhagia with regular cycle     Plan:   1. Rectocele -discussion had with patient regarding management options for rectocele.  Can opt for conservative management with pelvic floor physical therapy, pessary use, or can opt for surgical repair.  Patient thinks that she would like to have surgical repair.  Discussed nature of the procedure, risks and benefits. 2.  Mixed stress and urge urinary incontinence -discussed management options for incontinence, including again conservative  management with pelvic floor physical therapy with or without pessary use, for surgical repair with urethral sling.  Discussed that as urge component is less bothersome, can work on bladder training, decreasing bladder irritants, and possible use of medications.  Patient notes that she would like to think over these options however if she will be having surgery for the rectocele will likely consider surgical repair with sling. 3.  Dysmenorrhea and dyspareunia with heavy menses -discussed management options including hormonal treatments (i.e. birth control such as OCPs, NuvaRing, IUD), other hormonal suppression with Freida Busman, or surgical intervention with endometrial ablation or hysterectomy.  Discussed risks and benefits of all treatments.  Patient would like to think over her options, but may consider endometrial ablation in light of having other surgical procedures performed. Reviewed ultrasound results with patient, no obvious structural pathology present. TSH mildly elevated, can sometimes cause abnormalities of the cycle. Patient is being followed by PCP.   Information given on all.  Patient to think over these options.  If she does decide to proceed with surgical management, will schedule.   A total of 30 minutes were spent face-to-face with the patient during this encounter and over half of that time dealt with counseling and coordination of care.   Sara Maid, MD [facility]

## 2019-02-25 ENCOUNTER — Encounter: Payer: Self-pay | Admitting: Obstetrics and Gynecology

## 2019-03-05 ENCOUNTER — Encounter: Payer: BLUE CROSS/BLUE SHIELD | Admitting: Obstetrics and Gynecology

## 2019-04-08 ENCOUNTER — Encounter: Payer: BC Managed Care – PPO | Admitting: Obstetrics and Gynecology

## 2019-04-27 ENCOUNTER — Ambulatory Visit: Payer: BC Managed Care – PPO | Attending: Internal Medicine

## 2019-04-27 DIAGNOSIS — Z20822 Contact with and (suspected) exposure to covid-19: Secondary | ICD-10-CM

## 2019-04-27 DIAGNOSIS — Z20828 Contact with and (suspected) exposure to other viral communicable diseases: Secondary | ICD-10-CM | POA: Diagnosis not present

## 2019-04-28 LAB — NOVEL CORONAVIRUS, NAA: SARS-CoV-2, NAA: NOT DETECTED

## 2019-05-12 DIAGNOSIS — L718 Other rosacea: Secondary | ICD-10-CM | POA: Diagnosis not present

## 2019-05-12 DIAGNOSIS — L738 Other specified follicular disorders: Secondary | ICD-10-CM | POA: Diagnosis not present

## 2019-05-19 ENCOUNTER — Ambulatory Visit: Payer: BC Managed Care – PPO | Attending: Internal Medicine

## 2019-05-19 DIAGNOSIS — Z20822 Contact with and (suspected) exposure to covid-19: Secondary | ICD-10-CM

## 2019-05-20 LAB — NOVEL CORONAVIRUS, NAA: SARS-CoV-2, NAA: NOT DETECTED

## 2019-05-24 ENCOUNTER — Other Ambulatory Visit: Payer: Self-pay | Admitting: Internal Medicine

## 2019-09-15 ENCOUNTER — Encounter: Payer: Self-pay | Admitting: Internal Medicine

## 2019-10-01 ENCOUNTER — Ambulatory Visit: Payer: BC Managed Care – PPO | Admitting: Internal Medicine

## 2019-10-01 ENCOUNTER — Other Ambulatory Visit: Payer: Self-pay

## 2019-10-01 ENCOUNTER — Encounter: Payer: Self-pay | Admitting: Internal Medicine

## 2019-10-01 VITALS — BP 108/80 | HR 68 | Temp 98.1°F | Ht 65.0 in | Wt 187.0 lb

## 2019-10-01 DIAGNOSIS — R5383 Other fatigue: Secondary | ICD-10-CM

## 2019-10-01 DIAGNOSIS — E038 Other specified hypothyroidism: Secondary | ICD-10-CM

## 2019-10-01 DIAGNOSIS — E063 Autoimmune thyroiditis: Secondary | ICD-10-CM

## 2019-10-01 DIAGNOSIS — E669 Obesity, unspecified: Secondary | ICD-10-CM | POA: Diagnosis not present

## 2019-10-01 LAB — COMPREHENSIVE METABOLIC PANEL
ALT: 26 U/L (ref 0–35)
AST: 25 U/L (ref 0–37)
Albumin: 4.1 g/dL (ref 3.5–5.2)
Alkaline Phosphatase: 57 U/L (ref 39–117)
BUN: 11 mg/dL (ref 6–23)
CO2: 27 mEq/L (ref 19–32)
Calcium: 8.8 mg/dL (ref 8.4–10.5)
Chloride: 109 mEq/L (ref 96–112)
Creatinine, Ser: 0.74 mg/dL (ref 0.40–1.20)
GFR: 84.99 mL/min (ref >60.00)
Glucose, Bld: 91 mg/dL (ref 70–99)
Potassium: 4.3 mEq/L (ref 3.5–5.1)
Sodium: 140 mEq/L (ref 135–145)
Total Bilirubin: 0.7 mg/dL (ref 0.2–1.2)
Total Protein: 6.1 g/dL (ref 6.0–8.3)

## 2019-10-01 LAB — VITAMIN D 25 HYDROXY (VIT D DEFICIENCY, FRACTURES): VITD: 19.88 ng/mL — ABNORMAL LOW (ref 30.00–100.00)

## 2019-10-01 LAB — CBC
HCT: 38.5 % (ref 36.0–46.0)
Hemoglobin: 13.2 g/dL (ref 12.0–15.0)
MCHC: 34.3 g/dL (ref 30.0–36.0)
MCV: 94.2 fl (ref 78.0–100.0)
Platelets: 211 K/uL (ref 150.0–400.0)
RBC: 4.09 Mil/uL (ref 3.87–5.11)
RDW: 12.6 % (ref 11.5–15.5)
WBC: 4.3 K/uL (ref 4.0–10.5)

## 2019-10-01 LAB — SEDIMENTATION RATE: Sed Rate: 6 mm/hr (ref 0–20)

## 2019-10-01 LAB — TSH: TSH: 4.64 u[IU]/mL — ABNORMAL HIGH (ref 0.35–4.50)

## 2019-10-01 LAB — T4, FREE: Free T4: 0.71 ng/dL (ref 0.60–1.60)

## 2019-10-01 NOTE — Assessment & Plan Note (Signed)
If TSH even mildly elevated, will increase the levothyroxine

## 2019-10-01 NOTE — Assessment & Plan Note (Signed)
Not excited about phentermine If not satisfied with current regimen, we can consider saxenda

## 2019-10-01 NOTE — Progress Notes (Signed)
Subjective:    Patient ID: Sara Howell, female    DOB: 07/18/1975, 44 y.o.   MRN: [id number]  HPI Here due to fatigue This visit occurred during the SARS-CoV-2 public health emergency.  Safety protocols were in place, including screening questions prior to the visit, additional usage of staff PPE, and extensive cleaning of exam room while observing appropriate contact time as indicated for disinfecting solutions.   Frustrated by her weight going back up Doing boot camp- 1hr three days a week (and gym other days) Now on Weight Watchers Ran out of phentermine--but effect seemed to wane  Concerned about her vitamin D levels Having "shooting pains in my bones again" Doesn't spend much time outside  Continues to work from home  Current Outpatient Medications on File Prior to Visit  Medication Sig Dispense Refill  . levothyroxine (SYNTHROID) 50 MCG tablet Take 1 tablet (50 mcg total) by mouth daily. 90 tablet 3  . omeprazole (PRILOSEC) 20 MG capsule TAKE 1 CAPSULE BY MOUTH EVERY DAY 30 capsule 5   No current facility-administered medications on file prior to visit.    Allergies  Allergen Reactions  . Corn-Containing Products   . Eggs Or Egg-Derived Products     Large amounts causes tightness in chest  . Other     Honey Chest tightness  . Peanut-Containing Drug Products     Past Medical History:  Diagnosis Date  . Anal fissure   . Anemia   . Anxiety   . Depression   . Hashimoto's thyroiditis 03/10/2017  . Hypothyroidism 10/12   post partum only, no further need for meds  . PONV (postoperative nausea and vomiting)   . PVC (premature ventricular contraction)    no meds no cardiologist routinely    Past Surgical History:  Procedure Laterality Date  . CERVICAL DISCECTOMY  2007   No specified location  . GANGLION CYST EXCISION Left 2011  . LAPAROSCOPY  09/12/2011   Procedure: LAPAROSCOPY OPERATIVE;  Surgeon: Marylynn Pearson, MD;  Location: [facility];  Service:  Gynecology;  Laterality: Right;  right oophorectomy  . MUSCLE BIOPSY  as a teenager   non specific; biopsy  . NSVD     x 2  . WISDOM TOOTH EXTRACTION      Family History  Problem Relation Age of Onset  . Hepatitis Father        Hep C  . Hypertension Father   . Drug abuse Father   . Alcohol abuse Father   . Anxiety disorder Father   . Hypothyroidism Sister   . Crohn's disease Brother   . Diabetes Paternal Grandfather   . Leukemia Paternal Grandfather   . Seizures Maternal Aunt        Epilepsy  . Seizures Maternal Uncle        Epilepsy  . Liver cancer Maternal Uncle   . Depression Maternal Grandmother   . Anxiety disorder Maternal Grandmother   . Anxiety disorder Daughter     Social History   Socioeconomic History  . Marital status: Married    Spouse name: jimmy  . Number of children: 3  . Years of education: Not on file  . Highest education level: High school graduate  Occupational History  . Occupation: Tree surgeon    Comment: Inside sales--- [employer]  Tobacco Use  . Smoking status: Never Smoker  . Smokeless tobacco: Never Used  Substance and Sexual Activity  . Alcohol use: Not Currently    Comment: Rarely  .  Drug use: No  . Sexual activity: Yes    Comment: husband vasectomy  Other Topics Concern  . Not on file  Social History Narrative   Pt gets regular exercise with cardio 2 days a week and 3 classes a week.   Diet consists of fruits and veggies, doesn't like meat much and likes soda and sweet tea.   Social Determinants of Health   Financial Resource Strain:   . Difficulty of Paying Living Expenses:   Food Insecurity:   . Worried About Charity fundraiser in the Last Year:   . Arboriculturist in the Last Year:   Transportation Needs:   . Film/video editor (Medical):   Marland Kitchen Lack of Transportation (Non-Medical):   Physical Activity:   . Days of Exercise per Week:   . Minutes of Exercise per Session:   Stress:   . Feeling of Stress :     Social Connections:   . Frequency of Communication with Friends and Family:   . Frequency of Social Gatherings with Friends and Family:   . Attends Religious Services:   . Active Member of Clubs or Organizations:   . Attends Archivist Meetings:   Marland Kitchen Marital Status:   Intimate Partner Violence:   . Fear of Current or Ex-Partner:   . Emotionally Abused:   Marland Kitchen Physically Abused:   . Sexually Abused:    Review of Systems No chest pain  Some hoarseness from allergies No sig SOB Bowels variable--better off the phentermine GERD acts up at times--uses tums prn in addition to PPI. No dysphagia    Objective:   Physical Exam  Constitutional: No distress.  Neck: No thyromegaly present.  Cardiovascular: Normal rate, regular rhythm, normal heart sounds and intact distal pulses. Exam reveals no gallop.  No murmur heard. Respiratory: Effort normal and breath sounds normal. No respiratory distress. She has no wheezes. She has no rales.  GI: Soft. There is no abdominal tenderness.  Musculoskeletal:        General: No edema.  Lymphadenopathy:    She has no cervical adenopathy.  Skin: No rash noted.  Psychiatric: She has a normal mood and affect. Her behavior is normal.           Assessment & Plan:

## 2019-10-01 NOTE — Assessment & Plan Note (Signed)
Unclear etiology and vague symptoms Will check labs No problems with active exercise program

## 2019-10-02 ENCOUNTER — Other Ambulatory Visit: Payer: Self-pay | Admitting: Internal Medicine

## 2019-10-02 MED ORDER — LEVOTHYROXINE SODIUM 75 MCG PO TABS: 75.0000 ug | ORAL_TABLET | Freq: Every day | ORAL | 3 refills | Status: DC

## 2019-10-05 MED ORDER — SAXENDA 18 MG/3ML ~~LOC~~ SOPN: 1.8000 mg | PEN_INJECTOR | Freq: Every day | SUBCUTANEOUS | 5 refills | Status: DC

## 2019-10-13 MED ORDER — PEN NEEDLES 30G X 8 MM MISC: 3 refills | Status: DC

## 2019-10-29 ENCOUNTER — Encounter: Payer: Self-pay | Admitting: Internal Medicine

## 2019-10-29 ENCOUNTER — Ambulatory Visit (INDEPENDENT_AMBULATORY_CARE_PROVIDER_SITE_OTHER): Payer: BC Managed Care – PPO | Admitting: Internal Medicine

## 2019-10-29 ENCOUNTER — Other Ambulatory Visit: Payer: Self-pay

## 2019-10-29 DIAGNOSIS — E669 Obesity, unspecified: Secondary | ICD-10-CM | POA: Diagnosis not present

## 2019-10-29 DIAGNOSIS — Z0000 Encounter for general adult medical examination without abnormal findings: Secondary | ICD-10-CM

## 2019-10-29 DIAGNOSIS — E038 Other specified hypothyroidism: Secondary | ICD-10-CM | POA: Diagnosis not present

## 2019-10-29 DIAGNOSIS — E063 Autoimmune thyroiditis: Secondary | ICD-10-CM

## 2019-10-29 DIAGNOSIS — K219 Gastro-esophageal reflux disease without esophagitis: Secondary | ICD-10-CM

## 2019-10-29 NOTE — Assessment & Plan Note (Signed)
Good start on the liraglutide May want to go very slow on titration

## 2019-10-29 NOTE — Assessment & Plan Note (Signed)
Mild breakthrough on the PPI

## 2019-10-29 NOTE — Assessment & Plan Note (Signed)
Healthy Working on fitness Colon cancer screening starting next year Will schedule with gyn Recommended COVID vaccine Doesn't take flu vaccines due to egg allergy
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
## 2019-10-29 NOTE — Assessment & Plan Note (Signed)
On levothyroxine  ?

## 2019-11-30 DIAGNOSIS — Z03818 Encounter for observation for suspected exposure to other biological agents ruled out: Secondary | ICD-10-CM | POA: Diagnosis not present

## 2019-11-30 DIAGNOSIS — Z20822 Contact with and (suspected) exposure to covid-19: Secondary | ICD-10-CM | POA: Diagnosis not present

## 2019-12-29 ENCOUNTER — Encounter: Payer: Self-pay | Admitting: Certified Nurse Midwife

## 2019-12-29 ENCOUNTER — Other Ambulatory Visit (HOSPITAL_COMMUNITY)
Admission: RE | Admit: 2019-12-29 | Discharge: 2019-12-29 | Disposition: A | Discharge status: Home / Self Care | Payer: BC Managed Care – PPO | Source: Ambulatory Visit | Attending: Certified Nurse Midwife | Admitting: Certified Nurse Midwife

## 2019-12-29 ENCOUNTER — Ambulatory Visit: Payer: BC Managed Care – PPO | Admitting: Certified Nurse Midwife

## 2019-12-29 ENCOUNTER — Other Ambulatory Visit: Payer: Self-pay

## 2019-12-29 VITALS — BP 136/85 | HR 72 | Ht 64.0 in | Wt 179.5 lb

## 2019-12-29 DIAGNOSIS — N898 Other specified noninflammatory disorders of vagina: Secondary | ICD-10-CM

## 2019-12-29 DIAGNOSIS — N949 Unspecified condition associated with female genital organs and menstrual cycle: Secondary | ICD-10-CM

## 2019-12-29 MED ORDER — FLUCONAZOLE 150 MG PO TABS
150.0000 mg | ORAL_TABLET | Freq: Once | ORAL | 0 refills | Status: AC
Start: 2019-12-29 — End: 2019-12-29

## 2019-12-29 NOTE — Patient Instructions (Signed)
Vaginitis Vaginitis is a condition in which the vaginal tissue swells and becomes red (inflamed). This condition is most often caused by a change in the normal balance of bacteria and yeast that live in the vagina. This change causes an overgrowth of certain bacteria or yeast, which causes the inflammation. There are different types of vaginitis, but the most common types are:  Bacterial vaginosis.  Yeast infection (candidiasis).  Trichomoniasis vaginitis. This is a sexually transmitted disease (STD).  Viral vaginitis.  Atrophic vaginitis.  Allergic vaginitis. What are the causes? The cause of this condition depends on the type of vaginitis. It can be caused by:  Bacteria (bacterial vaginosis).  Yeast, which is a fungus (yeast infection).  A parasite (trichomoniasis vaginitis).  A virus (viral vaginitis).  Low hormone levels (atrophic vaginitis). Low hormone levels can occur during pregnancy, breastfeeding, or after menopause.  Irritants, such as bubble baths, scented tampons, and feminine sprays (allergic vaginitis). Other factors can change the normal balance of the yeast and bacteria that live in the vagina. These include:  Antibiotic medicines.  Poor hygiene.  Diaphragms, vaginal sponges, spermicides, birth control pills, and intrauterine devices (IUD).  Sex.  Infection.  Uncontrolled diabetes.  A weakened defense (immune) system. What increases the risk? This condition is more likely to develop in women who:  Smoke.  Use vaginal douches, scented tampons, or scented sanitary pads.  Wear tight-fitting pants.  Wear thong underwear.  Use oral birth control pills or an IUD.  Have sex without a condom.  Have multiple sex partners.  Have an STD.  Frequently use the spermicide nonoxynol-9.  Eat lots of foods high in sugar.  Have uncontrolled diabetes.  Have low estrogen levels.  Have a weakened immune system from an immune disorder or medical  treatment.  Are pregnant or breastfeeding. What are the signs or symptoms? Symptoms vary depending on the cause of the vaginitis. Common symptoms include:  Abnormal vaginal discharge. ? The discharge is white, gray, or yellow with bacterial vaginosis. ? The discharge is thick, white, and cheesy with a yeast infection. ? The discharge is frothy and yellow or greenish with trichomoniasis.  A bad vaginal smell. The smell is fishy with bacterial vaginosis.  Vaginal itching, pain, or swelling.  Sex that is painful.  Pain or burning when urinating. Sometimes there are no symptoms. How is this diagnosed? This condition is diagnosed based on your symptoms and medical history. A physical exam, including a pelvic exam, will also be done. You may also have other tests, including:  Tests to determine the pH level (acidity or alkalinity) of your vagina.  A whiff test, to assess the odor that results when a sample of your vaginal discharge is mixed with a potassium hydroxide solution.  Tests of vaginal fluid. A sample will be examined under a microscope. How is this treated? Treatment varies depending on the type of vaginitis you have. Your treatment may include:  Antibiotic creams or pills to treat bacterial vaginosis and trichomoniasis.  Antifungal medicines, such as vaginal creams or suppositories, to treat a yeast infection.  Medicine to ease discomfort if you have viral vaginitis. Your sexual partner should also be treated.  Estrogen delivered in a cream, pill, suppository, or vaginal ring to treat atrophic vaginitis. If vaginal dryness occurs, lubricants and moisturizing creams may help. You may need to avoid scented soaps, sprays, or douches.  Stopping use of a product that is causing allergic vaginitis. Then using a vaginal cream to treat the symptoms. Follow   these instructions at home: Lifestyle  Keep your genital area clean and dry. Avoid soap, and only rinse the area with  water.  Do not douche or use tampons until your health care provider says it is okay to do so. Use sanitary pads, if needed.  Do not have sex until your health care provider approves. When you can return to sex, practice safe sex and use condoms.  Wipe from front to back. This avoids the spread of bacteria from the rectum to the vagina. General instructions  Take over-the-counter and prescription medicines only as told by your health care provider.  If you were prescribed an antibiotic medicine, take or use it as told by your health care provider. Do not stop taking or using the antibiotic even if you start to feel better.  Keep all follow-up visits as told by your health care provider. This is important. How is this prevented?  Use mild, non-scented products. Do not use things that can irritate the vagina, such as fabric softeners. Avoid the following products if they are scented: ? Feminine sprays. ? Detergents. ? Tampons. ? Feminine hygiene products. ? Soaps or bubble baths.  Let air reach your genital area. ? Wear cotton underwear to reduce moisture buildup. ? Avoid wearing underwear while you sleep. ? Avoid wearing tight pants and underwear or nylons without a cotton panel. ? Avoid wearing thong underwear.  Take off any wet clothing, such as bathing suits, as soon as possible.  Practice safe sex and use condoms. Contact a health care provider if:  You have abdominal pain.  You have a fever.  You have symptoms that last for more than 2-3 days. Get help right away if:  You have a fever and your symptoms suddenly get worse. Summary  Vaginitis is a condition in which the vaginal tissue becomes inflamed.This condition is most often caused by a change in the normal balance of bacteria and yeast that live in the vagina.  Treatment varies depending on the type of vaginitis you have.  Do not douche, use tampons , or have sex until your health care provider approves. When  you can return to sex, practice safe sex and use condoms. This information is not intended to replace advice given to you by your health care provider. Make sure you discuss any questions you have with your health care provider. Document Revised: 03/28/2017 Document Reviewed: 05/21/2016 Elsevier Patient Education  2020 [place]. Vaginal Yeast Infection, Adult  Vaginal yeast infection is a condition that causes vaginal discharge as well as soreness, swelling, and redness (inflammation) of the vagina. This is a common condition. Some women get this infection frequently. What are the causes? This condition is caused by a change in the normal balance of the yeast (candida) and bacteria that live in the vagina. This change causes an overgrowth of yeast, which causes the inflammation. What increases the risk? The condition is more likely to develop in women who:  Take antibiotic medicines.  Have diabetes.  Take birth control pills.  Are pregnant.  Douche often.  Have a weak body defense system (immune system).  Have been taking steroid medicines for a long time.  Frequently wear tight clothing. What are the signs or symptoms? Symptoms of this condition include:  White, thick, creamy vaginal discharge.  Swelling, itching, redness, and irritation of the vagina. The lips of the vagina (vulva) may be affected as well.  Pain or a burning feeling while urinating.  Pain during sex. How is  this diagnosed? This condition is diagnosed based on:  Your medical history.  A physical exam.  A pelvic exam. Your health care provider will examine a sample of your vaginal discharge under a microscope. Your health care provider may send this sample for testing to confirm the diagnosis. How is this treated? This condition is treated with medicine. Medicines may be over-the-counter or prescription. You may be told to use one or more of the following:  Medicine that is taken by mouth  (orally).  Medicine that is applied as a cream (topically).  Medicine that is inserted directly into the vagina (suppository). Follow these instructions at home:  Lifestyle  Do not have sex until your health care provider approves. Tell your sex partner that you have a yeast infection. That person should go to his or her health care provider and ask if they should also be treated.  Do not wear tight clothes, such as pantyhose or tight pants.  Wear breathable cotton underwear. General instructions  Take or apply over-the-counter and prescription medicines only as told by your health care provider.  Eat more yogurt. This may help to keep your yeast infection from returning.  Do not use tampons until your health care provider approves.  Try taking a sitz bath to help with discomfort. This is a warm water bath that is taken while you are sitting down. The water should only come up to your hips and should cover your buttocks. Do this 3-4 times per day or as told by your health care provider.  Do not douche.  If you have diabetes, keep your blood sugar levels under control.  Keep all follow-up visits as told by your health care provider. This is important. Contact a health care provider if:  You have a fever.  Your symptoms go away and then return.  Your symptoms do not get better with treatment.  Your symptoms get worse.  You have new symptoms.  You develop blisters in or around your vagina.  You have blood coming from your vagina and it is not your menstrual period.  You develop pain in your abdomen. Summary  Vaginal yeast infection is a condition that causes discharge as well as soreness, swelling, and redness (inflammation) of the vagina.  This condition is treated with medicine. Medicines may be over-the-counter or prescription.  Take or apply over-the-counter and prescription medicines only as told by your health care provider.  Do not douche. Do not have sex or  use tampons until your health care provider approves.  Contact a health care provider if your symptoms do not get better with treatment or your symptoms go away and then return. This information is not intended to replace advice given to you by your health care provider. Make sure you discuss any questions you have with your health care provider. Document Revised: 11/13/2018 Document Reviewed: 09/01/2017 Elsevier Patient Education  [place].

## 2019-12-29 NOTE — Progress Notes (Signed)
GYN ENCOUNTER NOTE  Subjective:       Sara Howell is a 44 y.o. G55P3003 female is here for gynecologic evaluation of the following issues:  1. increasaed white clumpy vaginal discharge with burning and itching. Started over the weekend. She state she has abrasions on her labia from scratching. She has tried neosporin externally. [place] like [place] may have [place] infection. She states she has been very sensitive with PH balance since having ovary removed.    Gynecologic History Patient's last menstrual period was 12/13/2019 (exact date). Contraception: none Last Pap: 09/26/2015. Results were: normal Last mammogram: 09/2015. Normal   Obstetric History OB History  Gravida Para Term Preterm AB Living  3 3 3     3   SAB TAB Ectopic Multiple Live Births          3    # Outcome Date GA Lbr Len/2nd Weight Sex Delivery Anes PTL Lv  3 Term 06/27/09   8 lb 6 oz (3.799 kg) M Vag-Spont  N LIV  2 Term 10/03/00   9 lb 6 oz (4.252 kg) F Vag-Spont  N LIV  1 Term 06/19/94   8 lb 3 oz (3.714 kg) F Vag-Spont   LIV    Past Medical History:  Diagnosis Date  . Anal fissure   . Anemia   . Anxiety   . Depression   . Hashimoto's thyroiditis 03/10/2017  . Hypothyroidism 10/12   post partum only, no further need for meds  . PONV (postoperative nausea and vomiting)   . PVC (premature ventricular contraction)    no meds no cardiologist routinely    Past Surgical History:  Procedure Laterality Date  . CERVICAL DISCECTOMY  2007   No specified location  . GANGLION CYST EXCISION Left 2011  . LAPAROSCOPY  09/12/2011   Procedure: LAPAROSCOPY OPERATIVE;  Surgeon: Marylynn Pearson, MD;  Location: [facility];  Service: Gynecology;  Laterality: Right;  right oophorectomy  . MUSCLE BIOPSY  as a teenager   non specific; biopsy  . NSVD     x 2  . WISDOM TOOTH EXTRACTION      Current Outpatient Medications on File Prior to Visit  Medication Sig Dispense Refill  . Insulin Pen  Needle (PEN NEEDLES) 30G X 8 MM MISC Use to inject Saxenda once daily. 100 each 3  . levothyroxine (SYNTHROID) 75 MCG tablet Take 1 tablet (75 mcg total) by mouth daily. 90 tablet 3  . Liraglutide -Weight Management (SAXENDA) 18 MG/3ML SOPN Inject 0.3 mLs (1.8 mg total) into the skin daily. 3 pen 5  . omeprazole (PRILOSEC) 20 MG capsule TAKE 1 CAPSULE BY MOUTH EVERY DAY 30 capsule 5   No current facility-administered medications on file prior to visit.    Allergies  Allergen Reactions  . Corn-Containing Products   . Eggs Or Egg-Derived Products     Large amounts causes tightness in chest  . Other     Honey Chest tightness  . Peanut-Containing Drug Products     Social History   Socioeconomic History  . Marital status: Married    Spouse name: jimmy  . Number of children: 3  . Years of education: Not on file  . Highest education level: High school graduate  Occupational History  . Occupation: Tree surgeon    Comment: [employer]  Tobacco Use  . Smoking status: Never Smoker  . Smokeless tobacco: Never Used  Vaping Use  . Vaping Use: Never  used  Substance and Sexual Activity  . Alcohol use: Not Currently    Comment: Rarely  . Drug use: No  . Sexual activity: Yes    Comment: husband vasectomy  Other Topics Concern  . Not on file  Social History Narrative   Pt gets regular exercise with cardio 2 days a week and 3 classes a week.   Diet consists of fruits and veggies, doesn't like meat much and likes soda and sweet tea.   Social Determinants of Health   Financial Resource Strain:   . Difficulty of Paying Living Expenses: Not on file  Food Insecurity:   . Worried About Charity fundraiser in the Last Year: Not on file  . Ran Out of Food in the Last Year: Not on file  Transportation Needs:   . Lack of Transportation (Medical): Not on file  . Lack of Transportation (Non-Medical): Not on file  Physical Activity:   . Days of Exercise per Week: Not on file  .  Minutes of Exercise per Session: Not on file  Stress:   . Feeling of Stress : Not on file  Social Connections:   . Frequency of Communication with Friends and Family: Not on file  . Frequency of Social Gatherings with Friends and Family: Not on file  . Attends Religious Services: Not on file  . Active Member of Clubs or Organizations: Not on file  . Attends Archivist Meetings: Not on file  . Marital Status: Not on file  Intimate Partner Violence:   . Fear of Current or Ex-Partner: Not on file  . Emotionally Abused: Not on file  . Physically Abused: Not on file  . Sexually Abused: Not on file    Family History  Problem Relation Age of Onset  . Hepatitis Father        Hep C  . Hypertension Father   . Drug abuse Father   . Alcohol abuse Father   . Anxiety disorder Father   . Hypothyroidism Sister   . Crohn's disease Brother   . Diabetes Paternal Grandfather   . Leukemia Paternal Grandfather   . Seizures Maternal Aunt        Epilepsy  . Seizures Maternal Uncle        Epilepsy  . Liver cancer Maternal Uncle   . Depression Maternal Grandmother   . Anxiety disorder Maternal Grandmother   . Anxiety disorder Daughter     The following portions of the patient's history were reviewed and updated as appropriate: allergies, current medications, past family history, past medical history, past social history, past surgical history and problem list.  Review of Systems Review of Systems - Negative except as mentioned in HPI Review of Systems - General ROS: negative for - chills, fatigue, fever, hot flashes, malaise or night sweats Hematological and Lymphatic ROS: negative for - bleeding problems or swollen lymph nodes Gastrointestinal ROS: negative for - abdominal pain, blood in stools, change in bowel habits and nausea/vomiting Musculoskeletal ROS: negative for - joint pain, muscle pain or muscular weakness Genito-Urinary ROS: negative for - change in menstrual cycle,  dysmenorrhea, dyspareunia, dysuria, genital discharge, genital ulcers, hematuria, incontinence, irregular/heavy menses, nocturia or pelvic painjj  Objective:   BP 136/85   Pulse 72   Ht 5\' 4"  (1.626 m)   Wt 179 lb 8 oz (81.4 kg)   LMP 12/13/2019 (Exact Date)   BMI 30.81 kg/m  CONSTITUTIONAL: Well-developed, well-nourished female in no acute distress.  HENT:  Normocephalic, atraumatic.  NECK: Normal range of motion, supple, no masses.  Normal thyroid.  SKIN: Skin is warm and dry. No rash noted. Not diaphoretic. No erythema. No pallor. [place]: Alert and oriented to person, place, and time. PSYCHIATRIC: Normal mood and affect. Normal behavior. Normal judgment and thought content. CARDIOVASCULAR:Not Examined RESPIRATORY: Not Examined BREASTS: Not Examined ABDOMEN: Soft, non distended; Non tender.  No Organomegaly. PELVIC:  External Genitalia: Normal, moderate redness and irritation noted on labia   BUS: Normal  Vagina: Normal, mild redness, white particulate discharge , no odor   Cervix: Normal  Bladder: Nontender MUSCULOSKELETAL: Normal range of motion. No tenderness.  No cyanosis, clubbing, or edema.     Assessment:   1. Itching in the vaginal area  2. Vaginal burning  3. Vaginal discharge      Plan:   Discussed boric acid , Replens for PH balance. Swab collected.orders placed for Diflucan. Will follow up with results. Encouraged Monistat cream for external irritation. Follow up 2 months for annual exam or prn.   Philip Aspen, CNM

## 2019-12-31 LAB — CERVICOVAGINAL ANCILLARY ONLY
Bacterial Vaginitis (gardnerella): POSITIVE — AB
Candida Glabrata: NEGATIVE
Candida Vaginitis: POSITIVE — AB
Chlamydia: NEGATIVE
Comment: NEGATIVE
Comment: NEGATIVE
Comment: NEGATIVE
Comment: NEGATIVE
Comment: NEGATIVE
Comment: NORMAL
Neisseria Gonorrhea: NEGATIVE
Trichomonas: NEGATIVE

## 2020-01-02 ENCOUNTER — Other Ambulatory Visit: Payer: Self-pay | Admitting: Certified Nurse Midwife

## 2020-01-02 MED ORDER — FLUCONAZOLE 150 MG PO TABS
150.0000 mg | ORAL_TABLET | Freq: Once | ORAL | 1 refills | Status: AC
Start: 2020-01-02 — End: 2020-01-02

## 2020-01-04 ENCOUNTER — Other Ambulatory Visit: Payer: Self-pay | Admitting: Certified Nurse Midwife

## 2020-01-04 MED ORDER — CLINDAMYCIN PHOSPHATE 2 % VA CREA
1.0000 | TOPICAL_CREAM | Freq: Every day | VAGINAL | 0 refills | Status: AC
Start: 2020-01-04 — End: 2020-01-11

## 2020-01-04 MED ORDER — FLUCONAZOLE 150 MG PO TABS
150.0000 mg | ORAL_TABLET | Freq: Once | ORAL | 0 refills | Status: AC
Start: 2020-01-04 — End: 2020-01-04

## 2020-01-04 NOTE — Progress Notes (Signed)
Orders placed for clocin to treat BV, and diflucan for yeast ordered.   Philip Aspen, CNM

## 2020-01-14 ENCOUNTER — Encounter: Payer: BC Managed Care – PPO | Admitting: Internal Medicine

## 2020-01-27 DIAGNOSIS — L718 Other rosacea: Secondary | ICD-10-CM | POA: Diagnosis not present

## 2020-02-09 DIAGNOSIS — Z1152 Encounter for screening for COVID-19: Secondary | ICD-10-CM | POA: Diagnosis not present

## 2020-02-09 DIAGNOSIS — Z20822 Contact with and (suspected) exposure to covid-19: Secondary | ICD-10-CM | POA: Diagnosis not present

## 2020-02-09 DIAGNOSIS — Z03818 Encounter for observation for suspected exposure to other biological agents ruled out: Secondary | ICD-10-CM | POA: Diagnosis not present

## 2020-03-08 ENCOUNTER — Encounter: Payer: Self-pay | Admitting: Certified Nurse Midwife

## 2020-03-13 DIAGNOSIS — Z20822 Contact with and (suspected) exposure to covid-19: Secondary | ICD-10-CM | POA: Diagnosis not present

## 2020-03-13 DIAGNOSIS — Z1152 Encounter for screening for COVID-19: Secondary | ICD-10-CM | POA: Diagnosis not present

## 2020-03-13 DIAGNOSIS — Z03818 Encounter for observation for suspected exposure to other biological agents ruled out: Secondary | ICD-10-CM | POA: Diagnosis not present

## 2020-03-14 DIAGNOSIS — R059 Cough, unspecified: Secondary | ICD-10-CM | POA: Diagnosis not present

## 2020-03-14 DIAGNOSIS — J0100 Acute maxillary sinusitis, unspecified: Secondary | ICD-10-CM | POA: Diagnosis not present

## 2020-04-05 ENCOUNTER — Encounter: Payer: Self-pay | Admitting: Certified Nurse Midwife

## 2020-04-05 ENCOUNTER — Other Ambulatory Visit: Payer: Self-pay | Admitting: Internal Medicine

## 2020-04-17 ENCOUNTER — Encounter: Payer: Self-pay | Admitting: Certified Nurse Midwife

## 2020-04-17 ENCOUNTER — Ambulatory Visit (INDEPENDENT_AMBULATORY_CARE_PROVIDER_SITE_OTHER): Payer: BC Managed Care – PPO | Admitting: Certified Nurse Midwife

## 2020-04-17 ENCOUNTER — Other Ambulatory Visit: Payer: Self-pay

## 2020-04-17 VITALS — BP 120/83 | HR 76 | Ht 64.0 in | Wt 180.2 lb

## 2020-04-17 DIAGNOSIS — Z1159 Encounter for screening for other viral diseases: Secondary | ICD-10-CM

## 2020-04-17 DIAGNOSIS — Z114 Encounter for screening for human immunodeficiency virus [HIV]: Secondary | ICD-10-CM

## 2020-04-17 DIAGNOSIS — Z01419 Encounter for gynecological examination (general) (routine) without abnormal findings: Secondary | ICD-10-CM | POA: Diagnosis not present

## 2020-04-17 DIAGNOSIS — Z8639 Personal history of other endocrine, nutritional and metabolic disease: Secondary | ICD-10-CM

## 2020-04-17 DIAGNOSIS — Z1231 Encounter for screening mammogram for malignant neoplasm of breast: Secondary | ICD-10-CM

## 2020-04-17 NOTE — Patient Instructions (Signed)

## 2020-04-17 NOTE — Progress Notes (Signed)
GYNECOLOGY ANNUAL PREVENTATIVE CARE ENCOUNTER NOTE  History:     Sara Howell is a 44 y.o. G47P3003 female here for a routine annual gynecologic exam.  Current complaints: none, still has rectocele has been dealing with it. Had consult with Dr. Marcelline Mates state she is managing.    Denies abnormal vaginal bleeding, discharge, pelvic pain, problems with intercourse or other gynecologic concerns.     Social Relationship: Married  Living:Spouse and children ( son -48, daughter home from college, older daughter just finished masters).  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   Gynecologic History Patient's last menstrual period was 04/05/2020 (exact date). Contraception: vasectomy Last Pap: 09/26/2015. Results were: normal with negative HPV Last mammogram: 10/18/2015. Results were: normal  Obstetric History OB History  Gravida Para Term Preterm AB Living  3 3 3     3   SAB IAB Ectopic Multiple Live Births          3    # Outcome Date GA Lbr Len/2nd Weight Sex Delivery Anes PTL Lv  3 Term 06/27/09   8 lb 6 oz (3.799 kg) M Vag-Spont  N LIV  2 Term 10/03/00   9 lb 6 oz (4.252 kg) F Vag-Spont  N LIV  1 Term 06/19/94   8 lb 3 oz (3.714 kg) F Vag-Spont   LIV    Past Medical History:  Diagnosis Date  . Anal fissure   . Anemia   . Anxiety   . Depression   . Hashimoto's thyroiditis 03/10/2017  . Hypothyroidism 10/12   post partum only, no further need for meds  . PONV (postoperative nausea and vomiting)   . PVC (premature ventricular contraction)    no meds no cardiologist routinely    Past Surgical History:  Procedure Laterality Date  . CERVICAL DISCECTOMY  2007   No specified location  . GANGLION CYST EXCISION Left 2011  . LAPAROSCOPY  09/12/2011   Procedure: LAPAROSCOPY OPERATIVE;  Surgeon: Marylynn Pearson, MD;  Location: [facility];  Service: Gynecology;  Laterality: Right;  right oophorectomy   . MUSCLE BIOPSY  as a teenager   non specific; biopsy  . NSVD     x 2  . WISDOM TOOTH EXTRACTION      Current Outpatient Medications on File Prior to Visit  Medication Sig Dispense Refill  . Insulin Pen Needle (PEN NEEDLES) 30G X 8 MM MISC Use to inject Saxenda once daily. 100 each 3  . levothyroxine (SYNTHROID) 75 MCG tablet Take 1 tablet (75 mcg total) by mouth daily. 90 tablet 3  . Liraglutide -Weight Management (SAXENDA) 18 MG/3ML SOPN Inject 0.3 mLs (1.8 mg total) into the skin daily. 3 pen 5  . omeprazole (PRILOSEC) 20 MG capsule TAKE 1 CAPSULE BY MOUTH EVERY DAY 30 capsule 6   No current facility-administered medications on file prior to visit.    Allergies  Allergen Reactions  . Corn-Containing Products   . Eggs Or Egg-Derived Products     Large amounts causes tightness in chest  . Other     Honey Chest tightness  . Peanut-Containing Drug Products     Social History:  reports that she has never smoked. She has never used smokeless tobacco. She reports previous alcohol use. She reports that she does not use drugs.  Family History  Problem Relation Age of Onset  . Hepatitis Father        Hep  C  . Hypertension Father   . Drug abuse Father   . Alcohol abuse Father   . Anxiety disorder Father   . Hypothyroidism Sister   . Crohn's disease Brother   . Diabetes Paternal Grandfather   . Leukemia Paternal Grandfather   . Seizures Maternal Aunt        Epilepsy  . Seizures Maternal Uncle        Epilepsy  . Liver cancer Maternal Uncle   . Depression Maternal Grandmother   . Anxiety disorder Maternal Grandmother   . Anxiety disorder Daughter     The following portions of the patient's history were reviewed and updated as appropriate: allergies, current medications, past family history, past medical history, past social history, past surgical history and problem list.  Review of Systems Pertinent items noted in HPI and remainder of comprehensive ROS otherwise  negative.  Physical Exam:  BP 120/83   Pulse 76   Ht 5\' 4"  (1.626 m)   Wt 180 lb 3 oz (81.7 kg)   LMP 04/05/2020 (Exact Date)   BMI 30.93 kg/m  CONSTITUTIONAL: Well-developed, well-nourished, over weightfemale in no acute distress.  HENT:  Normocephalic, atraumatic, External right and left ear normal. Oropharynx is clear and moist EYES: Conjunctivae and EOM are normal. Pupils are equal, round, and reactive to light. No scleral icterus.  NECK: Normal range of motion, supple, no masses.  Normal thyroid.  SKIN: Skin is warm and dry. No rash noted. Not diaphoretic. No erythema. No pallor.Small lipoma on right side under rib cage, pt state has been there and no significant change over the years. Has become bothersome because her pants rub it.  MUSCULOSKELETAL: Normal range of motion. No tenderness.  No cyanosis, clubbing, or edema.  2+ distal pulses. NEUROLOGIC: Alert and oriented to person, place, and time. Normal reflexes, muscle tone coordination.  PSYCHIATRIC: Normal mood and affect. Normal behavior. Normal judgment and thought content. CARDIOVASCULAR: Normal heart rate noted, regular rhythm RESPIRATORY: Clear to auscultation bilaterally. Effort and breath sounds normal, no problems with respiration noted. BREASTS: Symmetric in size. No masses, tenderness, skin changes, nipple drainage, or lymphadenopathy bilaterally.  ABDOMEN: Soft, no distention noted.  No tenderness, rebound or guarding.  PELVIC: Normal appearing external genitalia and urethral meatus; normal appearing vaginal mucosa and cervix.  No abnormal discharge noted.  Pap smear obtained.  Normal uterine size, no other palpable masses, no uterine or adnexal tenderness.  .   Assessment and Plan:    1. Women's annual routine gynecological examination  Pap: not indicated until 2022 Mammogram : ordered Labs:Hep C, HIV, Vitamin D Refills:none Referral: none Routine preventative health maintenance measures emphasized. Please refer  to After Visit Summary for other counseling recommendations.      Philip Aspen, CNM [facility]

## 2020-04-18 ENCOUNTER — Other Ambulatory Visit: Payer: Self-pay | Admitting: Certified Nurse Midwife

## 2020-04-18 LAB — HEPATITIS C ANTIBODY: Hep C Virus Ab: <0.1 s/co ratio (ref 0.0–0.9)

## 2020-04-18 LAB — VITAMIN D 25 HYDROXY (VIT D DEFICIENCY, FRACTURES): Vit D, 25-Hydroxy: 25.9 ng/mL — ABNORMAL LOW (ref 30.0–100.0)

## 2020-04-18 LAB — HIV ANTIBODY (ROUTINE TESTING W REFLEX): HIV Screen 4th Generation wRfx: NONREACTIVE

## 2020-04-18 MED ORDER — ERGOCALCIFEROL 1.25 MG (50000 UT) PO CAPS: 50000.0000 [IU] | ORAL_CAPSULE | ORAL | 6 refills | Status: DC

## 2020-05-14 ENCOUNTER — Other Ambulatory Visit: Payer: Self-pay | Admitting: Internal Medicine

## 2020-09-11 ENCOUNTER — Other Ambulatory Visit: Payer: Self-pay | Admitting: Internal Medicine

## 2020-09-14 ENCOUNTER — Telehealth: Payer: Self-pay | Admitting: Internal Medicine

## 2020-09-14 NOTE — Telephone Encounter (Signed)
Called pt for appt reminder. She told me she had a sore throat for three days. She tested today and it was negative. She does not have any other symptoms and she told me it was not covid relative.

## 2020-09-14 NOTE — Telephone Encounter (Signed)
If she tested negative and has not been exposed then she can come on in.

## 2020-09-15 ENCOUNTER — Encounter: Payer: Self-pay | Admitting: Internal Medicine

## 2020-09-15 ENCOUNTER — Ambulatory Visit: Payer: BC Managed Care – PPO | Admitting: Internal Medicine

## 2020-09-15 ENCOUNTER — Other Ambulatory Visit: Payer: Self-pay

## 2020-09-15 VITALS — BP 124/84 | HR 63 | Temp 96.3°F | Ht 64.0 in | Wt 181.0 lb

## 2020-09-15 DIAGNOSIS — E669 Obesity, unspecified: Secondary | ICD-10-CM

## 2020-09-15 MED ORDER — SEMAGLUTIDE(0.25 OR 0.5MG/DOS) 2 MG/1.5ML ~~LOC~~ SOPN: 0.2500 mg | PEN_INJECTOR | SUBCUTANEOUS | 3 refills | Status: DC

## 2020-09-15 NOTE — Patient Instructions (Signed)
Please take 0.25mg  of the semaglutide weekly for 1 month---then increase to 0.5mg  weekly for another month. Let me know how you are doing then--and I can send a prescription for the higher dose if you are ready to go up.   [email or web address]">  DASH Eating Plan DASH stands for Dietary Approaches to Stop Hypertension. The DASH eating plan is a healthy eating plan that has been shown to:  Reduce high blood pressure (hypertension).  Reduce your risk for type 2 diabetes, heart disease, and stroke.  Help with weight loss. What are tips for following this plan? Reading food labels  Check food labels for the amount of salt (sodium) per serving. Choose foods with less than 5 percent of the Daily Value of sodium. Generally, foods with less than 300 milligrams (mg) of sodium per serving fit into this eating plan.  To find whole grains, look for the word "whole" as the first word in the ingredient list. Shopping  Buy products labeled as "low-sodium" or "no salt added."  Buy fresh foods. Avoid canned foods and pre-made or frozen meals. Cooking  Avoid adding salt when cooking. Use salt-free seasonings or herbs instead of table salt or sea salt. Check with your health care provider or pharmacist before using salt substitutes.  Do not fry foods. Cook foods using healthy methods such as baking, boiling, grilling, roasting, and broiling instead.  Cook with heart-healthy oils, such as olive, canola, avocado, soybean, or sunflower oil. Meal planning  Eat a balanced diet that includes: ? 4 or more servings of fruits and 4 or more servings of vegetables each day. Try to fill one-half of your plate with fruits and vegetables. ? 6-8 servings of whole grains each day. ? Less than 6 oz (170 g) of lean meat, poultry, or fish each day. A 3-oz (85-g) serving of meat is about the same size as a deck of cards. One egg equals 1 oz (28 g). ? 2-3 servings of low-fat  dairy each day. One serving is 1 cup (237 mL). ? 1 serving of nuts, seeds, or beans 5 times each week. ? 2-3 servings of heart-healthy fats. Healthy fats called omega-3 fatty acids are found in foods such as walnuts, flaxseeds, fortified milks, and eggs. These fats are also found in cold-water fish, such as sardines, salmon, and mackerel.  Limit how much you eat of: ? Canned or prepackaged foods. ? Food that is high in trans fat, such as some fried foods. ? Food that is high in saturated fat, such as fatty meat. ? Desserts and other sweets, sugary drinks, and other foods with added sugar. ? Full-fat dairy products.  Do not salt foods before eating.  Do not eat more than 4 egg yolks a week.  Try to eat at least 2 vegetarian meals a week.  Eat more home-cooked food and less restaurant, buffet, and fast food.   Lifestyle  When eating at a restaurant, ask that your food be prepared with less salt or no salt, if possible.  If you drink alcohol: ? Limit how much you use to:  0-1 drink a day for women who are not pregnant.  0-2 drinks a day for men. ? Be aware of how much alcohol is in your drink. In the [place] one drink equals one 12 oz bottle of beer (355 mL), one 5 oz glass of wine (148 mL), or one 1 oz glass of hard liquor (44 mL). General information  Avoid eating more than 2,300 mg of salt  a day. If you have hypertension, you may need to reduce your sodium intake to 1,500 mg a day.  Work with your health care provider to maintain a healthy body weight or to lose weight. Ask what an ideal weight is for you.  Get at least 30 minutes of exercise that causes your heart to beat faster (aerobic exercise) most days of the week. Activities may include walking, swimming, or biking.  Work with your health care provider or dietitian to adjust your eating plan to your individual calorie needs. What foods should I eat? Fruits All fresh, dried, or frozen fruit. Canned fruit in natural juice  (without added sugar). Vegetables Fresh or frozen vegetables (raw, steamed, roasted, or grilled). Low-sodium or reduced-sodium tomato and vegetable juice. Low-sodium or reduced-sodium tomato sauce and tomato paste. Low-sodium or reduced-sodium canned vegetables. Grains Whole-grain or whole-wheat bread. Whole-grain or whole-wheat pasta. Brown rice. [place]. Bulgur. Whole-grain and low-sodium cereals. Pita bread. Low-fat, low-sodium crackers. Whole-wheat flour tortillas. Meats and other proteins Skinless chicken or [place]. Ground chicken or [place]. Pork with fat trimmed off. Fish and seafood. Egg whites. Dried beans, peas, or lentils. Unsalted nuts, nut butters, and seeds. Unsalted canned beans. Lean cuts of beef with fat trimmed off. Low-sodium, lean precooked or cured meat, such as sausages or meat loaves. Dairy Low-fat (1%) or fat-free (skim) milk. Reduced-fat, low-fat, or fat-free cheeses. Nonfat, low-sodium ricotta or cottage cheese. Low-fat or nonfat yogurt. Low-fat, low-sodium cheese. Fats and oils Soft margarine without trans fats. Vegetable oil. Reduced-fat, low-fat, or light mayonnaise and salad dressings (reduced-sodium). Canola, safflower, olive, avocado, soybean, and sunflower oils. Avocado. Seasonings and condiments Herbs. Spices. Seasoning mixes without salt. Other foods Unsalted popcorn and pretzels. Fat-free sweets. The items listed above may not be a complete list of foods and beverages you can eat. Contact a dietitian for more information. What foods should I avoid? Fruits Canned fruit in a light or heavy syrup. Fried fruit. Fruit in cream or butter sauce. Vegetables Creamed or fried vegetables. Vegetables in a cheese sauce. Regular canned vegetables (not low-sodium or reduced-sodium). Regular canned tomato sauce and paste (not low-sodium or reduced-sodium). Regular tomato and vegetable juice (not low-sodium or reduced-sodium). Angie Fava. Olives. Grains Baked goods made  with fat, such as croissants, muffins, or some breads. Dry pasta or rice meal packs. Meats and other proteins Fatty cuts of meat. Ribs. Fried meat. Berniece Salines. Bologna, salami, and other precooked or cured meats, such as sausages or meat loaves. Fat from the back of a pig (fatback). Bratwurst. Salted nuts and seeds. Canned beans with added salt. Canned or smoked fish. Whole eggs or egg yolks. Chicken or [place] with skin. Dairy Whole or 2% milk, cream, and half-and-half. Whole or full-fat cream cheese. Whole-fat or sweetened yogurt. Full-fat cheese. Nondairy creamers. Whipped toppings. Processed cheese and cheese spreads. Fats and oils Butter. Stick margarine. Lard. Shortening. Ghee. Bacon fat. Tropical oils, such as coconut, palm kernel, or palm oil. Seasonings and condiments Onion salt, garlic salt, seasoned salt, table salt, and sea salt. Worcestershire sauce. Tartar sauce. Barbecue sauce. Teriyaki sauce. Soy sauce, including reduced-sodium. Steak sauce. Canned and packaged gravies. Fish sauce. Oyster sauce. Cocktail sauce. Store-bought horseradish. Ketchup. Mustard. Meat flavorings and tenderizers. Bouillon cubes. Hot sauces. Pre-made or packaged marinades. Pre-made or packaged taco seasonings. Relishes. Regular salad dressings. Other foods Salted popcorn and pretzels. The items listed above may not be a complete list of foods and beverages you should avoid. Contact a dietitian for more information. Where to find  more information  National Heart, Lung, and Blood Institute: https://wilson-eaton.com/  American Heart Association: www.heart.org  Academy of Nutrition and Dietetics: www.eatright.[place]: www.kidney.org Summary  The DASH eating plan is a healthy eating plan that has been shown to reduce high blood pressure (hypertension). It may also reduce your risk for type 2 diabetes, heart disease, and stroke.  When on the DASH eating plan, aim to eat more fresh fruits and  vegetables, whole grains, lean proteins, low-fat dairy, and heart-healthy fats.  With the DASH eating plan, you should limit salt (sodium) intake to 2,300 mg a day. If you have hypertension, you may need to reduce your sodium intake to 1,500 mg a day.  Work with your health care provider or dietitian to adjust your eating plan to your individual calorie needs. This information is not intended to replace advice given to you by your health care provider. Make sure you discuss any questions you have with your health care provider. Document Revised: 03/19/2019 Document Reviewed: 03/19/2019 Elsevier Patient Education  2021 [employer].

## 2020-09-15 NOTE — Progress Notes (Signed)
Subjective:    Patient ID: Sara Howell, female    DOB: 1976-02-21, 45 y.o.   MRN: [id number]  HPI Here due to trouble losing weight This visit occurred during the SARS-CoV-2 public health emergency.  Safety protocols were in place, including screening questions prior to the visit, additional usage of staff PPE, and extensive cleaning of exam room while observing appropriate contact time as indicated for disinfecting solutions.   Feels the saxenda "has run its course" Started about a year ago Down 15# or so from her heaviest  Taking 1.8mg  Initial nausea--but that has abated May increase her fatigue She did try increasing to 3mg  and her appetite actually increased  Current Outpatient Medications on File Prior to Visit  Medication Sig Dispense Refill  . Insulin Pen Needle (PEN NEEDLES) 30G X 8 MM MISC Use to inject Saxenda once daily. 100 each 3  . levothyroxine (SYNTHROID) 75 MCG tablet TAKE 1 TABLET BY MOUTH EVERY DAY 90 tablet 0  . omeprazole (PRILOSEC) 20 MG capsule TAKE 1 CAPSULE BY MOUTH EVERY DAY 30 capsule 6  . SAXENDA 18 MG/3ML SOPN INJECT 1.8 MG UNDER THE SKIN ONCE DAILY 15 mL 5   No current facility-administered medications on file prior to visit.    Allergies  Allergen Reactions  . Corn-Containing Products   . Eggs Or Egg-Derived Products     Large amounts causes tightness in chest  . Other     Honey Chest tightness  . Peanut-Containing Drug Products     Past Medical History:  Diagnosis Date  . Anal fissure   . Anemia   . Anxiety   . Depression   . Hashimoto's thyroiditis 03/10/2017  . Hypothyroidism 10/12   post partum only, no further need for meds  . PONV (postoperative nausea and vomiting)   . PVC (premature ventricular contraction)    no meds no cardiologist routinely    Past Surgical History:  Procedure Laterality Date  . CERVICAL DISCECTOMY  2007   No specified location  . GANGLION CYST EXCISION Left 2011  . LAPAROSCOPY  09/12/2011    Procedure: LAPAROSCOPY OPERATIVE;  Surgeon: Marylynn Pearson, MD;  Location: [facility];  Service: Gynecology;  Laterality: Right;  right oophorectomy  . MUSCLE BIOPSY  as a teenager   non specific; biopsy  . NSVD     x 2  . WISDOM TOOTH EXTRACTION      Family History  Problem Relation Age of Onset  . Hepatitis Father        Hep C  . Hypertension Father   . Drug abuse Father   . Alcohol abuse Father   . Anxiety disorder Father   . Hypothyroidism Sister   . Crohn's disease Brother   . Diabetes Paternal Grandfather   . Leukemia Paternal Grandfather   . Seizures Maternal Aunt        Epilepsy  . Seizures Maternal Uncle        Epilepsy  . Liver cancer Maternal Uncle   . Depression Maternal Grandmother   . Anxiety disorder Maternal Grandmother   . Anxiety disorder Daughter     Social History   Socioeconomic History  . Marital status: Married    Spouse name: jimmy  . Number of children: 3  . Years of education: Not on file  . Highest education level: High school graduate  Occupational History  . Occupation: Tree surgeon    Comment: [employer]  Tobacco Use  . Smoking status: Never Smoker  .  Smokeless tobacco: Never Used  Vaping Use  . Vaping Use: Never used  Substance and Sexual Activity  . Alcohol use: Not Currently    Comment: Rarely  . Drug use: No  . Sexual activity: Yes    Birth control/protection: None    Comment: husband vasectomy  Other Topics Concern  . Not on file  Social History Narrative   Pt gets regular exercise with cardio 2 days a week and 3 classes a week.   Diet consists of fruits and veggies, doesn't like meat much and likes soda and sweet tea.   Social Determinants of Health   Financial Resource Strain: Not on file  Food Insecurity: Not on file  Transportation Needs: Not on file  Physical Activity: Not on file  Stress: Not on file  Social Connections: Not on file  Intimate Partner Violence: Not on file   Review of Systems  No sleep  problems Is careful about her eating     Objective:   Physical Exam Constitutional:      Appearance: Normal appearance.  Neurological:     Mental Status: She is alert.            Assessment & Plan:

## 2020-09-15 NOTE — Assessment & Plan Note (Signed)
Finds the liraglutide is not helping now Discussed alternatives and she has done some research Continues to exercise and eat healthy Will switch to semaglutide

## 2020-09-21 DIAGNOSIS — J069 Acute upper respiratory infection, unspecified: Secondary | ICD-10-CM | POA: Diagnosis not present

## 2020-10-18 MED ORDER — SEMAGLUTIDE (1 MG/DOSE) 4 MG/3ML ~~LOC~~ SOPN: 1.0000 mg | PEN_INJECTOR | SUBCUTANEOUS | 11 refills | Status: DC

## 2020-11-01 ENCOUNTER — Other Ambulatory Visit: Payer: Self-pay

## 2020-11-01 ENCOUNTER — Encounter: Payer: Self-pay | Admitting: Internal Medicine

## 2020-11-01 ENCOUNTER — Ambulatory Visit (INDEPENDENT_AMBULATORY_CARE_PROVIDER_SITE_OTHER): Payer: BC Managed Care – PPO | Admitting: Internal Medicine

## 2020-11-01 VITALS — BP 130/78 | HR 77 | Temp 97.4°F | Ht 64.0 in | Wt 179.0 lb

## 2020-11-01 DIAGNOSIS — E039 Hypothyroidism, unspecified: Secondary | ICD-10-CM

## 2020-11-01 DIAGNOSIS — Z1211 Encounter for screening for malignant neoplasm of colon: Secondary | ICD-10-CM

## 2020-11-01 DIAGNOSIS — K2100 Gastro-esophageal reflux disease with esophagitis, without bleeding: Secondary | ICD-10-CM

## 2020-11-01 DIAGNOSIS — E669 Obesity, unspecified: Secondary | ICD-10-CM | POA: Diagnosis not present

## 2020-11-01 DIAGNOSIS — Z0000 Encounter for general adult medical examination without abnormal findings: Secondary | ICD-10-CM | POA: Diagnosis not present

## 2020-11-01 LAB — COMPREHENSIVE METABOLIC PANEL
ALT: 14 U/L (ref 0–35)
AST: 18 U/L (ref 0–37)
Albumin: 4.3 g/dL (ref 3.5–5.2)
Alkaline Phosphatase: 49 U/L (ref 39–117)
BUN: 13 mg/dL (ref 6–23)
CO2: 28 mEq/L (ref 19–32)
Calcium: 9 mg/dL (ref 8.4–10.5)
Chloride: 107 mEq/L (ref 96–112)
Creatinine, Ser: 0.76 mg/dL (ref 0.40–1.20)
GFR: 94.39 mL/min (ref >60.00)
Glucose, Bld: 76 mg/dL (ref 70–99)
Potassium: 4 mEq/L (ref 3.5–5.1)
Sodium: 140 mEq/L (ref 135–145)
Total Bilirubin: 0.9 mg/dL (ref 0.2–1.2)
Total Protein: 6.6 g/dL (ref 6.0–8.3)

## 2020-11-01 LAB — TSH: TSH: 3.31 u[IU]/mL (ref 0.35–5.50)

## 2020-11-01 LAB — CBC
HCT: 38.3 % (ref 36.0–46.0)
Hemoglobin: 13.1 g/dL (ref 12.0–15.0)
MCHC: 34.2 g/dL (ref 30.0–36.0)
MCV: 94 fl (ref 78.0–100.0)
Platelets: 225 10*3/uL (ref 150.0–400.0)
RBC: 4.08 Mil/uL (ref 3.87–5.11)
RDW: 12.7 % (ref 11.5–15.5)
WBC: 4.2 10*3/uL (ref 4.0–10.5)

## 2020-11-01 LAB — T4, FREE: Free T4: 0.85 ng/dL (ref 0.60–1.60)

## 2020-11-01 NOTE — Assessment & Plan Note (Signed)
Having symptoms even on the omeprazole (on empty stomach) Will add evening famotidine

## 2020-11-01 NOTE — Patient Instructions (Signed)
Please try adding over the counter famotidine 20mg  at bedtime.

## 2020-11-01 NOTE — Assessment & Plan Note (Signed)
Working on fitness Doesn't want COVID or flu vaccines Prefers no tetanus booster ---is due Discussed colon cancer screening--will do FIT for now

## 2020-11-01 NOTE — Progress Notes (Signed)
Subjective:    Patient ID: Sara Howell, female    DOB: 1976/03/09, 45 y.o.   MRN: [id number]  HPI Here for physical This visit occurred during the SARS-CoV-2 public health emergency.  Safety protocols were in place, including screening questions prior to the visit, additional usage of staff PPE, and extensive cleaning of exam room while observing appropriate contact time as indicated for disinfecting solutions.   No side effects with the higher dose of semaglutide Some nausea--may be related to acid reflux. This may be due to not eating later in day Appetite is down--doesn't think about food on this Weight hasn't really gone down though  Takes the omeprazole every day Needs tums sometimes  Now on prescription vitamin D from gyn Ongoing endometriosis symptoms---especially dyspareunia May need to proceed to hysterectomy  Current Outpatient Medications on File Prior to Visit  Medication Sig Dispense Refill   levothyroxine (SYNTHROID) 75 MCG tablet TAKE 1 TABLET BY MOUTH EVERY DAY 90 tablet 0   omeprazole (PRILOSEC) 20 MG capsule TAKE 1 CAPSULE BY MOUTH EVERY DAY 30 capsule 6   Semaglutide, 1 MG/DOSE, (OZEMPIC, 1 MG/DOSE,) 4 MG/3ML SOPN Inject 1 mg into the skin once a week.     No current facility-administered medications on file prior to visit.    Allergies  Allergen Reactions   Corn-Containing Products    Eggs Or Egg-Derived Products     Large amounts causes tightness in chest   Other     Honey Chest tightness   Peanut-Containing Drug Products     Past Medical History:  Diagnosis Date   Anal fissure    Anemia    Anxiety    Depression    Hashimoto's thyroiditis 03/10/2017   Hypothyroidism 10/12   post partum only, no further need for meds   PONV (postoperative nausea and vomiting)    PVC (premature ventricular contraction)    no meds no cardiologist routinely    Past Surgical History:  Procedure Laterality Date   CERVICAL DISCECTOMY  2007   No specified  location   GANGLION CYST EXCISION Left 2011   LAPAROSCOPY  09/12/2011   Procedure: LAPAROSCOPY OPERATIVE;  Surgeon: Marylynn Pearson, MD;  Location: [facility];  Service: Gynecology;  Laterality: Right;  right oophorectomy   MUSCLE BIOPSY  as a teenager   non specific; biopsy   NSVD     x 2   WISDOM TOOTH EXTRACTION      Family History  Problem Relation Age of Onset   Hepatitis Father        Hep C   Hypertension Father    Drug abuse Father    Alcohol abuse Father    Anxiety disorder Father    Hypothyroidism Sister    Crohn's disease Brother    Diabetes Paternal Grandfather    Leukemia Paternal Grandfather    Seizures Maternal Aunt        Epilepsy   Seizures Maternal Uncle        Epilepsy   Liver cancer Maternal Uncle    Depression Maternal Grandmother    Anxiety disorder Maternal Grandmother    Anxiety disorder Daughter     Social History   Socioeconomic History   Marital status: Married    Spouse name: jimmy   Number of children: 3   Years of education: Not on file   Highest education level: High school graduate  Occupational History   Occupation: Tree surgeon    Comment: [employer]  Tobacco Use  Smoking status: Never   Smokeless tobacco: Never  Vaping Use   Vaping Use: Never used  Substance and Sexual Activity   Alcohol use: Not Currently    Comment: Rarely   Drug use: No   Sexual activity: Yes    Birth control/protection: None    Comment: husband vasectomy  Other Topics Concern   Not on file  Social History Narrative   Pt gets regular exercise with cardio 2 days a week and 3 classes a week.   Diet consists of fruits and veggies, doesn't like meat much and likes soda and sweet tea.   Social Determinants of Health   Financial Resource Strain: Not on file  Food Insecurity: Not on file  Transportation Needs: Not on file  Physical Activity: Not on file  Stress: Not on file  Social Connections: Not on file  Intimate Partner Violence: Not on file    Review of Systems  Constitutional:  Negative for fatigue and unexpected weight change.       Wears seat belt  HENT:  Negative for dental problem, hearing loss, tinnitus and trouble swallowing.        Keeps up with dentist  Eyes:        Occasional floaters  No diplopia  Respiratory:  Negative for cough, chest tightness and shortness of breath.   Cardiovascular:  Negative for chest pain and leg swelling.       Occ skipped beats Some leg swelling when she travels  Gastrointestinal:  Negative for blood in stool and constipation.  Endocrine: Negative for polydipsia and polyuria.  Genitourinary:  Positive for dyspareunia. Negative for dysuria and hematuria.  Musculoskeletal:  Negative for back pain and joint swelling.       Gets "bone pain"---relates to the low vitamin D Some intermittent back pain (past surgery)====stretching and massage help  Skin:  Negative for rash.  Allergic/Immunologic: Negative for immunocompromised state.       Uses OTC prn in season  Neurological:  Negative for syncope and light-headedness.       Vertigo with periods----better now Menstrual headaches---2 tylenol/1 advil helps  Hematological:  Negative for adenopathy. Bruises/bleeds easily.  Psychiatric/Behavioral:  Negative for dysphoric mood and sleep disturbance. The patient is not nervous/anxious.       Objective:   Physical Exam Constitutional:      Appearance: Normal appearance.  HENT:     Right Ear: Tympanic membrane and ear canal normal.     Left Ear: Tympanic membrane and ear canal normal.     Mouth/Throat:     Pharynx: No oropharyngeal exudate or posterior oropharyngeal erythema.  Eyes:     Conjunctiva/sclera: Conjunctivae normal.     Pupils: Pupils are equal, round, and reactive to light.  Cardiovascular:     Rate and Rhythm: Normal rate and regular rhythm.     Pulses: Normal pulses.     Heart sounds: No murmur heard.   No gallop.  Pulmonary:     Effort: Pulmonary effort is normal.      Breath sounds: Normal breath sounds. No wheezing or rales.  Abdominal:     Palpations: Abdomen is soft.     Tenderness: There is no abdominal tenderness.  Musculoskeletal:     Cervical back: Neck supple.     Right lower leg: No edema.     Left lower leg: No edema.  Lymphadenopathy:     Cervical: No cervical adenopathy.  Skin:    General: Skin is warm.     Findings:  No rash.     Comments: Cherry angioma on right thigh  Neurological:     General: No focal deficit present.     Mental Status: She is alert and oriented to person, place, and time.  Psychiatric:        Mood and Affect: Mood normal.        Behavior: Behavior normal.           Assessment & Plan:

## 2020-11-01 NOTE — Assessment & Plan Note (Signed)
Tolerating the semaglutide If not responding with weight loss in the next 1-2 months, will increase to 2mg  weekly

## 2020-11-01 NOTE — Assessment & Plan Note (Signed)
Will check labs

## 2020-11-26 ENCOUNTER — Other Ambulatory Visit: Payer: Self-pay | Admitting: Internal Medicine

## 2020-12-12 ENCOUNTER — Other Ambulatory Visit: Payer: Self-pay | Admitting: Internal Medicine

## 2020-12-25 ENCOUNTER — Telehealth: Payer: Self-pay | Admitting: Certified Nurse Midwife

## 2020-12-25 NOTE — Telephone Encounter (Signed)
Spoke with patient in regards to heavy bleeding; patient states she is soaking a super sized tampon every hour to hour and a half. Advised patient if she continues to bleed heavy to the point of concern to visit ED or urgent care. Patient agrees to plan and is scheduled for an appointment to follow up for heavy bleeding. No further concerns voiced from patient at this time.

## 2020-12-25 NOTE — Telephone Encounter (Signed)
Sara Howell called in and states she has been bleeding heavily for a week.  Sara Howell states she is going through a regular tampon about every hour but has not tried to go up a size in tampons.  Sara Howell states her bleeding is bright red and this isn't normal for her.  Sara Howell states normal for her is typically heavy the first day or two and then tapers down, however, this time is it really heavy for a straight week.  Please advise.

## 2020-12-26 ENCOUNTER — Other Ambulatory Visit (HOSPITAL_COMMUNITY)
Admission: RE | Admit: 2020-12-26 | Discharge: 2020-12-26 | Disposition: A | Discharge status: Home / Self Care | Payer: BC Managed Care – PPO | Source: Ambulatory Visit | Attending: Certified Nurse Midwife | Admitting: Certified Nurse Midwife

## 2020-12-26 ENCOUNTER — Encounter: Payer: Self-pay | Admitting: Certified Nurse Midwife

## 2020-12-26 ENCOUNTER — Ambulatory Visit: Payer: BC Managed Care – PPO | Admitting: Certified Nurse Midwife

## 2020-12-26 ENCOUNTER — Other Ambulatory Visit: Payer: Self-pay

## 2020-12-26 VITALS — BP 111/75 | HR 65 | Ht 65.0 in | Wt 179.2 lb

## 2020-12-26 DIAGNOSIS — N898 Other specified noninflammatory disorders of vagina: Secondary | ICD-10-CM

## 2020-12-26 DIAGNOSIS — N939 Abnormal uterine and vaginal bleeding, unspecified: Secondary | ICD-10-CM

## 2020-12-26 NOTE — Progress Notes (Signed)
GYN ENCOUNTER NOTE  Subjective:       Sara Howell is a 45 y.o. [phone] female is here for gynecologic evaluation of the following issues:  1. Abnormal uterine bleeding this past month experienced 2 periods. The first one .  Was old blood and for 1-2 days. This current one has been longer and heavier than normal. She also noted increased back discomfort with it.  2.Increase in vaginal discharge.    Gynecologic History Patient's last menstrual period was 12/18/2020 (exact date). Contraception: vasectomy Last Pap: 09/26/2015. Results were: normal Last mammogram: 10/18/2015. Results were: normal  Obstetric History OB History  Gravida Para Term Preterm AB Living  '4 3 3   1 3  '$ SAB IAB Ectopic Multiple Live Births          3    # Outcome Date GA Lbr Len/2nd Weight Sex Delivery Anes PTL Lv  4 Term 06/27/09   8 lb 6 oz (3.799 kg) M Vag-Spont  N LIV  3 AB 2010 [redacted]w[redacted]d        2 Term 10/03/00   9 lb 6 oz (4.252 kg) F Vag-Spont  N LIV  1 Term 06/19/94   8 lb 3 oz (3.714 kg) F Vag-Spont   LIV    Past Medical History:  Diagnosis Date   Anal fissure    Anemia    Anxiety    Depression    Hashimoto's thyroiditis 03/10/2017   Hypothyroidism 10/12   post partum only, no further need for meds   PONV (postoperative nausea and vomiting)    PVC (premature ventricular contraction)    no meds no cardiologist routinely    Past Surgical History:  Procedure Laterality Date   CERVICAL DISCECTOMY  2007   No specified location   GANGLION CYST EXCISION Left 2011   LAPAROSCOPY  09/12/2011   Procedure: LAPAROSCOPY OPERATIVE;  Surgeon: GMarylynn Pearson MD;  [facility];  Service: Gynecology;  Laterality: Right;  right oophorectomy   MUSCLE BIOPSY  as a teenager   non specific; biopsy   NSVD     x 2   WISDOM TOOTH EXTRACTION      Current Outpatient Medications on File Prior to Visit  Medication Sig Dispense Refill   levothyroxine (SYNTHROID) 75 MCG tablet TAKE 1 TABLET BY MOUTH EVERY DAY 90  tablet 3   omeprazole (PRILOSEC) 20 MG capsule TAKE 1 CAPSULE BY MOUTH EVERY DAY 90 capsule 3   Semaglutide, 1 MG/DOSE, (OZEMPIC, 1 MG/DOSE,) 4 MG/3ML SOPN Inject 1 mg into the skin once a week.     famotidine (PEPCID) 20 MG tablet Take 20 mg by mouth at bedtime. (Patient not taking: Reported on 12/26/2020)     Vitamin D, Ergocalciferol, (DRISDOL) 1.25 MG (50000 UNIT) CAPS capsule Take 50,000 Units by mouth every 7 (seven) days. (Patient not taking: Reported on 12/26/2020)     No current facility-administered medications on file prior to visit.    Allergies  Allergen Reactions   Corn-Containing Products    Eggs Or Egg-Derived Products     Large amounts causes tightness in chest   Other     Honey Chest tightness   Peanut-Containing Drug Products     Social History   Socioeconomic History   Marital status: Married    Spouse name: jimmy   Number of children: 3   Years of education: Not on file   Highest education level: High school graduate  Occupational History   Occupation: STree surgeon  Comment: [employer]  Tobacco Use   Smoking status: Never   Smokeless tobacco: Never  Vaping Use   Vaping Use: Never used  Substance and Sexual Activity   Alcohol use: Not Currently    Comment: Rarely   Drug use: No   Sexual activity: Yes    Birth control/protection: None    Comment: husband vasectomy  Other Topics Concern   Not on file  Social History Narrative   Pt gets regular exercise with cardio 2 days a week and 3 classes a week.   Diet consists of fruits and veggies, doesn't like meat much and likes soda and sweet tea.   Social Determinants of Health   Financial Resource Strain: Not on file  Food Insecurity: Not on file  Transportation Needs: Not on file  Physical Activity: Not on file  Stress: Not on file  Social Connections: Not on file  Intimate Partner Violence: Not on file    Family History  Problem Relation Age of Onset   Hepatitis Father        Hep C    Hypertension Father    Drug abuse Father    Alcohol abuse Father    Anxiety disorder Father    Hypothyroidism Sister    Crohn's disease Brother    Diabetes Paternal Grandfather    Leukemia Paternal Grandfather    Seizures Maternal Aunt        Epilepsy   Seizures Maternal Uncle        Epilepsy   Liver cancer Maternal Uncle    Depression Maternal Grandmother    Anxiety disorder Maternal Grandmother    Anxiety disorder Daughter     The following portions of the patient's history were reviewed and updated as appropriate: allergies, current medications, past family history, past medical history, past social history, past surgical history and problem list.  Review of Systems Review of Systems - Negative except as mentioned in HPI Review of Systems - General ROS: negative for - chills, fatigue, fever, hot flashes, malaise or night sweats Hematological and Lymphatic ROS: negative for - bleeding problems or swollen lymph nodes Gastrointestinal ROS: negative for - abdominal pain, blood in stools, change in bowel habits and nausea/vomiting Musculoskeletal ROS: negative for - joint pain, muscle pain or muscular weakness Genito-Urinary ROS: negative for - dysmenorrhea, dyspareunia, dysuria, genital discharge, genital ulcers, hematuria, incontinence, irregular, nocturia or pelvic pain. Positive for  change in menstrual cycle, heavy menses, increased vaginal discharge.   Objective:   BP 111/75   Pulse 65   Ht '5\' 5"'$  (1.651 m)   Wt 179 lb 3.2 oz (81.3 kg)   LMP 12/18/2020 (Exact Date)   BMI 29.82 kg/m  CONSTITUTIONAL: Well-developed, well-nourished female in no acute distress.  HENT:  Normocephalic, atraumatic.  NECK: Normal range of motion, supple, no masses.  Normal thyroid.  SKIN: Skin is warm and dry. No rash noted. Not diaphoretic. No erythema. No pallor. [place]: Alert and oriented to person, place, and time. PSYCHIATRIC: Normal mood and affect. Normal behavior. Normal judgment and  thought content. CARDIOVASCULAR:Not Examined RESPIRATORY: Not Examined BREASTS: Not Examined ABDOMEN: Soft, non distended; Non tender.  No Organomegaly. PELVIC:  External Genitalia: Normal  BUS: Normal  Vagina: Normal  Cervix: Normal, scant about of blood , no polyps, no odor  MUSCULOSKELETAL: Normal range of motion. No tenderness.  No cyanosis, clubbing, or edema.     Assessment:   Abnormal uterine bleeding Vaginal discharge   Plan:   Discussed possible caused for increased  bleeding , including polyps, fibroids, perimenopausal changes. Discussed u/s to evaluate endometrial thickness, r/o fibroid , r/o polyps. Vaginal swab collected, u/s ordered. Discussed use of lysteda for heavy bleeding. PT states bleeding has been slowing, she declines lysteda at this time. Will follow up with lab results.   Philip Aspen, CNM

## 2020-12-26 NOTE — Patient Instructions (Signed)
Abnormal Uterine Bleeding Abnormal uterine bleeding means bleeding more than usual from your womb (uterus). It can include: Bleeding between menstrual periods. Bleeding after sex. Bleeding that is heavier than normal. Menstrual periods that last longer than usual. Bleeding after you have stopped having your menstrual period (menopause). There are many problems that may cause this. You should see a doctor for any kind of bleeding that is not normal. Treatment depends on the cause of thebleeding. Follow these instructions at home: Medicines Take over-the-counter and prescription medicines only as told by your doctor. Tell your doctor about other medicines that you take. If told by your doctor, stop taking aspirin or medicines that have aspirin in them. These medicines can make you bleed more. You may be given iron pills to replace iron that your body loses because of this condition. Take them as told by your doctor. Managing constipation If you are taking iron pills, you may have trouble pooping (constipation). To prevent or treat trouble pooping, you may need to: Drink enough fluid to keep your pee (urine) pale yellow. Take over-the-counter or prescription medicines. Eat foods that are high in fiber. These include beans, whole grains, and fresh fruits and vegetables. Limit foods that are high in fat and sugar. These include fried or sweet foods. General instructions Watch your condition for any changes. Do not use tampons, douche, or have sex, if your doctor tells you not to. Change your pads often. Get regular exams. This includes pelvic exams and cervical cancer screenings. It is up to you to get the results of any tests that are done. Ask your doctor, or the department that is doing the tests, when your results will be ready. Keep all follow-up visits as told by your doctor. This is important. Contact a doctor if: The bleeding lasts more than 1 week. You feel dizzy at times. You feel  like you may vomit (nausea). You vomit. You feel light-headed or weak. Your symptoms get worse. Get help right away if: You pass out. You have to change pads every hour. You have pain in your belly. You have a fever or chills. You get sweaty. You get weak. You pass large blood clots from your vagina. Summary Abnormal uterine bleeding means bleeding more than usual from your womb (uterus). Any kind of bleeding that is not normal should be checked by a doctor. Treatment depends on the cause of the bleeding. Get help right away if you pass out, you have to change pads every hour, or you pass large blood clots from your vagina. This information is not intended to replace advice given to you by your health care provider. Make sure you discuss any questions you have with your healthcare provider. Document Revised: 02/16/2019 Document Reviewed: 02/16/2019 Elsevier Patient Education  [place].

## 2020-12-28 ENCOUNTER — Other Ambulatory Visit: Payer: Self-pay | Admitting: Certified Nurse Midwife

## 2020-12-28 LAB — CERVICOVAGINAL ANCILLARY ONLY
Bacterial Vaginitis (gardnerella): POSITIVE — AB
Candida Glabrata: NEGATIVE
Candida Vaginitis: NEGATIVE
Chlamydia: NEGATIVE
Comment: NEGATIVE
Comment: NEGATIVE
Comment: NEGATIVE
Comment: NEGATIVE
Comment: NEGATIVE
Comment: NORMAL
Neisseria Gonorrhea: NEGATIVE
Trichomonas: NEGATIVE

## 2020-12-28 MED ORDER — CLINDAMYCIN PHOSPHATE 2 % VA CREA: 1.0000 | TOPICAL_CREAM | Freq: Every day | VAGINAL | 0 refills | Status: AC

## 2021-01-15 ENCOUNTER — Other Ambulatory Visit: Payer: BC Managed Care – PPO

## 2021-01-16 MED ORDER — SEMAGLUTIDE (2 MG/DOSE) 8 MG/3ML ~~LOC~~ SOPN: 2.0000 mg | PEN_INJECTOR | SUBCUTANEOUS | 11 refills | Status: DC

## 2021-01-19 ENCOUNTER — Other Ambulatory Visit: Payer: Self-pay

## 2021-01-19 ENCOUNTER — Ambulatory Visit (INDEPENDENT_AMBULATORY_CARE_PROVIDER_SITE_OTHER): Payer: BC Managed Care – PPO

## 2021-01-19 DIAGNOSIS — N939 Abnormal uterine and vaginal bleeding, unspecified: Secondary | ICD-10-CM

## 2021-01-30 ENCOUNTER — Other Ambulatory Visit: Payer: Self-pay | Admitting: Certified Nurse Midwife

## 2021-01-30 ENCOUNTER — Telehealth: Payer: Self-pay | Admitting: Certified Nurse Midwife

## 2021-01-30 NOTE — Telephone Encounter (Signed)
Sara Howell called in and states she would like to know the results of her pelvic ultrasound.

## 2021-04-18 ENCOUNTER — Encounter: Payer: BC Managed Care – PPO | Admitting: Certified Nurse Midwife

## 2021-04-23 DIAGNOSIS — Y92009 Unspecified place in unspecified non-institutional (private) residence as the place of occurrence of the external cause: Secondary | ICD-10-CM | POA: Diagnosis not present

## 2021-04-23 DIAGNOSIS — M799 Soft tissue disorder, unspecified: Secondary | ICD-10-CM | POA: Diagnosis not present

## 2021-04-23 DIAGNOSIS — S0990XA Unspecified injury of head, initial encounter: Secondary | ICD-10-CM | POA: Diagnosis not present

## 2021-04-23 DIAGNOSIS — M545 Low back pain, unspecified: Secondary | ICD-10-CM | POA: Diagnosis not present

## 2021-04-23 DIAGNOSIS — Z91018 Allergy to other foods: Secondary | ICD-10-CM | POA: Diagnosis not present

## 2021-04-23 DIAGNOSIS — S32009A Unspecified fracture of unspecified lumbar vertebra, initial encounter for closed fracture: Secondary | ICD-10-CM | POA: Diagnosis not present

## 2021-04-23 DIAGNOSIS — S59911A Unspecified injury of right forearm, initial encounter: Secondary | ICD-10-CM | POA: Diagnosis not present

## 2021-04-23 DIAGNOSIS — S3992XA Unspecified injury of lower back, initial encounter: Secondary | ICD-10-CM | POA: Diagnosis not present

## 2021-04-23 DIAGNOSIS — S20221A Contusion of right back wall of thorax, initial encounter: Secondary | ICD-10-CM | POA: Diagnosis not present

## 2021-04-23 DIAGNOSIS — Z91010 Allergy to peanuts: Secondary | ICD-10-CM | POA: Diagnosis not present

## 2021-04-23 DIAGNOSIS — S322XXA Fracture of coccyx, initial encounter for closed fracture: Secondary | ICD-10-CM | POA: Diagnosis not present

## 2021-04-23 DIAGNOSIS — W109XXA Fall (on) (from) unspecified stairs and steps, initial encounter: Secondary | ICD-10-CM | POA: Diagnosis not present

## 2021-04-23 DIAGNOSIS — M47816 Spondylosis without myelopathy or radiculopathy, lumbar region: Secondary | ICD-10-CM | POA: Diagnosis not present

## 2021-04-24 DIAGNOSIS — M47816 Spondylosis without myelopathy or radiculopathy, lumbar region: Secondary | ICD-10-CM | POA: Diagnosis not present

## 2021-04-24 DIAGNOSIS — S59911A Unspecified injury of right forearm, initial encounter: Secondary | ICD-10-CM | POA: Diagnosis not present

## 2021-04-24 DIAGNOSIS — S32009A Unspecified fracture of unspecified lumbar vertebra, initial encounter for closed fracture: Secondary | ICD-10-CM | POA: Diagnosis not present

## 2021-04-24 DIAGNOSIS — S322XXA Fracture of coccyx, initial encounter for closed fracture: Secondary | ICD-10-CM | POA: Diagnosis not present

## 2021-04-24 DIAGNOSIS — M799 Soft tissue disorder, unspecified: Secondary | ICD-10-CM | POA: Diagnosis not present

## 2021-04-24 DIAGNOSIS — S20221A Contusion of right back wall of thorax, initial encounter: Secondary | ICD-10-CM | POA: Diagnosis not present

## 2021-04-24 DIAGNOSIS — S3992XA Unspecified injury of lower back, initial encounter: Secondary | ICD-10-CM | POA: Diagnosis not present

## 2021-04-24 DIAGNOSIS — S0990XA Unspecified injury of head, initial encounter: Secondary | ICD-10-CM | POA: Diagnosis not present

## 2021-05-14 ENCOUNTER — Ambulatory Visit: Payer: BC Managed Care – PPO | Admitting: Internal Medicine

## 2021-05-14 ENCOUNTER — Other Ambulatory Visit: Payer: Self-pay

## 2021-05-14 ENCOUNTER — Encounter: Payer: Self-pay | Admitting: Internal Medicine

## 2021-05-14 DIAGNOSIS — S322XXG Fracture of coccyx, subsequent encounter for fracture with delayed healing: Secondary | ICD-10-CM | POA: Diagnosis not present

## 2021-05-14 DIAGNOSIS — S322XXA Fracture of coccyx, initial encounter for closed fracture: Secondary | ICD-10-CM | POA: Insufficient documentation

## 2021-05-14 NOTE — Progress Notes (Signed)
Subjective:    Patient ID: Sara Howell, female    DOB: December 11, 1975, 46 y.o.   MRN: [id number]  HPI Here for ER follow up after a fall  Walked out to garage to get a soda in fridge there---12/26 Holding plate in 1 hand (with sandwich) Doesn't remember what happened but slipped ( in ribber [place]) Hit back of head---hematoma right occiput, bruising right leg Bruised along right back also  Seen at [facility] ER Coccyx fracture and cortical abnormality in L3--unclear if fracture Having constant soreness in L3 area Concerned about recurrence of neurologic pain----has sense "I just want to crack" my back Feels she is having "zinging" pain by coccyx  Had some nausea---given sublingual zofran Trying to avoid the oxycodone Using tylenol 1000/ibuprofen 200 together  Heat is helping Ice not helping now  Walking okay---but hips hurt when walking around for a while Going to neurosurgeon on Wednesday  Current Outpatient Medications on File Prior to Visit  Medication Sig Dispense Refill   levothyroxine (SYNTHROID) 75 MCG tablet TAKE 1 TABLET BY MOUTH EVERY DAY 90 tablet 3   omeprazole (PRILOSEC) 20 MG capsule TAKE 1 CAPSULE BY MOUTH EVERY DAY 90 capsule 3   Semaglutide, 2 MG/DOSE, 8 MG/3ML SOPN Inject 2 mg as directed once a week. 3 mL 11   No current facility-administered medications on file prior to visit.    Allergies  Allergen Reactions   Corn-Containing Products    Eggs Or Egg-Derived Products     Large amounts causes tightness in chest   Other     Honey Chest tightness   Peanut-Containing Drug Products     Past Medical History:  Diagnosis Date   Anal fissure    Anemia    Anxiety    Depression    Hashimoto's thyroiditis 03/10/2017   Hypothyroidism 10/12   post partum only, no further need for meds   PONV (postoperative nausea and vomiting)    PVC (premature ventricular contraction)    no meds no cardiologist routinely    Past Surgical History:  Procedure Laterality  Date   CERVICAL DISCECTOMY  2007   No specified location   GANGLION CYST EXCISION Left 2011   LAPAROSCOPY  09/12/2011   Procedure: LAPAROSCOPY OPERATIVE;  Surgeon: Marylynn Pearson, MD;  Location: [facility];  Service: Gynecology;  Laterality: Right;  right oophorectomy   MUSCLE BIOPSY  as a teenager   non specific; biopsy   NSVD     x 2   WISDOM TOOTH EXTRACTION      Family History  Problem Relation Age of Onset   Hepatitis Father        Hep C   Hypertension Father    Drug abuse Father    Alcohol abuse Father    Anxiety disorder Father    Hypothyroidism Sister    Crohn's disease Brother    Diabetes Paternal Grandfather    Leukemia Paternal Grandfather    Seizures Maternal Aunt        Epilepsy   Seizures Maternal Uncle        Epilepsy   Liver cancer Maternal Uncle    Depression Maternal Grandmother    Anxiety disorder Maternal Grandmother    Anxiety disorder Daughter     Social History   Socioeconomic History   Marital status: Married    Spouse name: jimmy   Number of children: 3   Years of education: Not on file   Highest education level: High school graduate  Occupational History  Occupation: Tree surgeon    Comment: [employer]  Tobacco Use   Smoking status: Never   Smokeless tobacco: Never  Vaping Use   Vaping Use: Never used  Substance and Sexual Activity   Alcohol use: Not Currently    Comment: Rarely   Drug use: No   Sexual activity: Yes    Birth control/protection: None    Comment: husband vasectomy  Other Topics Concern   Not on file  Social History Narrative   Pt gets regular exercise with cardio 2 days a week and 3 classes a week.   Diet consists of fruits and veggies, doesn't like meat much and likes soda and sweet tea.   Social Determinants of Health   Financial Resource Strain: Not on file  Food Insecurity: Not on file  Transportation Needs: Not on file  Physical Activity: Not on file  Stress: Not on file  Social Connections: Not  on file  Intimate Partner Violence: Not on file   Review of Systems Missed one day of work only ---still works from home No loss of bowel or bladder control Some constipation when taking oxycodone     Objective:   Physical Exam Constitutional:      Appearance: Normal appearance.  Musculoskeletal:     Comments: Tenderness over coccyx and ~L3 Some tenderness over posterior left hip as well Passive ROM is normal in hips SLR negative  Neurological:     Mental Status: She is alert.     Comments: Mildly antalgic gait No leg weakness           Assessment & Plan:

## 2021-05-14 NOTE — Assessment & Plan Note (Signed)
Ongoing pain--but doesn't seem to be neurologic Cortical irregularity in L3---??minor fracture  Discussed increasing the ibuprofen to 600mg  tid (with food) Tylenol 1000 mg tid as well  Going to see neurosurge---let her know that I don't think she needs MRI with current symptoms

## 2021-05-14 NOTE — Patient Instructions (Signed)
Try 3 ibuprofen (200mg  ) with each meal.  You can take tylenol 1000mg  three times a day as well Continue the omeprazole

## 2021-05-16 DIAGNOSIS — S32009A Unspecified fracture of unspecified lumbar vertebra, initial encounter for closed fracture: Secondary | ICD-10-CM | POA: Diagnosis not present

## 2021-05-16 DIAGNOSIS — S3210XD Unspecified fracture of sacrum, subsequent encounter for fracture with routine healing: Secondary | ICD-10-CM | POA: Diagnosis not present

## 2021-05-16 DIAGNOSIS — M5416 Radiculopathy, lumbar region: Secondary | ICD-10-CM | POA: Diagnosis not present

## 2021-05-16 DIAGNOSIS — S322XXD Fracture of coccyx, subsequent encounter for fracture with routine healing: Secondary | ICD-10-CM | POA: Diagnosis not present

## 2021-05-21 DIAGNOSIS — M5416 Radiculopathy, lumbar region: Secondary | ICD-10-CM | POA: Diagnosis not present

## 2021-05-24 DIAGNOSIS — M5416 Radiculopathy, lumbar region: Secondary | ICD-10-CM | POA: Diagnosis not present

## 2021-05-28 DIAGNOSIS — M5416 Radiculopathy, lumbar region: Secondary | ICD-10-CM | POA: Diagnosis not present

## 2021-06-05 DIAGNOSIS — M5416 Radiculopathy, lumbar region: Secondary | ICD-10-CM | POA: Diagnosis not present

## 2021-06-15 DIAGNOSIS — M5416 Radiculopathy, lumbar region: Secondary | ICD-10-CM | POA: Diagnosis not present

## 2021-06-19 DIAGNOSIS — M5416 Radiculopathy, lumbar region: Secondary | ICD-10-CM | POA: Diagnosis not present

## 2021-06-26 DIAGNOSIS — S3210XA Unspecified fracture of sacrum, initial encounter for closed fracture: Secondary | ICD-10-CM | POA: Diagnosis not present

## 2021-06-26 DIAGNOSIS — S322XXD Fracture of coccyx, subsequent encounter for fracture with routine healing: Secondary | ICD-10-CM | POA: Diagnosis not present

## 2021-06-26 DIAGNOSIS — S322XXA Fracture of coccyx, initial encounter for closed fracture: Secondary | ICD-10-CM | POA: Diagnosis not present

## 2021-06-26 DIAGNOSIS — S3210XD Unspecified fracture of sacrum, subsequent encounter for fracture with routine healing: Secondary | ICD-10-CM | POA: Diagnosis not present

## 2021-06-27 ENCOUNTER — Ambulatory Visit: Payer: BC Managed Care – PPO | Admitting: Family

## 2021-06-27 ENCOUNTER — Other Ambulatory Visit: Payer: Self-pay

## 2021-06-27 ENCOUNTER — Encounter: Payer: Self-pay | Admitting: Family

## 2021-06-27 VITALS — BP 116/68 | HR 80 | Temp 98.5°F | Ht 64.0 in | Wt 167.0 lb

## 2021-06-27 DIAGNOSIS — J029 Acute pharyngitis, unspecified: Secondary | ICD-10-CM | POA: Insufficient documentation

## 2021-06-27 DIAGNOSIS — R509 Fever, unspecified: Secondary | ICD-10-CM | POA: Diagnosis not present

## 2021-06-27 DIAGNOSIS — B3731 Acute candidiasis of vulva and vagina: Secondary | ICD-10-CM | POA: Insufficient documentation

## 2021-06-27 DIAGNOSIS — J0110 Acute frontal sinusitis, unspecified: Secondary | ICD-10-CM | POA: Diagnosis not present

## 2021-06-27 LAB — POC INFLUENZA A&B (BINAX/QUICKVUE): Influenza A, POC: NEGATIVE

## 2021-06-27 LAB — POCT RAPID STREP A (OFFICE): Rapid Strep A Screen: NEGATIVE

## 2021-06-27 MED ORDER — AMOXICILLIN-POT CLAVULANATE 875-125 MG PO TABS
1.0000 | ORAL_TABLET | Freq: Two times a day (BID) | ORAL | 0 refills | Status: DC
Start: 2021-06-27 — End: 2021-11-27

## 2021-06-27 MED ORDER — FLUCONAZOLE 150 MG PO TABS: 150.0000 mg | ORAL_TABLET | Freq: Once | ORAL | 0 refills | Status: AC

## 2021-06-27 NOTE — Progress Notes (Signed)
? ?Established Patient Office Visit ? ?Subjective:  ?Patient ID: Sara Howell, female    DOB: 13-Apr-1976  Age: 46 y.o. MRN: [id number] ? ?CC:  ?Chief Complaint  ?Patient presents with  ? Nasal Congestion  ?  Body ache, fever reading,99-101, congested, eye socket pain--3 days  ? ? ?HPI ?Sara Howell is here today with concerns.  ? ?Three days c/o right sided sinus tenderness with behind the eye tenderness. Nasal congestion, slightly improved. Decreased appetite, noticed Monday when went to bed mild sob. Has had issues with allergies, took a benadryl which helped her. Does report fever up to 101 F .  ? ?Taking tylenol motrin for control of fever.  ?No longer with sob.  ?Cough is dry nonproductive.  ?No blurry vision or vision change.  ? ?Past Medical History:  ?Diagnosis Date  ? Anal fissure   ? Anemia   ? Anxiety   ? Depression   ? Hashimoto's thyroiditis 03/10/2017  ? Hypothyroidism 10/12  ? post partum only, no further need for meds  ? PONV (postoperative nausea and vomiting)   ? PVC (premature ventricular contraction)   ? no meds no cardiologist routinely  ? ? ?Past Surgical History:  ?Procedure Laterality Date  ? CERVICAL DISCECTOMY  2007  ? No specified location  ? GANGLION CYST EXCISION Left 2011  ? LAPAROSCOPY  09/12/2011  ? Procedure: LAPAROSCOPY OPERATIVE;  Surgeon: Marylynn Pearson, MD;  Location: [facility];  Service: Gynecology;  Laterality: Right;  right oophorectomy  ? MUSCLE BIOPSY  as a teenager  ? non specific; biopsy  ? NSVD    ? x 2  ? WISDOM TOOTH EXTRACTION    ? ? ?Family History  ?Problem Relation Age of Onset  ? Hepatitis Father   ?     Hep C  ? Hypertension Father   ? Drug abuse Father   ? Alcohol abuse Father   ? Anxiety disorder Father   ? Hypothyroidism Sister   ? Crohn's disease Brother   ? Diabetes Paternal Grandfather   ? Leukemia Paternal Grandfather   ? Seizures Maternal Aunt   ?     Epilepsy  ? Seizures Maternal Uncle   ?     Epilepsy  ? Liver cancer Maternal Uncle   ? Depression  Maternal Grandmother   ? Anxiety disorder Maternal Grandmother   ? Anxiety disorder Daughter   ? ? ?Social History  ? ?Socioeconomic History  ? Marital status: Married  ?  Spouse name: jimmy  ? Number of children: 3  ? Years of education: Not on file  ? Highest education level: High school graduate  ?Occupational History  ? Occupation: Tree surgeon  ?  Comment: [employer]  ?Tobacco Use  ? Smoking status: Never  ? Smokeless tobacco: Never  ?Vaping Use  ? Vaping Use: Never used  ?Substance and Sexual Activity  ? Alcohol use: Not Currently  ?  Comment: Rarely  ? Drug use: No  ? Sexual activity: Yes  ?  Birth control/protection: None  ?  Comment: husband vasectomy  ?Other Topics Concern  ? Not on file  ?Social History Narrative  ? Pt gets regular exercise with cardio 2 days a week and 3 classes a week.  ? Diet consists of fruits and veggies, doesn't like meat much and likes soda and sweet tea.  ? ?Social Determinants of Health  ? ?Financial Resource Strain: Not on file  ?Food Insecurity: Not on file  ?Transportation Needs: Not on file  ?  Physical Activity: Not on file  ?Stress: Not on file  ?Social Connections: Not on file  ?Intimate Partner Violence: Not on file  ? ? ?Outpatient Medications Prior to Visit  ?Medication Sig Dispense Refill  ? levothyroxine (SYNTHROID) 75 MCG tablet TAKE 1 TABLET BY MOUTH EVERY DAY 90 tablet 3  ? omeprazole (PRILOSEC) 20 MG capsule TAKE 1 CAPSULE BY MOUTH EVERY DAY 90 capsule 3  ? Semaglutide, 2 MG/DOSE, 8 MG/3ML SOPN Inject 2 mg as directed once a week. 3 mL 11  ? ?No facility-administered medications prior to visit.  ? ? ?Allergies  ?Allergen Reactions  ? Corn-Containing Products   ? Eggs Or Egg-Derived Products   ?  Large amounts causes tightness in chest  ? Other   ?  Honey Chest tightness  ? Peanut-Containing Drug Products   ? ? ?ROS ?Review of Systems  ?Constitutional:  Positive for chills and fever.  ?HENT:  Positive for congestion and sinus pressure. Negative for ear pain  and sore throat.   ?Respiratory:  Positive for shortness of breath. Negative for cough and wheezing.   ?Cardiovascular:  Negative for chest pain and palpitations.  ? ?  ?Objective:  ?  ?Physical Exam ?Vitals reviewed.  ?Constitutional:   ?   General: She is not in acute distress. ?   Appearance: Normal appearance. She is normal weight. She is not ill-appearing or toxic-appearing.  ?HENT:  ?   Head: Normocephalic.  ?   Right Ear: Tympanic membrane normal.  ?   Left Ear: Tympanic membrane normal.  ?   Nose:  ?   Right Sinus: Maxillary sinus tenderness and frontal sinus tenderness present.  ?   Left Sinus: Maxillary sinus tenderness and frontal sinus tenderness present.  ?   Mouth/Throat:  ?   Mouth: Mucous membranes are moist.  ?   Pharynx: Posterior oropharyngeal erythema present. No pharyngeal swelling or oropharyngeal exudate.  ?   Tonsils: No tonsillar exudate. 0 on the right. 0 on the left.  ?Eyes:  ?   Pupils: Pupils are equal, round, and reactive to light.  ?Cardiovascular:  ?   Rate and Rhythm: Normal rate and regular rhythm.  ?Pulmonary:  ?   Effort: Pulmonary effort is normal.  ?Neurological:  ?   Mental Status: She is alert.  ? ? ?BP 116/68   Pulse 80   Temp 98.5 ?F (36.9 ?C)   Ht 5\' 4"  (1.626 m)   Wt 167 lb (75.8 kg)   SpO2 98%   BMI 28.67 kg/m?  ?Wt Readings from Last 3 Encounters:  ?06/27/21 167 lb (75.8 kg)  ?05/14/21 171 lb (77.6 kg)  ?12/26/20 179 lb 3.2 oz (81.3 kg)  ? ? ? ?Health Maintenance Due  ?Topic Date Due  ? TETANUS/TDAP  06/28/2019  ? COLONOSCOPY (Pts 45-65yrs Insurance coverage will need to be confirmed)  Never done  ? ? ?There are no preventive care reminders to display for this patient. ? ?Lab Results  ?Component Value Date  ? TSH 3.31 11/01/2020  ? ?Lab Results  ?Component Value Date  ? WBC 4.2 11/01/2020  ? HGB 13.1 11/01/2020  ? HCT 38.3 11/01/2020  ? MCV 94.0 11/01/2020  ? PLT 225.0 11/01/2020  ? ?Lab Results  ?Component Value Date  ? NA 140 11/01/2020  ? K 4.0 11/01/2020  ? CO2  28 11/01/2020  ? GLUCOSE 76 11/01/2020  ? BUN 13 11/01/2020  ? CREATININE 0.76 11/01/2020  ? BILITOT 0.9 11/01/2020  ? ALKPHOS 49 11/01/2020  ?  AST 18 11/01/2020  ? ALT 14 11/01/2020  ? PROT 6.6 11/01/2020  ? ALBUMIN 4.3 11/01/2020  ? CALCIUM 9.0 11/01/2020  ? ANIONGAP 8 07/15/2017  ? GFR 94.39 11/01/2020  ? ?Lab Results  ?Component Value Date  ? HGBA1C 4.7 (L) 12/26/2016  ? ? ?  ?Assessment & Plan:  ? ?Problem List Items Addressed This Visit   ? ?  ? Respiratory  ? Acute non-recurrent frontal sinusitis  ?  Strep and flu negative in office.  ?covid rsv flu pending results.  ?Prescription given for augmentin 875/125 mg po bid for ten days. Pt to continue tylenol/ibuprofen prn sinus pain. Continue with humidifier prn and steam showers recommended as well. instructed If no symptom improvement in 48 hours please f/u ?Pt requested diflucan as always causes yeast, tolerates diflucan.  ?  ?  ? Relevant Medications  ? amoxicillin-clavulanate (AUGMENTIN) 875-125 MG tablet  ? fluconazole (DIFLUCAN) 150 MG tablet  ?  ? Genitourinary  ? Vaginal candida  ? Relevant Medications  ? fluconazole (DIFLUCAN) 150 MG tablet  ?  ? Other  ? Sore throat  ?  Strep today in office r/o ?  ?  ? Relevant Orders  ? POCT rapid strep A (Completed)  ? Fever - Primary  ?  Flu and covid test, pending ?  ?  ? Relevant Orders  ? COVID-19, Flu A+B and RSV  ? POC Influenza A&B(BINAX/QUICKVUE) (Completed)  ? ? ?Meds ordered this encounter  ?Medications  ? amoxicillin-clavulanate (AUGMENTIN) 875-125 MG tablet  ?  Sig: Take 1 tablet by mouth 2 (two) times daily.  ?  Dispense:  20 tablet  ?  Refill:  0  ?  Order Specific Question:   Supervising Provider  ?  Answer:   BEDSOLE, AMY E [[id number]]  ? fluconazole (DIFLUCAN) 150 MG tablet  ?  Sig: Take 1 tablet (150 mg total) by mouth once for 1 dose.  ?  Dispense:  1 tablet  ?  Refill:  0  ?  Pt tolerates well  ?  Order Specific Question:   Supervising Provider  ?  Answer:   BEDSOLE, AMY E [[id number]]  ? ? ?Follow-up:  Return if symptoms worsen or fail to improve.  ? ? ?Eugenia Pancoast, FNP ?

## 2021-06-27 NOTE — Assessment & Plan Note (Signed)
Flu and covid test, pending ?

## 2021-06-27 NOTE — Assessment & Plan Note (Signed)
Strep today in office r/o ?

## 2021-06-27 NOTE — Assessment & Plan Note (Signed)
Strep and flu negative in office.  ?covid rsv flu pending results.  ?Prescription given for augmentin 875/125 mg po bid for ten days. Pt to continue tylenol/ibuprofen prn sinus pain. Continue with humidifier prn and steam showers recommended as well. instructed If no symptom improvement in 48 hours please f/u ?Pt requested diflucan as always causes yeast, tolerates diflucan.  ?

## 2021-06-27 NOTE — Patient Instructions (Signed)
Start prescription of augmentin 875/125 mg and take as prescribed.  Tylenol/ibuprofen ok for sinus pain as needed Increase oral fluids. Ok to continue with humidifers and hot steamy showers as discussed during visit.  It was a pleasure speaking with you today, I hope you start feeling better soon.  Regards,   Havanna Groner  

## 2021-06-29 LAB — COVID-19, FLU A+B AND RSV
Influenza A, NAA: NOT DETECTED
Influenza B, NAA: NOT DETECTED
RSV, NAA: NOT DETECTED
SARS-CoV-2, NAA: NOT DETECTED

## 2021-07-11 ENCOUNTER — Encounter: Payer: Self-pay | Admitting: Certified Nurse Midwife

## 2021-07-11 ENCOUNTER — Ambulatory Visit (INDEPENDENT_AMBULATORY_CARE_PROVIDER_SITE_OTHER): Payer: BC Managed Care – PPO | Admitting: Certified Nurse Midwife

## 2021-07-11 ENCOUNTER — Other Ambulatory Visit: Payer: Self-pay | Admitting: Certified Nurse Midwife

## 2021-07-11 ENCOUNTER — Other Ambulatory Visit (HOSPITAL_COMMUNITY)
Admission: RE | Admit: 2021-07-11 | Discharge: 2021-07-11 | Disposition: A | Discharge status: Home / Self Care | Payer: BC Managed Care – PPO | Source: Ambulatory Visit | Attending: Certified Nurse Midwife | Admitting: Certified Nurse Midwife

## 2021-07-11 ENCOUNTER — Other Ambulatory Visit: Payer: Self-pay

## 2021-07-11 VITALS — BP 124/85 | HR 64 | Ht 65.0 in | Wt 170.4 lb

## 2021-07-11 DIAGNOSIS — Z1231 Encounter for screening mammogram for malignant neoplasm of breast: Secondary | ICD-10-CM

## 2021-07-11 DIAGNOSIS — Z8639 Personal history of other endocrine, nutritional and metabolic disease: Secondary | ICD-10-CM | POA: Diagnosis not present

## 2021-07-11 DIAGNOSIS — Z1211 Encounter for screening for malignant neoplasm of colon: Secondary | ICD-10-CM

## 2021-07-11 DIAGNOSIS — Z124 Encounter for screening for malignant neoplasm of cervix: Secondary | ICD-10-CM

## 2021-07-11 DIAGNOSIS — Z01419 Encounter for gynecological examination (general) (routine) without abnormal findings: Secondary | ICD-10-CM | POA: Diagnosis not present

## 2021-07-11 DIAGNOSIS — N6321 Unspecified lump in the left breast, upper outer quadrant: Secondary | ICD-10-CM

## 2021-07-11 DIAGNOSIS — N644 Mastodynia: Secondary | ICD-10-CM

## 2021-07-11 NOTE — Progress Notes (Signed)
? ? ?GYNECOLOGY ANNUAL PREVENTATIVE CARE ENCOUNTER NOTE ? ?History:    ? Sara Howell is a 46 y.o. [phone] female here for a routine annual gynecologic exam.  Current complaints: none.   Denies abnormal vaginal bleeding, discharge, pelvic pain, problems with intercourse or other gynecologic concerns.  ?  ? ?Social ?Relationship:married ?Living: spouse , son-11, 2 adult daughters ?Work: Freight forwarder ?Exercise: boot camp ?Smoke/Alcohol/drug use: occasional alcohol use. Denies drugs and smoking. ? ?Gynecologic History ?No LMP recorded (lmp unknown). ?Contraception: vasectomy ?Last Pap: 09/26/2015. Results were: normal with negative HPV ?Last mammogram: 2017. Results were: normal ? ?Obstetric History ?OB History  ?Gravida Para Term Preterm AB Living  ?'4 3 3   1 3  '$ ?SAB IAB Ectopic Multiple Live Births  ?        3  ?  ?# Outcome Date GA Lbr Len/2nd Weight Sex Delivery Anes PTL Lv  ?4 Term 06/27/09   8 lb 6 oz (3.799 kg) M Vag-Spont  N LIV  ?3 AB 2010 [redacted]w[redacted]d        ?2 Term 10/03/00   9 lb 6 oz (4.252 kg) F Vag-Spont  N LIV  ?1 Term 06/19/94   8 lb 3 oz (3.714 kg) F Vag-Spont   LIV  ? ? ?Past Medical History:  ?Diagnosis Date  ? Anal fissure   ? Anemia   ? Anxiety   ? Depression   ? Hashimoto's thyroiditis 03/10/2017  ? Hypothyroidism 10/12  ? post partum only, no further need for meds  ? PONV (postoperative nausea and vomiting)   ? PVC (premature ventricular contraction)   ? no meds no cardiologist routinely  ? ? ?Past Surgical History:  ?Procedure Laterality Date  ? CERVICAL DISCECTOMY  2007  ? No specified location  ? GANGLION CYST EXCISION Left 2011  ? LAPAROSCOPY  09/12/2011  ? Procedure: LAPAROSCOPY OPERATIVE;  Surgeon: GMarylynn Pearson MD;  [facility];  Service: Gynecology;  Laterality: Right;  right oophorectomy  ? MUSCLE BIOPSY  as a teenager  ? non specific; biopsy  ? NSVD    ? x 2  ? WISDOM TOOTH EXTRACTION    ? ? ?Current Outpatient Medications on File Prior to Visit  ?Medication Sig Dispense Refill  ?  amoxicillin-clavulanate (AUGMENTIN) 875-125 MG tablet Take 1 tablet by mouth 2 (two) times daily. 20 tablet 0  ? levothyroxine (SYNTHROID) 75 MCG tablet TAKE 1 TABLET BY MOUTH EVERY DAY 90 tablet 3  ? omeprazole (PRILOSEC) 20 MG capsule TAKE 1 CAPSULE BY MOUTH EVERY DAY 90 capsule 3  ? Semaglutide, 2 MG/DOSE, 8 MG/3ML SOPN Inject 2 mg as directed once a week. 3 mL 11  ? ?No current facility-administered medications on file prior to visit.  ? ? ?Allergies  ?Allergen Reactions  ? Corn-Containing Products   ? Eggs Or Egg-Derived Products   ?  Large amounts causes tightness in chest  ? Other   ?  Honey Chest tightness  ? Peanut-Containing Drug Products   ? ? ?Social History:  reports that she has never smoked. She has never used smokeless tobacco. She reports that she does not currently use alcohol. She reports that she does not use drugs. ? ?Family History  ?Problem Relation Age of Onset  ? Hepatitis Father   ?     Hep C  ? Hypertension Father   ? Drug abuse Father   ? Alcohol abuse Father   ? Anxiety disorder Father   ? Hypothyroidism Sister   ?  Crohn's disease Brother   ? Diabetes Paternal Grandfather   ? Leukemia Paternal Grandfather   ? Seizures Maternal Aunt   ?     Epilepsy  ? Seizures Maternal Uncle   ?     Epilepsy  ? Liver cancer Maternal Uncle   ? Depression Maternal Grandmother   ? Anxiety disorder Maternal Grandmother   ? Anxiety disorder Daughter   ? ? ?The following portions of the patient's history were reviewed and updated as appropriate: allergies, current medications, past family history, past medical history, past social history, past surgical history and problem list. ? ?Review of Systems ?Pertinent items noted in HPI and remainder of comprehensive ROS otherwise negative. ? ?Physical Exam:  ?BP 124/85   Pulse 64   Ht '5\' 5"'$  (1.651 m)   Wt 170 lb 6.4 oz (77.3 kg)   LMP  (LMP Unknown)   BMI 28.36 kg/m?  ?CONSTITUTIONAL: Well-developed, well-nourished female in no acute distress.  ?HENT:   Normocephalic, atraumatic, External right and left ear normal. Oropharynx is clear and moist ?EYES: Conjunctivae and EOM are normal. Pupils are equal, round, and reactive to light. No scleral icterus.  ?NECK: Normal range of motion, supple, no masses.  Normal thyroid.  ?SKIN: Skin is warm and dry. No rash noted. Not diaphoretic. No erythema. No pallor. ?MUSCULOSKELETAL: Normal range of motion. No tenderness.  No cyanosis, clubbing, or edema.  2+ distal pulses. ?NEUROLOGIC: Alert and oriented to person, place, and time. Normal reflexes, muscle tone coordination.  ?PSYCHIATRIC: Normal mood and affect. Normal behavior. Normal judgment and thought content. ?CARDIOVASCULAR: Normal heart rate noted, regular rhythm ?RESPIRATORY: Clear to auscultation bilaterally. Effort and breath sounds normal, no problems with respiration noted. ?BREASTS: Symmetric in size. No masses, tenderness, skin changes, nipple drainage, or lymphadenopathy bilaterally. Left breast upper outer quadrant enlarged tender lymph node.  ?ABDOMEN: Soft, no distention noted.  No tenderness, rebound or guarding.  ?PELVIC: Normal appearing external genitalia and urethral meatus; normal appearing vaginal mucosa and cervix.  No abnormal discharge noted.  Pap smear obtained. Contact bleeding  Normal uterine size, no other palpable masses, no uterine or adnexal tenderness.  . ?  ?Assessment and Plan:  ?  1. Well woman exam with routine gynecological exam ? ? ? ?Pap:Will follow up results of pap smear and manage accordingly. ?Mammogram : ordered ?Labs: Vitamin D, TSH panel  ?Refills: none ?Referral: Colonoscopy ordered ?Routine preventative health maintenance measures emphasized. ?Please refer to After Visit Summary for other counseling recommendations.  ?   ? ?Philip Aspen, CNM ?[facility] ?[facility]   ?

## 2021-07-12 LAB — THYROID PANEL WITH TSH
Free Thyroxine Index: 1.9 (ref 1.2–4.9)
T3 Uptake Ratio: 26 % (ref 24–39)
T4, Total: 7.2 ug/dL (ref 4.5–12.0)
TSH: 5.64 u[IU]/mL — ABNORMAL HIGH (ref 0.450–4.500)

## 2021-07-12 LAB — VITAMIN D 25 HYDROXY (VIT D DEFICIENCY, FRACTURES): Vit D, 25-Hydroxy: 27.7 ng/mL — ABNORMAL LOW (ref 30.0–100.0)

## 2021-07-13 ENCOUNTER — Encounter: Payer: Self-pay | Admitting: Certified Nurse Midwife

## 2021-07-16 LAB — CYTOLOGY - PAP
Comment: NEGATIVE
Diagnosis: UNDETERMINED — AB
High risk HPV: NEGATIVE

## 2021-07-17 ENCOUNTER — Encounter: Payer: Self-pay | Admitting: Certified Nurse Midwife

## 2021-07-27 ENCOUNTER — Ambulatory Visit
Admission: RE | Admit: 2021-07-27 | Discharge: 2021-07-27 | Disposition: A | Discharge status: Home / Self Care | Payer: BC Managed Care – PPO | Source: Ambulatory Visit | Attending: Certified Nurse Midwife | Admitting: Certified Nurse Midwife

## 2021-07-27 DIAGNOSIS — N6321 Unspecified lump in the left breast, upper outer quadrant: Secondary | ICD-10-CM | POA: Diagnosis not present

## 2021-07-27 DIAGNOSIS — R922 Inconclusive mammogram: Secondary | ICD-10-CM | POA: Diagnosis not present

## 2021-07-27 DIAGNOSIS — N644 Mastodynia: Secondary | ICD-10-CM | POA: Diagnosis not present

## 2021-07-29 ENCOUNTER — Encounter: Payer: Self-pay | Admitting: Certified Nurse Midwife

## 2021-08-20 ENCOUNTER — Other Ambulatory Visit: Payer: BC Managed Care – PPO

## 2021-11-05 ENCOUNTER — Encounter: Payer: BC Managed Care – PPO | Admitting: Internal Medicine

## 2021-11-22 ENCOUNTER — Encounter: Payer: Self-pay | Admitting: Internal Medicine

## 2021-11-27 ENCOUNTER — Ambulatory Visit: Payer: BC Managed Care – PPO | Admitting: Gastroenterology

## 2021-11-27 ENCOUNTER — Encounter: Payer: Self-pay | Admitting: Gastroenterology

## 2021-11-27 VITALS — BP 110/82 | HR 74 | Ht 65.0 in | Wt 173.0 lb

## 2021-11-27 DIAGNOSIS — K2100 Gastro-esophageal reflux disease with esophagitis, without bleeding: Secondary | ICD-10-CM | POA: Diagnosis not present

## 2021-11-27 DIAGNOSIS — R142 Eructation: Secondary | ICD-10-CM | POA: Diagnosis not present

## 2021-11-27 DIAGNOSIS — R110 Nausea: Secondary | ICD-10-CM | POA: Diagnosis not present

## 2021-11-27 DIAGNOSIS — Z1211 Encounter for screening for malignant neoplasm of colon: Secondary | ICD-10-CM | POA: Diagnosis not present

## 2021-11-27 MED ORDER — OMEPRAZOLE 40 MG PO CPDR: 40.0000 mg | DELAYED_RELEASE_CAPSULE | Freq: Every day | ORAL | 3 refills | Status: DC

## 2021-11-27 MED ORDER — NA SULFATE-K SULFATE-MG SULF 17.5-3.13-1.6 GM/177ML PO SOLN: 1.0000 | Freq: Once | ORAL | 0 refills | Status: AC

## 2021-11-27 MED ORDER — SEMAGLUTIDE (1 MG/DOSE) 4 MG/3ML ~~LOC~~ SOPN: 1.0000 mg | PEN_INJECTOR | SUBCUTANEOUS | 11 refills | Status: DC

## 2021-11-27 NOTE — Patient Instructions (Addendum)
If you are age 47 or younger, your body mass index should be between 19-25. Your Body mass index is 28.79 kg/m. If this is out of the aformentioned range listed, please consider follow up with your Primary Care Provider.   __________________________________________________________  The [facility] providers would like to encourage you to use [facility] to communicate with providers for non-urgent requests or questions.  Due to long hold times on the telephone, sending your provider a message by [facility] may be a faster and more efficient way to get a response.  Please allow 48 business hours for a response.  Please remember that this is for non-urgent requests.    Due to recent changes in healthcare laws, you may see the results of your imaging and laboratory studies on MyChart before your provider has had a chance to review them.  We understand that in some cases there may be results that are confusing or concerning to you. Not all laboratory results come back in the same time frame and the provider may be waiting for multiple results in order to interpret others.  Please give [place] 48 hours in order for your provider to thoroughly review all the results before contacting the office for clarification of your results.   We have sent the following medications to your pharmacy for you to pick up at your convenience: Suprep, Omeprazole 40 mg  You have been scheduled for an endoscopy and colonoscopy. Please follow the written instructions given to you at your visit today. Please pick up your prep supplies at the pharmacy within the next 1-3 days. If you use inhalers (even only as needed), please bring them with you on the day of your procedure.    Thank you for choosing me and [facility].  Vito Cirigliano, D.O.

## 2021-11-27 NOTE — Progress Notes (Signed)
Chief Complaint: Heartburn, GERD, colon cancer screening   Referring Provider:     Venia Carbon, MD     HPI:     Sara Howell is a 46 y.o. female with a history of anxiety/depression, Hashimoto's thyroiditis, hypothyroidism, cervical discectomy, referred to the Gastroenterology Clinic for evaluation of reflux symptoms along with evaluation for colon cancer screening.  Has had reflux sxs since ~2019 related to weight gain during dx of Hashimoto's and subsequent hypothyroidism. Index sxs of heartburn, regurgitation, nausea, dyspepsia, belching. +nocturnal sxs.  No dysphagia.  Worse with acidic foods. Previously trialed Zantac until recall, then started taking omeprazole 20 mg/day for the last couple years without much improvement.  Uses Tums x2 daily for breakthrough. Avoids eating within 3 hours of bedtime.   Separately, she is due for routine, average risk CRC screening.  No hematochezia, melena, change in bowel habits.  No known family history of colon cancer or GI malignancy.  Family history notable for brother with Crohn's Disease.  No previous EGD or colonoscopy.  May have had a flexible sigmoidoscopy years ago, but she isn't sure. No recent abdominal imaging for review.     Latest Ref Rng & Units 11/01/2020   12:30 PM 10/01/2019    9:45 AM 01/08/2019    9:39 AM  CBC  WBC 4.0 - 10.5 K/uL 4.2  4.3  4.2   Hemoglobin 12.0 - 15.0 g/dL 13.1  13.2  13.6   Hematocrit 36.0 - 46.0 % 38.3  38.5  39.0   Platelets 150.0 - 400.0 K/uL 225.0  211.0  246.0       Latest Ref Rng & Units 11/01/2020   12:30 PM 10/01/2019    9:45 AM 01/08/2019    9:39 AM  CMP  Glucose 70 - 99 mg/dL 76  91  92   BUN 6 - 23 mg/dL '13  11  12   '$ Creatinine 0.40 - 1.20 mg/dL 0.76  0.74  0.85   Sodium 135 - 145 mEq/L 140  140  142   Potassium 3.5 - 5.1 mEq/L 4.0  4.3  4.0   Chloride 96 - 112 mEq/L 107  109  108   CO2 19 - 32 mEq/L '28  27  28   '$ Calcium 8.4 - 10.5 mg/dL 9.0  8.8  9.1   Total Protein  6.0 - 8.3 g/dL 6.6  6.1  6.3   Total Bilirubin 0.2 - 1.2 mg/dL 0.9  0.7  0.8   Alkaline Phos 39 - 117 U/L 49  57  54   AST 0 - 37 U/L '18  25  15   '$ ALT 0 - 35 U/L '14  26  11       '$ Past Medical History:  Diagnosis Date   Anal fissure    Anemia    Anxiety    Depression    Hashimoto's thyroiditis 03/10/2017   Hypothyroidism 01/2011   post partum only, no further need for meds   PONV (postoperative nausea and vomiting)    PVC (premature ventricular contraction)    no meds no cardiologist routinely     Past Surgical History:  Procedure Laterality Date   CERVICAL DISCECTOMY  2007   No specified location   GANGLION CYST EXCISION Left 2011   LAPAROSCOPY  09/12/2011   Procedure: LAPAROSCOPY OPERATIVE;  Surgeon: Marylynn Pearson, MD;  Location: [facility];  Service: Gynecology;  Laterality: Right;  right oophorectomy  MUSCLE BIOPSY  as a teenager   non specific; biopsy   NSVD     x 2   WISDOM TOOTH EXTRACTION     Family History  Problem Relation Age of Onset   Hepatitis Father        Hep C   Hypertension Father    Drug abuse Father    Alcohol abuse Father    Anxiety disorder Father    Hypothyroidism Sister    Crohn's disease Brother    Diabetes Paternal Grandfather    Leukemia Paternal Grandfather    Seizures Maternal Aunt        Epilepsy   Seizures Maternal Uncle        Epilepsy   Liver cancer Maternal Uncle    Depression Maternal Grandmother    Anxiety disorder Maternal Grandmother    Anxiety disorder Daughter    Social History   Tobacco Use   Smoking status: Never   Smokeless tobacco: Never  Vaping Use   Vaping Use: Never used  Substance Use Topics   Alcohol use: Not Currently    Comment: Rarely   Drug use: No   Current Outpatient Medications  Medication Sig Dispense Refill   levothyroxine (SYNTHROID) 75 MCG tablet TAKE 1 TABLET BY MOUTH EVERY DAY 90 tablet 3   omeprazole (PRILOSEC) 20 MG capsule TAKE 1 CAPSULE BY MOUTH EVERY DAY 90 capsule 3    Semaglutide, 2 MG/DOSE, 8 MG/3ML SOPN Inject 2 mg as directed once a week. 3 mL 11   No current facility-administered medications for this visit.   Allergies  Allergen Reactions   Corn-Containing Products    Eggs Or Egg-Derived Products     Large amounts causes tightness in chest   Other     Honey Chest tightness   Peanut-Containing Drug Products      Review of Systems: All systems reviewed and negative except where noted in HPI.     Physical Exam:    Wt Readings from Last 3 Encounters:  11/27/21 173 lb (78.5 kg)  07/11/21 170 lb 6.4 oz (77.3 kg)  06/27/21 167 lb (75.8 kg)    Ht '5\' 5"'$  (1.651 m)   Wt 173 lb (78.5 kg)   BMI 28.79 kg/m  Constitutional:  Pleasant, in no acute distress. Psychiatric: Normal mood and affect. Behavior is normal. Cardiovascular: Normal rate, regular rhythm. No edema Pulmonary/chest: Effort normal and breath sounds normal. No wheezing, rales or rhonchi. Abdominal: Soft, nondistended, nontender. Bowel sounds active throughout. There are no masses palpable. No hepatomegaly. Neurological: Alert and oriented to person place and time. Skin: Skin is warm and dry. No rashes noted.   ASSESSMENT AND PLAN;   1) Colon cancer screening - Schedule colonoscopy for routine, average risk CRC screening  2) GERD 3) Nausea 4) Belching - Increase Prilosec to 40 mg day and take 30-60 mins prior to dinner (needs to split from synthroid AM dosing) - EGD to evaluate for erosive esophagitis, LES laxity, hiatal hernia - Continue antireflux lifestyle/dietary modifications with avoidance of exacerbating foods and avoid eating close to bedtime  The indications, risks, and benefits of EGD and colonoscopy were explained to the patient in detail. Risks include but are not limited to bleeding, perforation, adverse reaction to medications, and cardiopulmonary compromise. Sequelae include but are not limited to the possibility of surgery, hospitalization, and mortality. The  patient verbalized understanding and wished to proceed. All questions answered, referred to scheduler and bowel prep ordered. Further recommendations pending results of the exam.  Lavena Bullion, DO, FACG  11/27/2021, 8:31 AM   Venia Carbon, MD

## 2021-12-02 ENCOUNTER — Encounter: Payer: Self-pay | Admitting: Certified Registered Nurse Anesthetist

## 2021-12-03 ENCOUNTER — Encounter: Payer: Self-pay | Admitting: Gastroenterology

## 2021-12-03 ENCOUNTER — Ambulatory Visit (AMBULATORY_SURGERY_CENTER): Payer: BC Managed Care – PPO | Admitting: Gastroenterology

## 2021-12-03 VITALS — BP 124/76 | HR 73 | Temp 98.4°F | Resp 13 | Ht 65.0 in | Wt 173.0 lb

## 2021-12-03 DIAGNOSIS — D131 Benign neoplasm of stomach: Secondary | ICD-10-CM | POA: Diagnosis not present

## 2021-12-03 DIAGNOSIS — K5730 Diverticulosis of large intestine without perforation or abscess without bleeding: Secondary | ICD-10-CM

## 2021-12-03 DIAGNOSIS — R140 Abdominal distension (gaseous): Secondary | ICD-10-CM

## 2021-12-03 DIAGNOSIS — Z1211 Encounter for screening for malignant neoplasm of colon: Secondary | ICD-10-CM | POA: Diagnosis not present

## 2021-12-03 DIAGNOSIS — K621 Rectal polyp: Secondary | ICD-10-CM

## 2021-12-03 DIAGNOSIS — R12 Heartburn: Secondary | ICD-10-CM | POA: Diagnosis not present

## 2021-12-03 DIAGNOSIS — D128 Benign neoplasm of rectum: Secondary | ICD-10-CM

## 2021-12-03 DIAGNOSIS — K635 Polyp of colon: Secondary | ICD-10-CM | POA: Diagnosis not present

## 2021-12-03 DIAGNOSIS — K219 Gastro-esophageal reflux disease without esophagitis: Secondary | ICD-10-CM

## 2021-12-03 DIAGNOSIS — D122 Benign neoplasm of ascending colon: Secondary | ICD-10-CM

## 2021-12-03 DIAGNOSIS — K317 Polyp of stomach and duodenum: Secondary | ICD-10-CM | POA: Diagnosis not present

## 2021-12-03 MED ORDER — SODIUM CHLORIDE 0.9 % IV SOLN: 500.0000 mL | Freq: Once | INTRAVENOUS | Status: DC

## 2021-12-03 NOTE — Progress Notes (Signed)
1429 Robinul 0.1 mg IV given due large amount of secretions upon assessment.  MD made aware, vss

## 2021-12-03 NOTE — Op Note (Signed)
[facility] Patient Name: Sara Howell Procedure Date: 12/03/2021 2:29 PM MRN: [id number] Endoscopist: Gerrit Heck , MD Age: 46 Referring MD:  Date of Birth: 04-18-1976 Gender: Female Account #: [id number] Procedure:                Upper GI endoscopy Indications:              Heartburn, Suspected esophageal reflux, Eructation,                            Nausea Medicines:                Monitored Anesthesia Care Procedure:                Pre-Anesthesia Assessment:                           - Prior to the procedure, a History and Physical                            was performed, and patient medications and                            allergies were reviewed. The patient's tolerance of                            previous anesthesia was also reviewed. The risks                            and benefits of the procedure and the sedation                            options and risks were discussed with the patient.                            All questions were answered, and informed consent                            was obtained. Prior Anticoagulants: The patient has                            taken no previous anticoagulant or antiplatelet                            agents. ASA Grade Assessment: II - A patient with                            mild systemic disease. After reviewing the risks                            and benefits, the patient was deemed in                            satisfactory condition to undergo the procedure.  After obtaining informed consent, the endoscope was                            passed under direct vision. Throughout the                            procedure, the patient's blood pressure, pulse, and                            oxygen saturations were monitored continuously. The                            Endoscope was introduced through the mouth, and                            advanced to the second part of duodenum. The  upper                            GI endoscopy was accomplished without difficulty.                            The patient tolerated the procedure well. Scope In: Scope Out: Findings:                 The examined esophagus was normal.                           The Z-line was regular and was found 39 cm from the                            incisors.                           The gastroesophageal flap valve was visualized                            endoscopically and classified as Hill Grade III                            (minimal fold, loose to endoscope, hiatal hernia                            likely).                           Multiple small sessile polyps with no bleeding and                            no stigmata of recent bleeding were found in the                            gastric fundus and in the gastric body. These                            polyps were removed with a  cold biopsy forceps.                            Resection and retrieval were complete. Estimated                            blood loss was minimal.                           The examined duodenum was normal. Complications:            No immediate complications. Estimated Blood Loss:     Estimated blood loss was minimal. Impression:               - Normal esophagus.                           - Z-line regular, 39 cm from the incisors.                           - Gastroesophageal flap valve classified as Hill                            Grade III (minimal fold, loose to endoscope, hiatal                            hernia likely).                           - Multiple gastric polyps. Resected and retrieved.                           - Normal examined duodenum. Recommendation:           - Patient has a contact number available for                            emergencies. The signs and symptoms of potential                            delayed complications were discussed with the                            patient. Return  to normal activities tomorrow.                            Written discharge instructions were provided to the                            patient.                           - Resume previous diet.                           - Continue present medications.                           -  Await pathology results.                           - Colonoscopy today. Gerrit Heck, MD 12/03/2021 3:21:10 PM

## 2021-12-03 NOTE — Patient Instructions (Signed)
Handouts on polyps and diverticulosis given to you today    YOU HAD AN ENDOSCOPIC PROCEDURE TODAY AT [facility]:   Refer to the procedure report that was given to you for any specific questions about what was found during the examination.  If the procedure report does not answer your questions, please call your gastroenterologist to clarify.  If you requested that your care partner not be given the details of your procedure findings, then the procedure report has been included in a sealed envelope for you to review at your convenience later.  YOU SHOULD EXPECT: Some feelings of bloating in the abdomen. Passage of more gas than usual.  Walking can help get rid of the air that was put into your GI tract during the procedure and reduce the bloating. If you had a lower endoscopy (such as a colonoscopy or flexible sigmoidoscopy) you may notice spotting of blood in your stool or on the toilet paper. If you underwent a bowel prep for your procedure, you may not have a normal bowel movement for a few days.  Please Note:  You might notice some irritation and congestion in your nose or some drainage.  This is from the oxygen used during your procedure.  There is no need for concern and it should clear up in a day or so.  SYMPTOMS TO REPORT IMMEDIATELY:  Following lower endoscopy (colonoscopy or flexible sigmoidoscopy):  Excessive amounts of blood in the stool  Significant tenderness or worsening of abdominal pains  Swelling of the abdomen that is new, acute  Fever of 100F or higher  Following upper endoscopy (EGD)  Vomiting of blood or coffee ground material  New chest pain or pain under the shoulder blades  Painful or persistently difficult swallowing  New shortness of breath  Fever of 100F or higher  Black, tarry-looking stools  For urgent or emergent issues, a gastroenterologist can be reached at any hour by calling [phone]. Do not use MyChart messaging for urgent  concerns.    DIET:  We do recommend a small meal at first, but then you may proceed to your regular diet.  Drink plenty of fluids but you should avoid alcoholic beverages for 24 hours.  ACTIVITY:  You should plan to take it easy for the rest of today and you should NOT DRIVE or use heavy machinery until tomorrow (because of the sedation medicines used during the test).    FOLLOW UP: Our staff will call the number listed on your records the next business day following your procedure.  We will call around 7:15- 8:00 am to check on you and address any questions or concerns that you may have regarding the information given to you following your procedure. If we do not reach you, we will leave a message.  If you develop any symptoms (ie: fever, flu-like symptoms, shortness of breath, cough etc.) before then, please call [phone].  If you test positive for Covid 19 in the 2 weeks post procedure, please call and report this information to [place].    If any biopsies were taken you will be contacted by phone or by letter within the next 1-3 weeks.  Please call us at [phone] if you have not heard about the biopsies in 3 weeks.    SIGNATURES/CONFIDENTIALITY: You and/or your care partner have signed paperwork which will be entered into your electronic medical record.  These signatures attest to the fact that that the information above on your After Visit Summary  has been reviewed and is understood.  Full responsibility of the confidentiality of this discharge information lies with you and/or your care-partner.  

## 2021-12-03 NOTE — Progress Notes (Signed)
Called to room to assist during endoscopic procedure.  Patient ID and intended procedure confirmed with present staff. Received instructions for my participation in the procedure from the performing physician.  

## 2021-12-03 NOTE — Op Note (Signed)
[facility] Patient Name: Sara Howell Procedure Date: 12/03/2021 2:28 PM MRN: [id number] Endoscopist: Gerrit Heck , MD Age: 46 Referring MD:  Date of Birth: November 12, 1975 Gender: Female Account #: [id number] Procedure:                Colonoscopy Indications:              Screening for colorectal malignant neoplasm, This                            is the patient's first colonoscopy Medicines:                Monitored Anesthesia Care Procedure:                Pre-Anesthesia Assessment:                           - Prior to the procedure, a History and Physical                            was performed, and patient medications and                            allergies were reviewed. The patient's tolerance of                            previous anesthesia was also reviewed. The risks                            and benefits of the procedure and the sedation                            options and risks were discussed with the patient.                            All questions were answered, and informed consent                            was obtained. Prior Anticoagulants: The patient has                            taken no previous anticoagulant or antiplatelet                            agents. ASA Grade Assessment: II - A patient with                            mild systemic disease. After reviewing the risks                            and benefits, the patient was deemed in                            satisfactory condition to undergo the procedure.  After obtaining informed consent, the colonoscope                            was passed under direct vision. Throughout the                            procedure, the patient's blood pressure, pulse, and                            oxygen saturations were monitored continuously. The                            Olympus CF-HQ190L ([id number]) Colonoscope was                            introduced through the anus and  advanced to the the                            terminal ileum. The colonoscopy was performed                            without difficulty. The patient tolerated the                            procedure well. The quality of the bowel                            preparation was good. The terminal ileum, ileocecal                            valve, appendiceal orifice, and rectum were                            photographed. Scope In: 2:58:51 PM Scope Out: 3:15:50 PM Scope Withdrawal Time: 0 hours 13 minutes 26 seconds  Total Procedure Duration: 0 hours 16 minutes 59 seconds  Findings:                 The perianal and digital rectal examinations were                            normal.                           A 6 mm polyp was found in the ascending colon. The                            polyp was sessile. The polyp was removed with a                            cold snare. Resection and retrieval were complete.                            Estimated blood loss was minimal.  A 3 mm polyp was found in the rectum. The polyp was                            sessile. The polyp was removed with a cold snare.                            Resection and retrieval were complete. Estimated                            blood loss was minimal.                           Multiple small and large-mouthed diverticula were                            found in the sigmoid colon, descending colon and                            cecum.                           The retroflexed view of the distal rectum and anal                            verge was normal and showed no anal or rectal                            abnormalities.                           The terminal ileum appeared normal. Complications:            No immediate complications. Estimated Blood Loss:     Estimated blood loss was minimal. Impression:               - One 6 mm polyp in the ascending colon, removed                             with a cold snare. Resected and retrieved.                           - One 3 mm polyp in the rectum, removed with a cold                            snare. Resected and retrieved.                           - Diverticulosis in the sigmoid colon, in the                            descending colon and in the cecum.                           - The distal rectum and anal verge are normal on  retroflexion view.                           - The examined portion of the ileum was normal. Recommendation:           - Patient has a contact number available for                            emergencies. The signs and symptoms of potential                            delayed complications were discussed with the                            patient. Return to normal activities tomorrow.                            Written discharge instructions were provided to the                            patient.                           - Resume previous diet.                           - Continue present medications.                           - Await pathology results.                           - Repeat colonoscopy for surveillance based on                            pathology results.                           - Return to GI office PRN. Gerrit Heck, MD 12/03/2021 3:23:47 PM

## 2021-12-03 NOTE — Progress Notes (Signed)
GASTROENTEROLOGY PROCEDURE H&P NOTE   Primary Care Physician: Venia Carbon, MD    Reason for Procedure:   GERD, heartburn. CRC screening  Plan:    EGD, Colonoscopy  Patient is appropriate for endoscopic procedure(s) in the ambulatory ([place]) setting.  The nature of the procedure, as well as the risks, benefits, and alternatives were carefully and thoroughly reviewed with the patient. Ample time for discussion and questions allowed. The patient understood, was satisfied, and agreed to proceed.     HPI: Sara Howell is a 46 y.o. female who presents for EGD for evaluation of GERD and heartburn and colonoscopy for index CRC screening .  Patient was most recently seen in the Gastroenterology Clinic on 11/27/2021 by me.  No interval change in medical history since that appointment. Please refer to that note for full details regarding GI history and clinical presentation.   Past Medical History:  Diagnosis Date   Anal fissure    Anemia    Anxiety    Depression    Hashimoto's thyroiditis 03/10/2017   Hypothyroidism 01/2011   post partum only, no further need for meds   PONV (postoperative nausea and vomiting)    PVC (premature ventricular contraction)    no meds no cardiologist routinely    Past Surgical History:  Procedure Laterality Date   CERVICAL DISCECTOMY  2007   No specified location   GANGLION CYST EXCISION Left 2011   LAPAROSCOPY  09/12/2011   Procedure: LAPAROSCOPY OPERATIVE;  Surgeon: Marylynn Pearson, MD;  Location: [facility];  Service: Gynecology;  Laterality: Right;  right oophorectomy   MUSCLE BIOPSY  as a teenager   non specific; biopsy   NSVD     x 2   WISDOM TOOTH EXTRACTION      Prior to Admission medications   Medication Sig Start Date End Date Taking? Authorizing Provider  COLLAGEN PO Take 30 mLs by mouth daily.   Yes [provider]  levothyroxine (SYNTHROID) 75 MCG tablet TAKE 1 TABLET BY MOUTH EVERY DAY 12/12/20  Yes Venia Carbon, MD   Multiple Vitamin (MULTIVITAMIN) tablet Take 1 tablet by mouth daily.   Yes [provider]  omeprazole (PRILOSEC) 40 MG capsule Take 1 capsule (40 mg total) by mouth daily. Take 1 capsule (40 mg total ) 30 to 60 minutes before dinner 11/27/21  Yes Kourtney Montesinos V, DO  Vitamin D, Ergocalciferol, (DRISDOL) 1.25 MG (50000 UNIT) CAPS capsule Take 50,000 Units by mouth every 7 (seven) days.   Yes [provider]  Semaglutide, 1 MG/DOSE, 4 MG/3ML SOPN Inject 1 mg as directed once a week. 11/27/21   Venia Carbon, MD    Current Outpatient Medications  Medication Sig Dispense Refill   COLLAGEN PO Take 30 mLs by mouth daily.     levothyroxine (SYNTHROID) 75 MCG tablet TAKE 1 TABLET BY MOUTH EVERY DAY 90 tablet 3   Multiple Vitamin (MULTIVITAMIN) tablet Take 1 tablet by mouth daily.     omeprazole (PRILOSEC) 40 MG capsule Take 1 capsule (40 mg total) by mouth daily. Take 1 capsule (40 mg total ) 30 to 60 minutes before dinner 90 capsule 3   Vitamin D, Ergocalciferol, (DRISDOL) 1.25 MG (50000 UNIT) CAPS capsule Take 50,000 Units by mouth every 7 (seven) days.     Semaglutide, 1 MG/DOSE, 4 MG/3ML SOPN Inject 1 mg as directed once a week. 3 mL 11   Current Facility-Administered Medications  Medication Dose Route Frequency Provider Last Rate Last Admin  0.9 %  sodium chloride infusion  500 mL Intravenous Once Alvey Brockel V, DO        Allergies as of 12/03/2021 - Review Complete 12/03/2021  Allergen Reaction Noted   Corn-containing products     Eggs or egg-derived products     Other  05/21/2018   Peanut-containing drug products      Family History  Problem Relation Age of Onset   Hepatitis Father        Hep C   Hypertension Father    Drug abuse Father    Alcohol abuse Father    Anxiety disorder Father    Hypothyroidism Sister    Crohn's disease Brother    Depression Maternal Grandmother    Anxiety disorder Maternal Grandmother    Diabetes Paternal Grandfather     Leukemia Paternal Grandfather    Anxiety disorder Daughter    Seizures Maternal Aunt        Epilepsy   Seizures Maternal Uncle        Epilepsy   Liver cancer Maternal Uncle    Irritable bowel syndrome Child    Irritable bowel syndrome Child    Colon cancer Neg Hx     Social History   Socioeconomic History   Marital status: Married    Spouse name: jimmy   Number of children: 3   Years of education: Not on file   Highest education level: High school graduate  Occupational History   Occupation: Tree surgeon    Comment: [employer]  Tobacco Use   Smoking status: Never   Smokeless tobacco: Never  Vaping Use   Vaping Use: Never used  Substance and Sexual Activity   Alcohol use: Not Currently    Comment: Rarely   Drug use: No   Sexual activity: Yes    Birth control/protection: None    Comment: husband vasectomy  Other Topics Concern   Not on file  Social History Narrative   Pt gets regular exercise with cardio 2 days a week and 3 classes a week.   Diet consists of fruits and veggies, doesn't like meat much and likes soda and sweet tea.   Social Determinants of Health   Financial Resource Strain: Low Risk  (07/24/2017)   Overall Financial Resource Strain (CARDIA)    Difficulty of Paying Living Expenses: Not hard at all  Food Insecurity: No Food Insecurity (07/24/2017)   Hunger Vital Sign    Worried About Running Out of Food in the Last Year: Never true    Ran Out of Food in the Last Year: Never true  Transportation Needs: No Transportation Needs (07/24/2017)   PRAPARE - Hydrologist (Medical): No    Lack of Transportation (Non-Medical): No  Physical Activity: Inactive (07/24/2017)   Exercise Vital Sign    Days of Exercise per Week: 0 days    Minutes of Exercise per Session: 0 min  Stress: No Stress Concern Present (07/24/2017)   [place]    Feeling of Stress : Only a  little  Social Connections: Moderately Isolated (07/24/2017)   Social Connection and Isolation Panel [NHANES]    Frequency of Communication with Friends and Family: Once a week    Frequency of Social Gatherings with Friends and Family: Never    Attends Religious Services: Never    Marine scientist or Organizations: No    Attends Archivist Meetings: Never    Marital Status: Married  Intimate Partner Violence: Not At Risk (07/24/2017)   Humiliation, Afraid, Rape, and Kick questionnaire    Fear of Current or Ex-Partner: No    Emotionally Abused: No    Physically Abused: No    Sexually Abused: No    Physical Exam: Vital signs in last 24 hours: '@BP'$  134/85   Pulse 75   Temp 98.4 F (36.9 C) (Temporal)   Resp 12   Ht '5\' 5"'$  (1.651 m)   Wt 173 lb (78.5 kg)   SpO2 100%   BMI 28.79 kg/m  GEN: NAD EYE: Sclerae anicteric ENT: MMM CV: Non-tachycardic Pulm: CTA b/l GI: Soft, NT/ND NEURO:  Alert & Oriented x 3   Gerrit Heck, DO [facility]   12/03/2021 2:33 PM

## 2021-12-03 NOTE — Progress Notes (Signed)
Report given to PACU, vss 

## 2021-12-04 ENCOUNTER — Telehealth: Payer: Self-pay

## 2021-12-04 NOTE — Telephone Encounter (Signed)
No answer on follow up call. No VM.

## 2021-12-11 ENCOUNTER — Encounter: Payer: Self-pay | Admitting: Gastroenterology

## 2022-02-07 ENCOUNTER — Other Ambulatory Visit: Payer: Self-pay | Admitting: Internal Medicine

## 2022-03-07 ENCOUNTER — Encounter: Payer: BC Managed Care – PPO | Admitting: Internal Medicine

## 2022-03-29 ENCOUNTER — Other Ambulatory Visit (HOSPITAL_COMMUNITY): Payer: Self-pay

## 2022-03-29 ENCOUNTER — Encounter: Payer: Self-pay | Admitting: Internal Medicine

## 2022-03-29 ENCOUNTER — Ambulatory Visit (INDEPENDENT_AMBULATORY_CARE_PROVIDER_SITE_OTHER): Payer: BC Managed Care – PPO | Admitting: Internal Medicine

## 2022-03-29 ENCOUNTER — Telehealth: Payer: Self-pay

## 2022-03-29 VITALS — BP 116/70 | HR 68 | Temp 98.0°F | Ht 64.0 in | Wt 171.0 lb

## 2022-03-29 DIAGNOSIS — K219 Gastro-esophageal reflux disease without esophagitis: Secondary | ICD-10-CM

## 2022-03-29 DIAGNOSIS — Z0000 Encounter for general adult medical examination without abnormal findings: Secondary | ICD-10-CM | POA: Diagnosis not present

## 2022-03-29 DIAGNOSIS — E039 Hypothyroidism, unspecified: Secondary | ICD-10-CM | POA: Diagnosis not present

## 2022-03-29 MED ORDER — SEMAGLUTIDE-WEIGHT MANAGEMENT 2.4 MG/0.75ML ~~LOC~~ SOAJ: 2.4000 mg | SUBCUTANEOUS | 11 refills | Status: DC

## 2022-03-29 NOTE — Telephone Encounter (Signed)
Pharmacy Patient Advocate Encounter   Received notification from [facility] that prior authorization for [facility] 2.'4mg'$ /0.70m is required/requested.  PA submitted on 03/29/22 [facility] [id number]  Status is pending

## 2022-03-29 NOTE — Assessment & Plan Note (Signed)
Mostly controlled with omeprazole '40mg'$  daily

## 2022-03-29 NOTE — Assessment & Plan Note (Signed)
Healthy Does exercise regularly Recommended Td--she wants to defer with upcoming travel No flu vaccine--allergic Prefers no COVID vaccine Colon due again 2028 Pap per gyn Recent mammogram

## 2022-03-29 NOTE — Progress Notes (Signed)
Subjective:    Patient ID: Sara Howell, female    DOB: Dec 31, 1975, 46 y.o.   MRN: [id number]  HPI Here for physical  Doing well Has lost some weight with the ozempic Feels it has stabilized but her shape is better Exercises regularly--may have increased muscle mass with trainer work Careful with eating  Has noticed decreased libido Discussed--not related to semaglutide  Current Outpatient Medications on File Prior to Visit  Medication Sig Dispense Refill   levothyroxine (SYNTHROID) 75 MCG tablet TAKE 1 TABLET BY MOUTH DAILY 90 tablet 3   Multiple Vitamin (MULTIVITAMIN) tablet Take 1 tablet by mouth daily.     omeprazole (PRILOSEC) 40 MG capsule Take 1 capsule (40 mg total) by mouth daily. Take 1 capsule (40 mg total ) 30 to 60 minutes before dinner 90 capsule 3   Semaglutide, 2 MG/DOSE, (OZEMPIC, 2 MG/DOSE,) 8 MG/3ML SOPN INJECT '2MG'$  AS DIRECTED ONCE A WEEK 3 mL 11   Vitamin D, Ergocalciferol, (DRISDOL) 1.25 MG (50000 UNIT) CAPS capsule Take 50,000 Units by mouth every 7 (seven) days.     No current facility-administered medications on file prior to visit.    Allergies  Allergen Reactions   Corn-Containing Products    Eggs Or Egg-Derived Products     Large amounts causes tightness in chest   Other     Honey Chest tightness   Peanut-Containing Drug Products     Past Medical History:  Diagnosis Date   Anal fissure    Anemia    Anxiety    Depression    Hashimoto's thyroiditis 03/10/2017   Hypothyroidism 01/2011   post partum only, no further need for meds   PONV (postoperative nausea and vomiting)    PVC (premature ventricular contraction)    no meds no cardiologist routinely    Past Surgical History:  Procedure Laterality Date   CERVICAL DISCECTOMY  2007   No specified location   GANGLION CYST EXCISION Left 2011   LAPAROSCOPY  09/12/2011   Procedure: LAPAROSCOPY OPERATIVE;  Surgeon: Marylynn Pearson, MD;  Location: [facility];  Service: Gynecology;  Laterality:  Right;  right oophorectomy   MUSCLE BIOPSY  as a teenager   non specific; biopsy   NSVD     x 2   WISDOM TOOTH EXTRACTION      Family History  Problem Relation Age of Onset   Hepatitis Father        Hep C   Hypertension Father    Drug abuse Father    Alcohol abuse Father    Anxiety disorder Father    Hypothyroidism Sister    Crohn's disease Brother    Depression Maternal Grandmother    Anxiety disorder Maternal Grandmother    Diabetes Paternal Grandfather    Leukemia Paternal Grandfather    Anxiety disorder Daughter    Seizures Maternal Aunt        Epilepsy   Seizures Maternal Uncle        Epilepsy   Liver cancer Maternal Uncle    Irritable bowel syndrome Child    Irritable bowel syndrome Child    Colon cancer Neg Hx     Social History   Socioeconomic History   Marital status: Married    Spouse name: jimmy   Number of children: 3   Years of education: Not on file   Highest education level: High school graduate  Occupational History   Occupation: Tree surgeon    Comment: Aluminum conduits  Tobacco Use   Smoking  status: Never    Passive exposure: Past   Smokeless tobacco: Never  Vaping Use   Vaping Use: Never used  Substance and Sexual Activity   Alcohol use: Not Currently    Comment: Rarely   Drug use: No   Sexual activity: Yes    Birth control/protection: None    Comment: husband vasectomy  Other Topics Concern   Not on file  Social History Narrative   Pt gets regular exercise with cardio 2 days a week and 3 classes a week.   Diet consists of fruits and veggies, doesn't like meat much and likes soda and sweet tea.   Social Determinants of Health   Financial Resource Strain: Low Risk  (07/24/2017)   Overall Financial Resource Strain (CARDIA)    Difficulty of Paying Living Expenses: Not hard at all  Food Insecurity: No Food Insecurity (07/24/2017)   Hunger Vital Sign    Worried About Running Out of Food in the Last Year: Never true    Ran Out of  Food in the Last Year: Never true  Transportation Needs: No Transportation Needs (07/24/2017)   PRAPARE - Hydrologist (Medical): No    Lack of Transportation (Non-Medical): No  Physical Activity: Inactive (07/24/2017)   Exercise Vital Sign    Days of Exercise per Week: 0 days    Minutes of Exercise per Session: 0 min  Stress: No Stress Concern Present (07/24/2017)   [place]    Feeling of Stress : Only a little  Social Connections: Moderately Isolated (07/24/2017)   Social Connection and Isolation Panel [NHANES]    Frequency of Communication with Friends and Family: Once a week    Frequency of Social Gatherings with Friends and Family: Never    Attends Religious Services: Never    Marine scientist or Organizations: No    Attends Archivist Meetings: Never    Marital Status: Married  Human resources officer Violence: Not At Risk (07/24/2017)   Humiliation, Afraid, Rape, and Kick questionnaire    Fear of Current or Ex-Partner: No    Emotionally Abused: No    Physically Abused: No    Sexually Abused: No   Review of Systems  Constitutional:  Negative for fatigue.       Wears seat belt  HENT:  Negative for dental problem, tinnitus and trouble swallowing.        Some comprehension problems --?hearing loss Keeps up with dentist  Eyes:  Negative for visual disturbance.       No diplopia or unilateral vision loss  Respiratory:  Negative for chest tightness and shortness of breath.        Slight cough--?change of weather  Cardiovascular:  Positive for palpitations. Negative for chest pain and leg swelling.  Gastrointestinal:  Negative for constipation.       Some reflux symptoms---mostly controlled Rare blood on TP  Endocrine: Positive for polydipsia. Negative for polyuria.  Genitourinary:  Negative for difficulty urinating and hematuria.       Periods are regular Chronic dyspareunia   Musculoskeletal:        Some left shoulder pain Chronic back issues--related to past surgery  Skin:  Negative for rash.  Allergic/Immunologic: Positive for environmental allergies. Negative for immunocompromised state.       Occ OTC meds  Neurological:  Positive for dizziness. Negative for syncope.       Has to be careful getting up too fast  Occasional headaches---if sick or with menses  Hematological:  Negative for adenopathy. Bruises/bleeds easily.  Psychiatric/Behavioral:  Negative for dysphoric mood and sleep disturbance. The patient is nervous/anxious.        Objective:   Physical Exam Constitutional:      Appearance: Normal appearance.  HENT:     Mouth/Throat:     Pharynx: No oropharyngeal exudate or posterior oropharyngeal erythema.  Eyes:     Conjunctiva/sclera: Conjunctivae normal.     Pupils: Pupils are equal, round, and reactive to light.  Cardiovascular:     Rate and Rhythm: Normal rate and regular rhythm.     Pulses: Normal pulses.     Heart sounds: No murmur heard.    No gallop.  Pulmonary:     Effort: Pulmonary effort is normal.     Breath sounds: Normal breath sounds. No wheezing or rales.  Abdominal:     Palpations: Abdomen is soft.     Tenderness: There is no abdominal tenderness.  Musculoskeletal:     Cervical back: Neck supple.     Right lower leg: No edema.     Left lower leg: No edema.  Lymphadenopathy:     Cervical: No cervical adenopathy.  Skin:    Findings: No lesion or rash.  Neurological:     General: No focal deficit present.     Mental Status: She is alert and oriented to person, place, and time.  Psychiatric:        Mood and Affect: Mood normal.        Behavior: Behavior normal.            Assessment & Plan:

## 2022-03-29 NOTE — Assessment & Plan Note (Signed)
Has done well with semaglutide Will change to wegovy 2.'4mg'$  weekly (as long as insurance covers)

## 2022-03-29 NOTE — Assessment & Plan Note (Signed)
Labs fine in May (TSH just borderline high) No change for now

## 2022-04-02 ENCOUNTER — Other Ambulatory Visit (HOSPITAL_COMMUNITY): Payer: Self-pay

## 2022-04-02 NOTE — Telephone Encounter (Signed)
Pharmacy Patient Advocate Encounter  Received notification from [facility] that the request for prior authorization for Wegovy 2.'4MG'$ /0.75ML has been denied due to  .

## 2022-04-17 ENCOUNTER — Ambulatory Visit: Payer: BC Managed Care – PPO | Admitting: Primary Care

## 2022-04-17 ENCOUNTER — Encounter: Payer: Self-pay | Admitting: Primary Care

## 2022-04-17 VITALS — BP 138/86 | HR 77 | Temp 98.8°F | Ht 64.0 in | Wt 169.0 lb

## 2022-04-17 DIAGNOSIS — J069 Acute upper respiratory infection, unspecified: Secondary | ICD-10-CM

## 2022-04-17 DIAGNOSIS — H6121 Impacted cerumen, right ear: Secondary | ICD-10-CM

## 2022-04-17 DIAGNOSIS — H6120 Impacted cerumen, unspecified ear: Secondary | ICD-10-CM | POA: Insufficient documentation

## 2022-04-17 LAB — POCT RAPID STREP A (OFFICE): Rapid Strep A Screen: NEGATIVE

## 2022-04-17 LAB — POCT INFLUENZA A/B
Influenza A, POC: NEGATIVE
Influenza B, POC: NEGATIVE

## 2022-04-17 LAB — POC COVID19 BINAXNOW: SARS Coronavirus 2 Ag: NEGATIVE

## 2022-04-17 MED ORDER — PREDNISONE 20 MG PO TABS: ORAL_TABLET | ORAL | 0 refills | Status: DC

## 2022-04-17 NOTE — Patient Instructions (Addendum)
Start prednisone 20 mg tablets. Take 2 tablets by mouth once daily in the morning for 5 days.  You can try using Flonase (fluticasone) nasal spray first. Instill 1 spray in each nostril twice daily.   Please call us if no improvement in symptoms on Tuesday next week.  Try Debrox drops for your ear wax.   It was a pleasure meeting you!

## 2022-04-17 NOTE — Addendum Note (Signed)
Addended by: Pat Kocher on: 04/17/2022 03:52 PM   Modules accepted: Orders

## 2022-04-17 NOTE — Assessment & Plan Note (Signed)
Suspect viral etiology at this point.  Rapid strep, flu, and Covid-19 tests negative in the office today.  Discussed options for treatment, she opts for prednisone course.   Start prednisone 20 mg tablets. Take 2 tablets by mouth once daily in the morning for 5 days.  Return precautions provided.

## 2022-04-17 NOTE — Assessment & Plan Note (Addendum)
Right cerumen impaction identified on exam. Patient consented to irrigation of canals bilaterally. Attempted right ear canal irrigation, patient could not tolerate.  No abnormality known after irrigation. Unable to visualize TM post attempt.   Discussed use of Debrox drops.

## 2022-04-17 NOTE — Progress Notes (Signed)
Subjective:    Patient ID: Sara Howell, female    DOB: 1975-12-06, 46 y.o.   MRN: [id number]  Sore Throat  Associated symptoms include congestion, coughing, ear pain and headaches. Pertinent negatives include no shortness of breath.    Sara Howell is a very pleasant 46 y.o. female patient of Dr. Silvio Pate with a history of hypothyroidism, GERD who presents today to discuss sore throat.  Symptom onset two days ago with sore throat. She then developed ear pain, ear fullness, headache, mild cough, sneezing, "acid and burning" sensation in her throat. .   She took two home Covid-19 tests, one yesterday and one today, both negative. She was exposed to influenza, Covid-19 infection, and strep pharyngitis over the last few weeks. She travels often for work, was on two planes recently.   She's been taking throat lozenges, Advil Dual Action.   BP Readings from Last 3 Encounters:  04/17/22 138/86  03/29/22 116/70  12/03/21 124/76      Review of Systems  HENT:  Positive for congestion, ear pain and sore throat.   Respiratory:  Positive for cough. Negative for shortness of breath.   Neurological:  Positive for headaches.         Past Medical History:  Diagnosis Date   Anal fissure    Anemia    Anxiety    Depression    Hashimoto's thyroiditis 03/10/2017   Hypothyroidism 01/2011   post partum only, no further need for meds   PONV (postoperative nausea and vomiting)    PVC (premature ventricular contraction)    no meds no cardiologist routinely    Social History   Socioeconomic History   Marital status: Married    Spouse name: jimmy   Number of children: 3   Years of education: Not on file   Highest education level: High school graduate  Occupational History   Occupation: Tree surgeon    Comment: [employer]  Tobacco Use   Smoking status: Never    Passive exposure: Past   Smokeless tobacco: Never  Vaping Use   Vaping Use: Never used  Substance and Sexual  Activity   Alcohol use: Not Currently    Comment: Rarely   Drug use: No   Sexual activity: Yes    Birth control/protection: None    Comment: husband vasectomy  Other Topics Concern   Not on file  Social History Narrative   Pt gets regular exercise with cardio 2 days a week and 3 classes a week.   Diet consists of fruits and veggies, doesn't like meat much and likes soda and sweet tea.   Social Determinants of Health   Financial Resource Strain: Low Risk  (07/24/2017)   Overall Financial Resource Strain (CARDIA)    Difficulty of Paying Living Expenses: Not hard at all  Food Insecurity: No Food Insecurity (07/24/2017)   Hunger Vital Sign    Worried About Running Out of Food in the Last Year: Never true    Ran Out of Food in the Last Year: Never true  Transportation Needs: No Transportation Needs (07/24/2017)   PRAPARE - Hydrologist (Medical): No    Lack of Transportation (Non-Medical): No  Physical Activity: Inactive (07/24/2017)   Exercise Vital Sign    Days of Exercise per Week: 0 days    Minutes of Exercise per Session: 0 min  Stress: No Stress Concern Present (07/24/2017)   [place]  Feeling of Stress : Only a little  Social Connections: Moderately Isolated (07/24/2017)   Social Connection and Isolation Panel [NHANES]    Frequency of Communication with Friends and Family: Once a week    Frequency of Social Gatherings with Friends and Family: Never    Attends Religious Services: Never    Marine scientist or Organizations: No    Attends Archivist Meetings: Never    Marital Status: Married  Human resources officer Violence: Not At Risk (07/24/2017)   Humiliation, Afraid, Rape, and Kick questionnaire    Fear of Current or Ex-Partner: No    Emotionally Abused: No    Physically Abused: No    Sexually Abused: No    Past Surgical History:  Procedure Laterality Date    CERVICAL DISCECTOMY  2007   No specified location   GANGLION CYST EXCISION Left 2011   LAPAROSCOPY  09/12/2011   Procedure: LAPAROSCOPY OPERATIVE;  Surgeon: Marylynn Pearson, MD;  Location: [facility];  Service: Gynecology;  Laterality: Right;  right oophorectomy   MUSCLE BIOPSY  as a teenager   non specific; biopsy   NSVD     x 2   WISDOM TOOTH EXTRACTION      Family History  Problem Relation Age of Onset   Hepatitis Father        Hep C   Hypertension Father    Drug abuse Father    Alcohol abuse Father    Anxiety disorder Father    Hypothyroidism Sister    Crohn's disease Brother    Depression Maternal Grandmother    Anxiety disorder Maternal Grandmother    Diabetes Paternal Grandfather    Leukemia Paternal Grandfather    Anxiety disorder Daughter    Seizures Maternal Aunt        Epilepsy   Seizures Maternal Uncle        Epilepsy   Liver cancer Maternal Uncle    Irritable bowel syndrome Child    Irritable bowel syndrome Child    Colon cancer Neg Hx     Allergies  Allergen Reactions   Corn-Containing Products    Eggs Or Egg-Derived Products     Large amounts causes tightness in chest   Other     Honey Chest tightness   Peanut-Containing Drug Products     Current Outpatient Medications on File Prior to Visit  Medication Sig Dispense Refill   levothyroxine (SYNTHROID) 75 MCG tablet TAKE 1 TABLET BY MOUTH DAILY 90 tablet 3   Multiple Vitamin (MULTIVITAMIN) tablet Take 1 tablet by mouth daily.     omeprazole (PRILOSEC) 40 MG capsule Take 1 capsule (40 mg total) by mouth daily. Take 1 capsule (40 mg total ) 30 to 60 minutes before dinner 90 capsule 3   Semaglutide-Weight Management 2.4 MG/0.75ML SOAJ Inject 2.4 mg into the skin once a week. 3 mL 11   Vitamin D, Ergocalciferol, (DRISDOL) 1.25 MG (50000 UNIT) CAPS capsule Take 50,000 Units by mouth every 7 (seven) days.     No current facility-administered medications on file prior to visit.    BP 138/86   Pulse 77    Temp 98.8 F (37.1 C) (Temporal)   Ht '5\' 4"'$  (1.626 m)   Wt 169 lb (76.7 kg)   SpO2 98%   BMI 29.01 kg/m  Objective:   Physical Exam Constitutional:      Appearance: She is ill-appearing.  HENT:     Right Ear: Tympanic membrane and ear canal normal. There is impacted cerumen.  Left Ear: Tympanic membrane and ear canal normal.     Nose:     Right Sinus: No maxillary sinus tenderness or frontal sinus tenderness.     Left Sinus: No maxillary sinus tenderness or frontal sinus tenderness.     Mouth/Throat:     Mouth: Mucous membranes are moist.     Pharynx: Posterior oropharyngeal erythema present. No oropharyngeal exudate.     Tonsils: No tonsillar exudate. 1+ on the right. 1+ on the left.  Eyes:     Conjunctiva/sclera: Conjunctivae normal.  Cardiovascular:     Rate and Rhythm: Normal rate and regular rhythm.  Pulmonary:     Effort: Pulmonary effort is normal.     Breath sounds: Normal breath sounds. No wheezing or rales.  Musculoskeletal:     Cervical back: Neck supple.  Lymphadenopathy:     Cervical: No cervical adenopathy.  Skin:    General: Skin is warm and dry.           Assessment & Plan:   Problem List Items Addressed This Visit       Respiratory   Viral URI with cough - Primary    Suspect viral etiology at this point.  Rapid strep, flu, and Covid-19 tests negative in the office today.  Discussed options for treatment, she opts for prednisone course.   Start prednisone 20 mg tablets. Take 2 tablets by mouth once daily in the morning for 5 days.  Return precautions provided.       Relevant Medications   predniSONE (DELTASONE) 20 MG tablet     Nervous and Auditory   Cerumen impaction    Right cerumen impaction identified on exam. Patient consented to irrigation of canals bilaterally. Attempted right ear canal irrigation, patient could not tolerate.  No abnormality known after irrigation. Unable to visualize TM post attempt.   Discussed use of  Debrox drops.           Pleas Koch, NP

## 2022-05-12 ENCOUNTER — Telehealth: Payer: BC Managed Care – PPO | Admitting: Family Medicine

## 2022-05-12 ENCOUNTER — Ambulatory Visit
Admission: EM | Admit: 2022-05-12 | Discharge: 2022-05-12 | Disposition: A | Discharge status: Home / Self Care | Payer: BC Managed Care – PPO | Attending: Emergency Medicine | Admitting: Emergency Medicine

## 2022-05-12 DIAGNOSIS — L03213 Periorbital cellulitis: Secondary | ICD-10-CM | POA: Diagnosis not present

## 2022-05-12 MED ORDER — AMOXICILLIN-POT CLAVULANATE 875-125 MG PO TABS: 1.0000 | ORAL_TABLET | Freq: Two times a day (BID) | ORAL | 0 refills | Status: AC

## 2022-05-12 NOTE — ED Provider Notes (Signed)
[name]    CSN: [id number] Arrival date & time: 05/12/22  6948      History   Chief Complaint Chief Complaint  Patient presents with   Eye Problem    I just had a virtual visit and they referred me to urgent care for my swelling,  irritation and itchiness under my eye - Entered by patient    HPI Sara Howell is a 47 y.o. female.  Patient presents with 2 day history of redness and swelling of the left lower eyelid.  Today right lower eyelid is mildly red also.  No eye injury, eye drainage, eye pain, change in vision, fever, chills, sore throat, cough, or other symptoms.  No treatment at home.  Patient had an e-visit today; diagnosed with periorbital cellulitis of left eye and instructed to be seen in person.  Her medical history includes GERD, Hashimoto's thyroiditis, hypothyroidism, anxiety, depression, obesity.   The history is provided by the patient and medical records.    Past Medical History:  Diagnosis Date   Anal fissure    Anemia    Anxiety    Depression    Hashimoto's thyroiditis 03/10/2017   Hypothyroidism 01/2011   post partum only, no further need for meds   PONV (postoperative nausea and vomiting)    PVC (premature ventricular contraction)    no meds no cardiologist routinely    Patient Active Problem List   Diagnosis Date Noted   Cerumen impaction 04/17/2022   Vaginal candida 06/27/2021   Closed fracture of coccyx ([place]) 05/14/2021   GERD (gastroesophageal reflux disease) 05/21/2018   Shoulder joint pain 07/24/2017   Hashimoto's thyroiditis 03/10/2017   Viral URI with cough 06/16/2012   Preventative health care 01/30/2011   Hypothyroidism 05/24/2010   Obesity 05/24/2010   HEMORRHOIDS, INTERNAL 03/08/2008    Past Surgical History:  Procedure Laterality Date   CERVICAL DISCECTOMY  2007   No specified location   GANGLION CYST EXCISION Left 2011   LAPAROSCOPY  09/12/2011   Procedure: LAPAROSCOPY OPERATIVE;  Surgeon: Marylynn Pearson,  MD;  Location: [facility];  Service: Gynecology;  Laterality: Right;  right oophorectomy   MUSCLE BIOPSY  as a teenager   non specific; biopsy   NSVD     x 2   WISDOM TOOTH EXTRACTION      OB History     Gravida  4   Para  3   Term  3   Preterm      AB  1   Living  3      SAB      IAB      Ectopic      Multiple      Live Births  3            Home Medications    Prior to Admission medications   Medication Sig Start Date End Date Taking? Authorizing Provider  amoxicillin-clavulanate (AUGMENTIN) 875-125 MG tablet Take 1 tablet by mouth every 12 (twelve) hours for 10 days. 05/12/22 05/22/22 Yes Sharion Balloon, NP  levothyroxine (SYNTHROID) 75 MCG tablet TAKE 1 TABLET BY MOUTH DAILY 02/11/22   Venia Carbon, MD  Multiple Vitamin (MULTIVITAMIN) tablet Take 1 tablet by mouth daily.    [provider]  omeprazole (PRILOSEC) 40 MG capsule Take 1 capsule (40 mg total) by mouth daily. Take 1 capsule (40 mg total ) 30 to 60 minutes before dinner 11/27/21   Cirigliano, Vito V, DO  predniSONE (DELTASONE) 20 MG  tablet Take 2 tablets by mouth once daily in the morning for 5 days. 04/17/22   Pleas Koch, NP  Semaglutide-Weight Management 2.4 MG/0.75ML SOAJ Inject 2.4 mg into the skin once a week. 03/29/22   Venia Carbon, MD  Vitamin D, Ergocalciferol, (DRISDOL) 1.25 MG (50000 UNIT) CAPS capsule Take 50,000 Units by mouth every 7 (seven) days.    [provider]    Family History Family History  Problem Relation Age of Onset   Hepatitis Father        Hep C   Hypertension Father    Drug abuse Father    Alcohol abuse Father    Anxiety disorder Father    Hypothyroidism Sister    Crohn's disease Brother    Depression Maternal Grandmother    Anxiety disorder Maternal Grandmother    Diabetes Paternal Grandfather    Leukemia Paternal Grandfather    Anxiety disorder Daughter    Seizures Maternal Aunt        Epilepsy   Seizures Maternal Uncle         Epilepsy   Liver cancer Maternal Uncle    Irritable bowel syndrome Child    Irritable bowel syndrome Child    Colon cancer Neg Hx     Social History Social History   Tobacco Use   Smoking status: Never    Passive exposure: Past   Smokeless tobacco: Never  Vaping Use   Vaping Use: Never used  Substance Use Topics   Alcohol use: Not Currently    Comment: Rarely   Drug use: No     Allergies   Corn-containing products, Eggs or egg-derived products, Other, and Peanut-containing drug products   Review of Systems Review of Systems  Constitutional:  Negative for chills and fever.  HENT:  Negative for ear pain and sore throat.   Eyes:  Positive for redness. Negative for pain, discharge and visual disturbance.  Respiratory:  Negative for cough and shortness of breath.   All other systems reviewed and are negative.    Physical Exam Triage Vital Signs ED Triage Vitals  Enc Vitals Group     BP --      Pulse Rate 05/12/22 0918 68     Resp 05/12/22 0918 18     Temp 05/12/22 0918 98.1 F (36.7 C)     Temp src --      SpO2 05/12/22 0918 99 %     Weight 05/12/22 0927 169 lb (76.7 kg)     Height 05/12/22 0927 '5\' 4"'$  (1.626 m)     Head Circumference --      Peak Flow --      Pain Score 05/12/22 0923 1     Pain Loc --      Pain Edu? --      Excl. in [place]? --    No data found.  Updated Vital Signs BP 118/79   Pulse 68   Temp 98.1 F (36.7 C)   Resp 18   Ht '5\' 4"'$  (1.626 m)   Wt 169 lb (76.7 kg)   LMP 04/17/2022   SpO2 99%   BMI 29.01 kg/m   Visual Acuity Right Eye Distance: 20/16 Left Eye Distance: 20/16 Bilateral Distance: 20/16  Right Eye Near:   Left Eye Near:    Bilateral Near:     Physical Exam Vitals and nursing note reviewed.  Constitutional:      General: She is not in acute distress.    Appearance: She is  well-developed. She is not ill-appearing.  HENT:     Right Ear: Tympanic membrane normal.     Left Ear: Tympanic membrane normal.     Nose:  Nose normal.     Mouth/Throat:     Mouth: Mucous membranes are moist.     Pharynx: Oropharynx is clear.  Eyes:     General:        Right eye: No discharge.        Left eye: No discharge.     Extraocular Movements: Extraocular movements intact.     Conjunctiva/sclera: Conjunctivae normal.     Pupils: Pupils are equal, round, and reactive to light.     Comments: Left lower eyelid erythematous and edematous.  See picture. No eye drainage.   Cardiovascular:     Rate and Rhythm: Normal rate and regular rhythm.     Heart sounds: Normal heart sounds.  Pulmonary:     Effort: Pulmonary effort is normal. No respiratory distress.     Breath sounds: Normal breath sounds.  Musculoskeletal:     Cervical back: Neck supple.  Skin:    General: Skin is warm and dry.  Neurological:     Mental Status: She is alert.  Psychiatric:        Mood and Affect: Mood normal.        Behavior: Behavior normal.      UC Treatments / Results  Labs (all labs ordered are listed, but only abnormal results are displayed) Labs Reviewed - No data to display  EKG   Radiology No results found.  Procedures Procedures (including critical care time)  Medications Ordered in UC Medications - No data to display  Initial Impression / Assessment and Plan / UC Course  I have reviewed the triage vital signs and the nursing notes.  Pertinent labs & imaging results that were available during my care of the patient were reviewed by me and considered in my medical decision making (see chart for details).    Preseptal cellulitis of left lower eyelid.  Treating with Augmentin.  Education provided on preseptal cellulitis.  Instructed patient to follow up with her PCP in 2 days for a recheck.  ED precautions discussed.  Patient agrees to plan of care.   Final Clinical Impressions(s) / UC Diagnoses   Final diagnoses:  Preseptal cellulitis of left lower eyelid     Discharge Instructions      Take the Augmentin as  directed.    Follow-up with your primary care provider in 2 days for a recheck    Go to the emergency department if you have acute eye pain, changes in your vision, or other concerning symptoms.        ED Prescriptions     Medication Sig Dispense Auth. Provider   amoxicillin-clavulanate (AUGMENTIN) 875-125 MG tablet Take 1 tablet by mouth every 12 (twelve) hours for 10 days. 20 tablet Sharion Balloon, NP      PDMP not reviewed this encounter.   Sharion Balloon, NP 05/12/22 [phone]

## 2022-05-12 NOTE — Discharge Instructions (Addendum)
Take the Augmentin as directed.    Follow-up with your primary care provider in 2 days for a recheck    Go to the emergency department if you have acute eye pain, changes in your vision, or other concerning symptoms.

## 2022-05-12 NOTE — Progress Notes (Signed)
Pt is advised to proceed to urgent care for periorbital cellulitis. DWB

## 2022-05-12 NOTE — ED Triage Notes (Signed)
Patient to Urgent Care with complaints of left eye swelling, irritation,and itching. Symptoms started Friday morning. Swelling started this morning when she woke up. Has some crust when she woke up but hasn't noticed any drainage. Right eye is itching and irritated.   Patient completed an e-visit and was referred to urgent care due to symptoms.   Denies any known fevers.

## 2022-05-14 ENCOUNTER — Ambulatory Visit: Payer: BC Managed Care – PPO | Admitting: Internal Medicine

## 2022-05-14 ENCOUNTER — Encounter: Payer: Self-pay | Admitting: Internal Medicine

## 2022-05-14 VITALS — BP 124/80 | HR 70 | Temp 97.2°F | Ht 64.0 in | Wt 171.0 lb

## 2022-05-14 DIAGNOSIS — H5789 Other specified disorders of eye and adnexa: Secondary | ICD-10-CM | POA: Diagnosis not present

## 2022-05-14 DIAGNOSIS — E661 Drug-induced obesity: Secondary | ICD-10-CM | POA: Diagnosis not present

## 2022-05-14 DIAGNOSIS — Z6830 Body mass index (BMI) 30.0-30.9, adult: Secondary | ICD-10-CM | POA: Diagnosis not present

## 2022-05-14 MED ORDER — LIRAGLUTIDE 18 MG/3ML ~~LOC~~ SOPN: 1.8000 mg | PEN_INJECTOR | Freq: Every day | SUBCUTANEOUS | 11 refills | Status: DC

## 2022-05-14 NOTE — Assessment & Plan Note (Signed)
Will try the liraglutide again 1.'8mg'$  daily If not covered--will change back to phentermine 37.'5mg'$  daily

## 2022-05-14 NOTE — Assessment & Plan Note (Signed)
I suspect this was more allergic than infectious Asked her to finish out the antibiotic just in case Should restart her antihistamines

## 2022-05-14 NOTE — Progress Notes (Signed)
Subjective:    Patient ID: Sara Howell, female    DOB: 03-29-1976, 47 y.o.   MRN: [id number]  HPI Here for urgent care follow up and to discuss weight loss options  Woke in [facility] 4 days ago Red eye around lower lid on left Eyes tend to be sensitive Then went for eyelash treatment later that day upon getting home No issues then Next day--had more redness on left and some on right Went to urgent care after e-visit---diagnosed with infection and started on augmentin Now mostly just itchy  No alcohol No change in diet Same tea as usual---but did have hot chocolate Did try antihistamine-- 3 days ago  Still on ozempic '2mg'$  now (not wegovy 2.'4mg'$ ) Wegovy not approved at all now Did have saxenda in the past and did tolerate  Did use phentermine in the past  Current Outpatient Medications on File Prior to Visit  Medication Sig Dispense Refill   amoxicillin-clavulanate (AUGMENTIN) 875-125 MG tablet Take 1 tablet by mouth every 12 (twelve) hours for 10 days. 20 tablet 0   levothyroxine (SYNTHROID) 75 MCG tablet TAKE 1 TABLET BY MOUTH DAILY 90 tablet 3   Multiple Vitamin (MULTIVITAMIN) tablet Take 1 tablet by mouth daily.     omeprazole (PRILOSEC) 40 MG capsule Take 1 capsule (40 mg total) by mouth daily. Take 1 capsule (40 mg total ) 30 to 60 minutes before dinner 90 capsule 3   Semaglutide-Weight Management 2.4 MG/0.75ML SOAJ Inject 2.4 mg into the skin once a week. 3 mL 11   Vitamin D, Ergocalciferol, (DRISDOL) 1.25 MG (50000 UNIT) CAPS capsule Take 50,000 Units by mouth every 7 (seven) days.     No current facility-administered medications on file prior to visit.    Allergies  Allergen Reactions   Corn-Containing Products    Eggs Or Egg-Derived Products     Large amounts causes tightness in chest   Other     Honey Chest tightness   Peanut-Containing Drug Products     Past Medical History:  Diagnosis Date   Anal fissure    Anemia    Anxiety    Depression     Hashimoto's thyroiditis 03/10/2017   Hypothyroidism 01/2011   post partum only, no further need for meds   PONV (postoperative nausea and vomiting)    PVC (premature ventricular contraction)    no meds no cardiologist routinely    Past Surgical History:  Procedure Laterality Date   CERVICAL DISCECTOMY  2007   No specified location   GANGLION CYST EXCISION Left 2011   LAPAROSCOPY  09/12/2011   Procedure: LAPAROSCOPY OPERATIVE;  Surgeon: Marylynn Pearson, MD;  Location: [facility];  Service: Gynecology;  Laterality: Right;  right oophorectomy   MUSCLE BIOPSY  as a teenager   non specific; biopsy   NSVD     x 2   WISDOM TOOTH EXTRACTION      Family History  Problem Relation Age of Onset   Hepatitis Father        Hep C   Hypertension Father    Drug abuse Father    Alcohol abuse Father    Anxiety disorder Father    Hypothyroidism Sister    Crohn's disease Brother    Depression Maternal Grandmother    Anxiety disorder Maternal Grandmother    Diabetes Paternal Grandfather    Leukemia Paternal Grandfather    Anxiety disorder Daughter    Seizures Maternal Aunt        Epilepsy  Seizures Maternal Uncle        Epilepsy   Liver cancer Maternal Uncle    Irritable bowel syndrome Child    Irritable bowel syndrome Child    Colon cancer Neg Hx     Social History   Socioeconomic History   Marital status: Married    Spouse name: jimmy   Number of children: 3   Years of education: Not on file   Highest education level: High school graduate  Occupational History   Occupation: Tree surgeon    Comment: [employer] condu[employer]  Tobacco Use   Smoking status: Never    Passive exposure: Past   Smokeless tobacco: Never  Vaping Use   Vaping Use: Never used  Substance and Sexual Activity   Alcohol use: Not Currently    Comment: Rarely   Drug use: No   Sexual activity: Yes    Birth control/protection: None    Comment: husband vasectomy  Other Topics Concern   Not on file  Social  History Narrative   Pt gets regular exercise with cardio 2 days a week and 3 classes a week.   Diet consists of fruits and veggies, doesn't like meat much and likes soda and sweet tea.   Social Determinants of Health   Financial Resource Strain: Low Risk  (07/24/2017)   Overall Financial Resource Strain (CARDIA)    Difficulty of Paying Living Expenses: Not hard at all  Food Insecurity: No Food Insecurity (07/24/2017)   Hunger Vital Sign    Worried About Running Out of Food in the Last Year: Never true    Ran Out of Food in the Last Year: Never true  Transportation Needs: No Transportation Needs (07/24/2017)   PRAPARE - Hydrologist (Medical): No    Lack of Transportation (Non-Medical): No  Physical Activity: Inactive (07/24/2017)   Exercise Vital Sign    Days of Exercise per Week: 0 days    Minutes of Exercise per Session: 0 min  Stress: No Stress Concern Present (07/24/2017)   [place]    Feeling of Stress : Only a little  Social Connections: Moderately Isolated (07/24/2017)   Social Connection and Isolation Panel [NHANES]    Frequency of Communication with Friends and Family: Once a week    Frequency of Social Gatherings with Friends and Family: Never    Attends Religious Services: Never    Marine scientist or Organizations: No    Attends Archivist Meetings: Never    Marital Status: Married  Human resources officer Violence: Not At Risk (07/24/2017)   Humiliation, Afraid, Rape, and Kick questionnaire    Fear of Current or Ex-Partner: No    Emotionally Abused: No    Physically Abused: No    Sexually Abused: No   Review of Systems Known rosacea--mostly on cheeks No fever    Objective:   Physical Exam Constitutional:      Appearance: Normal appearance.  Skin:    Comments: Cheek redness consistent with her rosacea Has pocket of swelling at lowest part of preseptal area  below left area--not really red above No major warmth and not tender  Neurological:     Mental Status: She is alert.            Assessment & Plan:

## 2022-05-17 ENCOUNTER — Other Ambulatory Visit (HOSPITAL_COMMUNITY): Payer: Self-pay

## 2022-07-05 DIAGNOSIS — E039 Hypothyroidism, unspecified: Secondary | ICD-10-CM | POA: Diagnosis not present

## 2022-07-05 DIAGNOSIS — N951 Menopausal and female climacteric states: Secondary | ICD-10-CM | POA: Diagnosis not present

## 2022-07-09 DIAGNOSIS — N951 Menopausal and female climacteric states: Secondary | ICD-10-CM | POA: Diagnosis not present

## 2022-07-09 DIAGNOSIS — R232 Flushing: Secondary | ICD-10-CM | POA: Diagnosis not present

## 2022-07-09 DIAGNOSIS — E063 Autoimmune thyroiditis: Secondary | ICD-10-CM | POA: Diagnosis not present

## 2022-07-09 DIAGNOSIS — E039 Hypothyroidism, unspecified: Secondary | ICD-10-CM | POA: Diagnosis not present

## 2022-07-22 ENCOUNTER — Encounter: Payer: Self-pay | Admitting: Internal Medicine

## 2022-07-23 MED ORDER — PHENTERMINE HCL 37.5 MG PO CAPS: 37.5000 mg | ORAL_CAPSULE | Freq: Every day | ORAL | 5 refills | Status: DC

## 2022-08-20 DIAGNOSIS — E039 Hypothyroidism, unspecified: Secondary | ICD-10-CM | POA: Diagnosis not present

## 2022-08-20 DIAGNOSIS — N951 Menopausal and female climacteric states: Secondary | ICD-10-CM | POA: Diagnosis not present

## 2022-08-22 DIAGNOSIS — R768 Other specified abnormal immunological findings in serum: Secondary | ICD-10-CM | POA: Diagnosis not present

## 2022-08-22 DIAGNOSIS — N951 Menopausal and female climacteric states: Secondary | ICD-10-CM | POA: Diagnosis not present

## 2022-08-22 DIAGNOSIS — R232 Flushing: Secondary | ICD-10-CM | POA: Diagnosis not present

## 2022-08-22 DIAGNOSIS — L42 Pityriasis rosea: Secondary | ICD-10-CM | POA: Diagnosis not present

## 2022-10-11 DIAGNOSIS — Z6830 Body mass index (BMI) 30.0-30.9, adult: Secondary | ICD-10-CM | POA: Diagnosis not present

## 2022-10-11 DIAGNOSIS — N898 Other specified noninflammatory disorders of vagina: Secondary | ICD-10-CM | POA: Diagnosis not present

## 2022-10-11 DIAGNOSIS — R232 Flushing: Secondary | ICD-10-CM | POA: Diagnosis not present

## 2022-10-11 DIAGNOSIS — N951 Menopausal and female climacteric states: Secondary | ICD-10-CM | POA: Diagnosis not present

## 2022-11-26 ENCOUNTER — Telehealth: Payer: BC Managed Care – PPO | Admitting: Physician Assistant

## 2022-11-26 DIAGNOSIS — H1132 Conjunctival hemorrhage, left eye: Secondary | ICD-10-CM

## 2022-11-26 NOTE — Patient Instructions (Signed)
Sara Howell, thank you for joining Piedad Climes, PA-C for today's virtual visit.  While this provider is not your primary care provider (PCP), if your PCP is located in our provider database this encounter information will be shared with them immediately following your visit.   A [facility] MyChart account gives you access to today's visit and all your visits, tests, and labs performed at [facility] " click here if you don't have a [facility] MyChart account or go to [email or web address]  Consent: (Patient) Sara Howell provided verbal consent for this virtual visit at the beginning of the encounter.  Current Medications:  Current Outpatient Medications:    levothyroxine (SYNTHROID) 75 MCG tablet, TAKE 1 TABLET BY MOUTH DAILY, Disp: 90 tablet, Rfl: 3   liraglutide (VICTOZA) 18 MG/3ML SOPN, Inject 1.8 mg into the skin daily., Disp: 9 mL, Rfl: 11   Multiple Vitamin (MULTIVITAMIN) tablet, Take 1 tablet by mouth daily., Disp: , Rfl:    omeprazole (PRILOSEC) 40 MG capsule, Take 1 capsule (40 mg total) by mouth daily. Take 1 capsule (40 mg total ) 30 to 60 minutes before dinner, Disp: 90 capsule, Rfl: 3   phentermine 37.5 MG capsule, Take 1 capsule (37.5 mg total) by mouth daily., Disp: 30 capsule, Rfl: 5   Vitamin D, Ergocalciferol, (DRISDOL) 1.25 MG (50000 UNIT) CAPS capsule, Take 50,000 Units by mouth every 7 (seven) days., Disp: , Rfl:    Medications ordered in this encounter:  No orders of the defined types were placed in this encounter.    *If you need refills on other medications prior to your next appointment, please contact your pharmacy*  Follow-Up: Call back or seek an in-person evaluation if the symptoms worsen or if the condition fails to improve as anticipated.  [facility] [phone]  Other Instructions Subconjunctival Hemorrhage Subconjunctival hemorrhage is bleeding that happens between the white part of your eye (sclera) and  the clear membrane that covers the outside of your eye (conjunctiva). There are many tiny blood vessels near the surface of your eye. A subconjunctival hemorrhage happens when one or more of these vessels breaks and bleeds, causing a red patch to appear on your eye. This is similar to a bruise. Depending on the amount of bleeding, the red patch may only cover a small area of your eye or it may cover the entire visible part of the sclera. If a lot of blood collects under the conjunctiva, there may also be swelling. Subconjunctival hemorrhages do not affect your vision or cause pain, but your eye may feel irritated if there is swelling. Subconjunctival hemorrhages usually do not require treatment, and they usually disappear on their own within two to four weeks. What are the causes? This condition may be caused by: Mild trauma, such as rubbing your eye too hard. Blunt injuries, such as from playing sports or coming into contact with a deployed airbag. Coughing, sneezing, or vomiting. Straining, such as when lifting a heavy object. Medical conditions, such as: High blood pressure. Diabetes. Recent eye surgery. Certain medicines, especially blood thinners (anticoagulants), including aspirin. Other conditions, such as eye tumors, bleeding disorders, or blood vessel abnormalities. Subconjunctival hemorrhages can also happen without an obvious cause. What are the signs or symptoms? Symptoms of this condition include: A bright red or dark red patch on the white part of the eye. The red area may: Spread out to cover a larger area of the eye before it goes away. Turn colors such  as pink or brownish-yellow before it goes away. Swelling around the eye. Mild eye irritation. How is this diagnosed? This condition is diagnosed with a physical exam. If your subconjunctival hemorrhage was caused by trauma, your health care provider may refer you to an eye specialist (ophthalmologist) or another specialist to  check for other injuries. You may have other tests, including: An eye exam including a vision test, checking your eye with a type of microscope (slit lamp) and measuring the pressure in your eye. Your eye may be dilated, especially if your subconjunctival hemorrhage was caused by trauma. A blood pressure check. Blood tests to check for bleeding disorders. If your subconjunctival hemorrhage was caused by trauma, X-rays or a CT scan may be done to check for other injuries. How is this treated? Usually, treatment is not needed for this condition. If you have discomfort, your health care provider may recommend eye drops or cold compresses. Follow these instructions at home: Take over-the-counter and prescription medicines only as directed by your health care provider. Use eye drops or cold compresses to help with discomfort as directed by your health care provider. Avoid activities, things, and environments that may irritate or injure your eye. Keep all follow-up visits. This is important. Contact a health care provider if: You have pain in your eye. The bleeding does not go away within 4 weeks. You keep getting new subconjunctival hemorrhages. Get help right away if: Your vision changes, you have difficulty seeing, or you develop double vision. You suddenly develop severe sensitivity to light. You develop a severe headache, persistent vomiting, confusion, or abnormal tiredness (lethargy). Your eye seems to bulge or protrude from your eye socket. You develop unexplained bruises on your body. You have unexplained bleeding in another area of your body. These symptoms may represent a serious problem that is an emergency. Do not wait to see if the symptoms will go away. Get medical help right away. Call your local emergency services (911 in the [place] Do not drive yourself to the hospital. Summary Subconjunctival hemorrhage is bleeding that happens between the white part of your eye and the clear  membrane that covers the outside of your eye. This condition is similar to a bruise. Subconjunctival hemorrhages usually do not require treatment, and they usually disappear on their own within two to four weeks. Use eye drops or cold compresses to help with discomfort as directed by your health care provider. This information is not intended to replace advice given to you by your health care provider. Make sure you discuss any questions you have with your health care provider. Document Revised: 06/19/2020 Document Reviewed: 06/21/2020 Elsevier Patient Education  2024 Elsevier Inc.    If you have been instructed to have an in-person evaluation today at a local Urgent Care facility, please use the link below. It will take you to a list of all of our available [facility], including address, phone number and hours of operation. Please do not delay care.  [facility]  If you or a family member do not have a primary care provider, use the link below to schedule a visit and establish care. When you choose a [facility] primary care physician or advanced practice provider, you gain a long-term partner in health. Find a Primary Care Provider  Learn more about [facility]'s in-office and virtual care options:  - Get Care Now

## 2022-11-26 NOTE — Progress Notes (Signed)
Virtual Visit Consent   Sara Howell, you are scheduled for a virtual visit with a [facility] provider today. Just as with appointments in the office, your consent must be obtained to participate. Your consent will be active for this visit and any virtual visit you may have with one of our providers in the next 365 days. If you have a MyChart account, a copy of this consent can be sent to you electronically.  As this is a virtual visit, video technology does not allow for your provider to perform a traditional examination. This may limit your provider's ability to fully assess your condition. If your provider identifies any concerns that need to be evaluated in person or the need to arrange testing (such as labs, EKG, etc.), we will make arrangements to do so. Although advances in technology are sophisticated, we cannot ensure that it will always work on either your end or our end. If the connection with a video visit is poor, the visit may have to be switched to a telephone visit. With either a video or telephone visit, we are not always able to ensure that we have a secure connection.  By engaging in this virtual visit, you consent to the provision of healthcare and authorize for your insurance to be billed (if applicable) for the services provided during this visit. Depending on your insurance coverage, you may receive a charge related to this service.  I need to obtain your verbal consent now. Are you willing to proceed with your visit today? Sara Howell has provided verbal consent on 11/26/2022 for a virtual visit (video or telephone). Piedad Climes, [place]  Date: 11/26/2022 6:46 PM  Virtual Visit via Video Note   I, Piedad Climes, connected with  Sara Howell  ([id number], 03-20-1976) on 11/26/22 at  6:30 PM EDT by a video-enabled telemedicine application and verified that I am speaking with the correct person using two identifiers.  Location: Patient: Virtual Visit Location  Patient: Home Provider: Virtual Visit Location Provider: Home Office   I discussed the limitations of evaluation and management by telemedicine and the availability of in person appointments. The patient expressed understanding and agreed to proceed.    History of Present Illness: Sara Howell is a 47 y.o. who identifies as a female who was assigned female at birth, and is being seen today for L eye redness noted by her family member today. Denies any noted trauma or injury or any pain. Notes if she touches the area it is slightly tender. Denies any drainage. Denies vision changes or sensitivity. Denies similar symptoms of R eye.   HPI: HPI  Problems:  Patient Active Problem List   Diagnosis Date Noted   Periorbital swelling 05/14/2022   Cerumen impaction 04/17/2022   Vaginal candida 06/27/2021   Closed fracture of coccyx (HCC) 05/14/2021   GERD (gastroesophageal reflux disease) 05/21/2018   Shoulder joint pain 07/24/2017   Hashimoto's thyroiditis 03/10/2017   Viral URI with cough 06/16/2012   Preventative health care 01/30/2011   Hypothyroidism 05/24/2010   Obesity 05/24/2010   HEMORRHOIDS, INTERNAL 03/08/2008    Allergies:  Allergies  Allergen Reactions   Corn-Containing Products    Egg-Derived Products     Large amounts causes tightness in chest   Other     Honey Chest tightness   Peanut-Containing Drug Products    Medications:  Current Outpatient Medications:    levothyroxine (SYNTHROID) 75 MCG tablet, TAKE 1 TABLET BY MOUTH DAILY, Disp: 90  tablet, Rfl: 3   liraglutide (VICTOZA) 18 MG/3ML SOPN, Inject 1.8 mg into the skin daily., Disp: 9 mL, Rfl: 11   Multiple Vitamin (MULTIVITAMIN) tablet, Take 1 tablet by mouth daily., Disp: , Rfl:    omeprazole (PRILOSEC) 40 MG capsule, Take 1 capsule (40 mg total) by mouth daily. Take 1 capsule (40 mg total ) 30 to 60 minutes before dinner, Disp: 90 capsule, Rfl: 3   phentermine 37.5 MG capsule, Take 1 capsule (37.5 mg total) by  mouth daily., Disp: 30 capsule, Rfl: 5   Vitamin D, Ergocalciferol, (DRISDOL) 1.25 MG (50000 UNIT) CAPS capsule, Take 50,000 Units by mouth every 7 (seven) days., Disp: , Rfl:   Observations/Objective: Patient is well-developed, well-nourished in no acute distress.  Resting comfortably at home.  Head is normocephalic, atraumatic.  No labored breathing. Speech is clear and coherent with logical content.  Patient is alert and oriented at baseline.  Left eye with noted lateral subconjunctival hemorrhage. No conjunctival injection noted. No sign of orbital/periorbital trauma. Pupils are equal and round. EOMI.  Assessment and Plan: 1. Subconjunctival hemorrhage of left eye  Atraumatic. No alarm signs or symptoms. Start warm compresses over eye. Tylenol if needed for mild discomfort but this should resolve quickly. Discussed no history concerning for trauma or conjunctival abrasion, but if any new or worsening symptoms she needs an in-person evaluation ASAP for detailed eye exam.  Discussed the hemorrhage can take several weeks to fully resolve itself but should be no other symptoms.   Follow Up Instructions: I discussed the assessment and treatment plan with the patient. The patient was provided an opportunity to ask questions and all were answered. The patient agreed with the plan and demonstrated an understanding of the instructions.  A copy of instructions were sent to the patient via MyChart unless otherwise noted below.   The patient was advised to call back or seek an in-person evaluation if the symptoms worsen or if the condition fails to improve as anticipated.  Time:  I spent 10 minutes with the patient via telehealth technology discussing the above problems/concerns.    Piedad Climes, PA-C

## 2022-12-16 DIAGNOSIS — E063 Autoimmune thyroiditis: Secondary | ICD-10-CM | POA: Diagnosis not present

## 2022-12-16 DIAGNOSIS — N951 Menopausal and female climacteric states: Secondary | ICD-10-CM | POA: Diagnosis not present

## 2022-12-20 DIAGNOSIS — R232 Flushing: Secondary | ICD-10-CM | POA: Diagnosis not present

## 2022-12-20 DIAGNOSIS — Z6830 Body mass index (BMI) 30.0-30.9, adult: Secondary | ICD-10-CM | POA: Diagnosis not present

## 2022-12-20 DIAGNOSIS — N898 Other specified noninflammatory disorders of vagina: Secondary | ICD-10-CM | POA: Diagnosis not present

## 2022-12-20 DIAGNOSIS — N951 Menopausal and female climacteric states: Secondary | ICD-10-CM | POA: Diagnosis not present

## 2023-02-24 ENCOUNTER — Encounter: Payer: Self-pay | Admitting: Internal Medicine

## 2023-02-24 ENCOUNTER — Ambulatory Visit (INDEPENDENT_AMBULATORY_CARE_PROVIDER_SITE_OTHER): Payer: BC Managed Care – PPO | Admitting: Internal Medicine

## 2023-02-24 VITALS — BP 116/84 | HR 74 | Temp 98.5°F | Ht 64.0 in | Wt 175.0 lb

## 2023-02-24 DIAGNOSIS — J0100 Acute maxillary sinusitis, unspecified: Secondary | ICD-10-CM | POA: Insufficient documentation

## 2023-02-24 MED ORDER — AMOXICILLIN-POT CLAVULANATE 875-125 MG PO TABS: 1.0000 | ORAL_TABLET | Freq: Two times a day (BID) | ORAL | 0 refills | Status: DC

## 2023-02-24 NOTE — Progress Notes (Signed)
Subjective:    Patient ID: Sara Howell, female    DOB: 03/10/1976, 47 y.o.   MRN: [id number]  HPI Here due to respiratory symptoms for 2 days  Ears clogged and crackling Sinus pain Dry hacky cough---bad taste and color Sore throat Itchy feeling in chest---heaviness but no SOB Headache No fever, chills Some sweats---could be hormonal  Tested for COVID twice---negative  Taking advil and tylenol Some mucinex Works from home--due to travel again next week  Current Outpatient Medications on File Prior to Visit  Medication Sig Dispense Refill   levothyroxine (SYNTHROID) 75 MCG tablet TAKE 1 TABLET BY MOUTH DAILY 90 tablet 3   Multiple Vitamin (MULTIVITAMIN) tablet Take 1 tablet by mouth daily.     omeprazole (PRILOSEC) 40 MG capsule Take 1 capsule (40 mg total) by mouth daily. Take 1 capsule (40 mg total ) 30 to 60 minutes before dinner 90 capsule 3   phentermine 37.5 MG capsule Take 1 capsule (37.5 mg total) by mouth daily. 30 capsule 5   No current facility-administered medications on file prior to visit.    Allergies  Allergen Reactions   Corn-Containing Products    Egg-Derived Products     Large amounts causes tightness in chest   Other     Honey Chest tightness   Peanut-Containing Drug Products     Past Medical History:  Diagnosis Date   Anal fissure    Anemia    Anxiety    Depression    Hashimoto's thyroiditis 03/10/2017   Hypothyroidism 01/2011   post partum only, no further need for meds   PONV (postoperative nausea and vomiting)    PVC (premature ventricular contraction)    no meds no cardiologist routinely    Past Surgical History:  Procedure Laterality Date   CERVICAL DISCECTOMY  2007   No specified location   GANGLION CYST EXCISION Left 2011   LAPAROSCOPY  09/12/2011   Procedure: LAPAROSCOPY OPERATIVE;  Surgeon: Zelphia Cairo, MD;  Location: [facility];  Service: Gynecology;  Laterality: Right;  right oophorectomy   MUSCLE BIOPSY  as a teenager    non specific; biopsy   NSVD     x 2   WISDOM TOOTH EXTRACTION      Family History  Problem Relation Age of Onset   Hepatitis Father        Hep C   Hypertension Father    Drug abuse Father    Alcohol abuse Father    Anxiety disorder Father    Hypothyroidism Sister    Crohn's disease Brother    Anxiety disorder Daughter    Seizures Maternal Aunt        Epilepsy   Seizures Maternal Uncle        Epilepsy   Liver cancer Maternal Uncle    Depression Maternal Grandmother    Anxiety disorder Maternal Grandmother    Diabetes Paternal Grandfather    Leukemia Paternal Grandfather    Irritable bowel syndrome Child    Irritable bowel syndrome Child    Colon cancer Neg Hx     Social History   Socioeconomic History   Marital status: Married    Spouse name: jimmy   Number of children: 3   Years of education: Not on file   Highest education level: High school graduate  Occupational History   Occupation: Transport planner    Comment: [employer]  Tobacco Use   Smoking status: Never    Passive exposure: Past   Smokeless tobacco: Never  Vaping Use   Vaping status: Never Used  Substance and Sexual Activity   Alcohol use: Not Currently    Comment: Rarely   Drug use: No   Sexual activity: Yes    Birth control/protection: None    Comment: husband vasectomy  Other Topics Concern   Not on file  Social History Narrative   Pt gets regular exercise with cardio 2 days a week and 3 classes a week.   Diet consists of fruits and veggies, doesn't like meat much and likes soda and sweet tea.   Social Determinants of Health   Financial Resource Strain: Low Risk  (07/24/2017)   Overall Financial Resource Strain (CARDIA)    Difficulty of Paying Living Expenses: Not hard at all  Food Insecurity: Unknown (01/27/2023)   Received from [employer] Insecurity    In the past 3 months, have you had to go without food for 24 hours, multiple times due to lack of resources?: Not on  file  Transportation Needs: Unknown (01/27/2023)   Received from [employer] Needs    In the past 3 months, has lack of transporation kept you from medical appointments or getting things you need that are essential to your health?: Not on file  Physical Activity: Inactive (07/24/2017)   Exercise Vital Sign    Days of Exercise per Week: 0 days    Minutes of Exercise per Session: 0 min  Stress: No Stress Concern Present (07/24/2017)   [employer] of Occupational Health - Occupational Stress Questionnaire    Feeling of Stress : Only a little  Social Connections: Unknown (01/27/2023)   Received from [facility] [place]   Social Connections    In the past 3 months, do you feel that you lack companionship or social support?: Not on file  Intimate Partner Violence: Not At Risk (07/24/2017)   Humiliation, Afraid, Rape, and Kick questionnaire    Fear of Current or Ex-Partner: No    Emotionally Abused: No    Physically Abused: No    Sexually Abused: No   Review of Systems Had food poisoning while in [place] a month ago Recent trip to [place]--father died unexpectedly--came back a week ago    Objective:   Physical Exam Constitutional:      Appearance: Normal appearance.  HENT:     Head:     Comments: Maxillary tenderness    Right Ear: Tympanic membrane and ear canal normal.     Left Ear: Tympanic membrane and ear canal normal.     Mouth/Throat:     Pharynx: No oropharyngeal exudate or posterior oropharyngeal erythema.  Pulmonary:     Effort: Pulmonary effort is normal.     Breath sounds: Normal breath sounds. No wheezing or rales.  Musculoskeletal:     Cervical back: Neck supple.  Lymphadenopathy:     Cervical: No cervical adenopathy.  Neurological:     Mental Status: She is alert.            Assessment & Plan:

## 2023-02-24 NOTE — Assessment & Plan Note (Signed)
Probably viral Discussed symptomatic relief If worsens later in the week, will fill Rx for augmentin 875 bid x 7 days

## 2023-02-25 ENCOUNTER — Other Ambulatory Visit: Payer: Self-pay | Admitting: Certified Nurse Midwife

## 2023-02-25 ENCOUNTER — Other Ambulatory Visit: Payer: Self-pay | Admitting: Gastroenterology

## 2023-02-25 DIAGNOSIS — Z1231 Encounter for screening mammogram for malignant neoplasm of breast: Secondary | ICD-10-CM

## 2023-03-12 NOTE — Progress Notes (Unsigned)
GYNECOLOGY ANNUAL PREVENTATIVE CARE ENCOUNTER NOTE  History:     Sara Howell is a 47 y.o. [phone] female here for a routine annual gynecologic exam.  Current complaints: weight and abdominal pain.   Denies abnormal vaginal bleeding, discharge, pelvic pain, problems with intercourse or other gynecologic concerns.     Social Relationship:married Living: spouse , son-13, 2 adult daughters Work: Production designer, theatre/television/film Exercise: boot camp Smoke/Alcohol/drug use: occasional alcohol use. Denies drugs and smoking.   Gynecologic History No LMP recorded (lmp unknown). Contraception: vasectomy Last Pap: 07/11/2021. Results were: ASC-US with negative HPV Last mammogram: 07/27/2021. Results were: normal  Obstetric History OB History  Gravida Para Term Preterm AB Living  4 3 3   1 3   SAB IAB Ectopic Multiple Live Births          3    # Outcome Date GA Lbr Len/2nd Weight Sex Type Anes PTL Lv  4 Term 06/27/09   8 lb 6 oz (3.799 kg) M Vag-Spont  N LIV  3 AB 2010 [redacted]w[redacted]d         2 Term 10/03/00   9 lb 6 oz (4.252 kg) F Vag-Spont  N LIV  1 Term 06/19/94   8 lb 3 oz (3.714 kg) F Vag-Spont   LIV    Past Medical History:  Diagnosis Date   Anal fissure    Anemia    Anxiety    Depression    Hashimoto's thyroiditis 03/10/2017   Hypothyroidism 01/2011   post partum only, no further need for meds   PONV (postoperative nausea and vomiting)    PVC (premature ventricular contraction)    no meds no cardiologist routinely    Past Surgical History:  Procedure Laterality Date   CERVICAL DISCECTOMY  2007   No specified location   GANGLION CYST EXCISION Left 2011   LAPAROSCOPY  09/12/2011   Procedure: LAPAROSCOPY OPERATIVE;  Surgeon: Zelphia Cairo, MD;  Location: [facility];  Service: Gynecology;  Laterality: Right;  right oophorectomy   MUSCLE BIOPSY  as a teenager   non specific; biopsy   NSVD     x 2   WISDOM TOOTH EXTRACTION      Current Outpatient Medications on File Prior to Visit   Medication Sig Dispense Refill   amoxicillin-clavulanate (AUGMENTIN) 875-125 MG tablet Take 1 tablet by mouth 2 (two) times daily. 14 tablet 0   levothyroxine (SYNTHROID) 75 MCG tablet TAKE 1 TABLET BY MOUTH DAILY 90 tablet 3   Multiple Vitamin (MULTIVITAMIN) tablet Take 1 tablet by mouth daily.     omeprazole (PRILOSEC) 40 MG capsule TAKE 1 CAPSULE BY MOUTH ONCE DAILY 30 TO60 MINUTES BEFORE DINNER 90 capsule 0   phentermine 37.5 MG capsule Take 1 capsule (37.5 mg total) by mouth daily. 30 capsule 5   No current facility-administered medications on file prior to visit.    Allergies  Allergen Reactions   Corn-Containing Products    Egg-Derived Products     Large amounts causes tightness in chest   Other     Honey Chest tightness   Peanut-Containing Drug Products     Social History:  reports that she has never smoked. She has been exposed to tobacco smoke. She has never used smokeless tobacco. She reports that she does not currently use alcohol. She reports that she does not use drugs.  Family History  Problem Relation Age of Onset   Hepatitis Father        Hep C  Hypertension Father    Drug abuse Father    Alcohol abuse Father    Anxiety disorder Father    Hypothyroidism Sister    Crohn's disease Brother    Anxiety disorder Daughter    Seizures Maternal Aunt        Epilepsy   Seizures Maternal Uncle        Epilepsy   Liver cancer Maternal Uncle    Depression Maternal Grandmother    Anxiety disorder Maternal Grandmother    Diabetes Paternal Grandfather    Leukemia Paternal Grandfather    Irritable bowel syndrome Child    Irritable bowel syndrome Child    Colon cancer Neg Hx     The following portions of the patient's history were reviewed and updated as appropriate: allergies, current medications, past family history, past medical history, past social history, past surgical history and problem list.  Review of Systems Pertinent items noted in HPI and remainder of  comprehensive ROS otherwise negative.  Physical Exam:  BP 132/80   Pulse 72   Resp 16   Ht 5\' 5"  (1.651 m)   Wt 176 lb 6.4 oz (80 kg)   BMI 29.35 kg/m  CONSTITUTIONAL: Well-developed, well-nourished female in no acute distress.  HENT:  Normocephalic, atraumatic, External right and left ear normal. Oropharynx is clear and moist EYES: Conjunctivae and EOM are normal. Pupils are equal, round, and reactive to light. No scleral icterus.  NECK: Normal range of motion, supple, no masses.  Normal thyroid.  SKIN: Skin is warm and dry. No rash noted. Not diaphoretic. No erythema. No pallor. MUSCULOSKELETAL: Normal range of motion. No tenderness.  No cyanosis, clubbing, or edema.  2+ distal pulses. NEUROLOGIC: Alert and oriented to person, place, and time. Normal reflexes, muscle tone coordination.  PSYCHIATRIC: Normal mood and affect. Normal behavior. Normal judgment and thought content. CARDIOVASCULAR: Normal heart rate noted, regular rhythm RESPIRATORY: Clear to auscultation bilaterally. Effort and breath sounds normal, no problems with respiration noted. BREASTS: Symmetric in size. No masses, tenderness, skin changes, nipple drainage, or lymphadenopathy bilaterally.  ABDOMEN: Soft, no distention noted.  No tenderness, rebound or guarding.  PELVIC: Normal appearing external genitalia and urethral meatus; normal appearing vaginal mucosa and cervix.  No abnormal discharge noted.  Pap smear not due.  Normal uterine size, no other palpable masses, no uterine or adnexal tenderness.  .   Assessment and Plan:  Annual Well Women GYN Exam   Pap:UTD Mammogram :Scheduled for today Labs:Pending Refills:N/A Referral: none Pt does not meet requirements of BMI 30 or greater for referral to weight management in [facility]  Routine preventative health maintenance measures emphasized. Please refer to After Visit Summary for other counseling recommendations.      Doreene Burke, CNM [facility]   [facility],  [facility]

## 2023-03-13 ENCOUNTER — Ambulatory Visit (INDEPENDENT_AMBULATORY_CARE_PROVIDER_SITE_OTHER): Payer: BC Managed Care – PPO | Admitting: Certified Nurse Midwife

## 2023-03-13 ENCOUNTER — Encounter: Payer: Self-pay | Admitting: Certified Nurse Midwife

## 2023-03-13 ENCOUNTER — Ambulatory Visit
Admission: RE | Admit: 2023-03-13 | Discharge: 2023-03-13 | Disposition: A | Discharge status: Home / Self Care | Payer: BC Managed Care – PPO | Source: Ambulatory Visit | Attending: Certified Nurse Midwife | Admitting: Certified Nurse Midwife

## 2023-03-13 VITALS — BP 132/80 | HR 72 | Resp 16 | Ht 65.0 in | Wt 176.4 lb

## 2023-03-13 DIAGNOSIS — Z131 Encounter for screening for diabetes mellitus: Secondary | ICD-10-CM

## 2023-03-13 DIAGNOSIS — Z01419 Encounter for gynecological examination (general) (routine) without abnormal findings: Secondary | ICD-10-CM | POA: Diagnosis not present

## 2023-03-13 DIAGNOSIS — Z1231 Encounter for screening mammogram for malignant neoplasm of breast: Secondary | ICD-10-CM | POA: Insufficient documentation

## 2023-03-13 DIAGNOSIS — E063 Autoimmune thyroiditis: Secondary | ICD-10-CM | POA: Diagnosis not present

## 2023-03-13 DIAGNOSIS — Z8639 Personal history of other endocrine, nutritional and metabolic disease: Secondary | ICD-10-CM

## 2023-03-13 DIAGNOSIS — Z13220 Encounter for screening for lipoid disorders: Secondary | ICD-10-CM

## 2023-03-14 ENCOUNTER — Encounter: Payer: Self-pay | Admitting: Certified Nurse Midwife

## 2023-03-14 DIAGNOSIS — E039 Hypothyroidism, unspecified: Secondary | ICD-10-CM | POA: Diagnosis not present

## 2023-03-14 LAB — COMPREHENSIVE METABOLIC PANEL
ALT: 13 [IU]/L (ref 0–32)
AST: 20 [IU]/L (ref 0–40)
Albumin: 4.3 g/dL (ref 3.9–4.9)
Alkaline Phosphatase: 89 [IU]/L (ref 44–121)
BUN/Creatinine Ratio: 11 (ref 9–23)
BUN: 11 mg/dL (ref 6–24)
Bilirubin Total: 0.7 mg/dL (ref 0.0–1.2)
CO2: 23 mmol/L (ref 20–29)
Calcium: 9.4 mg/dL (ref 8.7–10.2)
Chloride: 105 mmol/L (ref 96–106)
Creatinine, Ser: 1 mg/dL (ref 0.57–1.00)
Globulin, Total: 2.6 g/dL (ref 1.5–4.5)
Glucose: 81 mg/dL (ref 70–99)
Potassium: 4.2 mmol/L (ref 3.5–5.2)
Sodium: 143 mmol/L (ref 134–144)
Total Protein: 6.9 g/dL (ref 6.0–8.5)
eGFR: 69 mL/min/{1.73_m2} (ref >59)

## 2023-03-14 LAB — LIPID PANEL
Chol/HDL Ratio: 3.6 ratio (ref 0.0–4.4)
Cholesterol, Total: 197 mg/dL (ref 100–199)
HDL: 55 mg/dL (ref >39)
LDL Chol Calc (NIH): 131 mg/dL — ABNORMAL HIGH (ref 0–99)
Triglycerides: 58 mg/dL (ref 0–149)
VLDL Cholesterol Cal: 11 mg/dL (ref 5–40)

## 2023-03-14 LAB — CBC
Hematocrit: 40.4 % (ref 34.0–46.6)
Hemoglobin: 13.1 g/dL (ref 11.1–15.9)
MCH: 29.3 pg (ref 26.6–33.0)
MCHC: 32.4 g/dL (ref 31.5–35.7)
MCV: 90 fL (ref 79–97)
Platelets: 253 10*3/uL (ref 150–450)
RBC: 4.47 x10E6/uL (ref 3.77–5.28)
RDW: 13.3 % (ref 11.7–15.4)
WBC: 4.8 10*3/uL (ref 3.4–10.8)

## 2023-03-14 LAB — VITAMIN D 25 HYDROXY (VIT D DEFICIENCY, FRACTURES): Vit D, 25-Hydroxy: 34.6 ng/mL (ref 30.0–100.0)

## 2023-03-14 LAB — TSH: TSH: 10.5 u[IU]/mL — ABNORMAL HIGH (ref 0.450–4.500)

## 2023-03-14 LAB — HEMOGLOBIN A1C
Est. average glucose Bld gHb Est-mCnc: 100 mg/dL
Hgb A1c MFr Bld: 5.1 % (ref 4.8–5.6)

## 2023-04-02 ENCOUNTER — Encounter: Payer: BC Managed Care – PPO | Admitting: Internal Medicine

## 2023-04-09 DIAGNOSIS — E039 Hypothyroidism, unspecified: Secondary | ICD-10-CM | POA: Diagnosis not present

## 2023-04-09 DIAGNOSIS — N951 Menopausal and female climacteric states: Secondary | ICD-10-CM | POA: Diagnosis not present

## 2023-04-11 DIAGNOSIS — N898 Other specified noninflammatory disorders of vagina: Secondary | ICD-10-CM | POA: Diagnosis not present

## 2023-04-11 DIAGNOSIS — E039 Hypothyroidism, unspecified: Secondary | ICD-10-CM | POA: Diagnosis not present

## 2023-04-11 DIAGNOSIS — N951 Menopausal and female climacteric states: Secondary | ICD-10-CM | POA: Diagnosis not present

## 2023-04-11 DIAGNOSIS — E063 Autoimmune thyroiditis: Secondary | ICD-10-CM | POA: Diagnosis not present

## 2023-04-16 ENCOUNTER — Ambulatory Visit (INDEPENDENT_AMBULATORY_CARE_PROVIDER_SITE_OTHER): Payer: BC Managed Care – PPO | Admitting: Internal Medicine

## 2023-04-16 ENCOUNTER — Encounter: Payer: Self-pay | Admitting: Internal Medicine

## 2023-04-16 VITALS — BP 130/84 | HR 60 | Temp 97.1°F | Ht 63.75 in | Wt 178.0 lb

## 2023-04-16 DIAGNOSIS — K219 Gastro-esophageal reflux disease without esophagitis: Secondary | ICD-10-CM | POA: Diagnosis not present

## 2023-04-16 DIAGNOSIS — E039 Hypothyroidism, unspecified: Secondary | ICD-10-CM

## 2023-04-16 DIAGNOSIS — Z6830 Body mass index (BMI) 30.0-30.9, adult: Secondary | ICD-10-CM

## 2023-04-16 DIAGNOSIS — Z0000 Encounter for general adult medical examination without abnormal findings: Secondary | ICD-10-CM | POA: Diagnosis not present

## 2023-04-16 DIAGNOSIS — E66811 Obesity, class 1: Secondary | ICD-10-CM

## 2023-04-16 MED ORDER — WEGOVY 0.25 MG/0.5ML ~~LOC~~ SOAJ: 0.2500 mg | SUBCUTANEOUS | 1 refills | Status: DC

## 2023-04-16 NOTE — Progress Notes (Signed)
Subjective:    Patient ID: Sara Howell, female    DOB: [birth year], 47 y.o.   MRN: [id number]  HPI Here for physical  Still working out with trainer Doing [employer], etc Still frustrated and would like to be back on GLP-1 agonist Weight/BMI back over 30  Getting more palpitations since off the semaglutide Thinks her weight has worsened the thyroid control--seeing doctor in [place] for this Recently had liothyronine added  Heartburn okay with omeprazole--as long as she doesn't miss a dose  Current Outpatient Medications on File Prior to Visit  Medication Sig Dispense Refill   levothyroxine (SYNTHROID) 75 MCG tablet TAKE 1 TABLET BY MOUTH DAILY 90 tablet 3   liothyronine (CYTOMEL) 5 MCG tablet Take 5 mcg by mouth daily.     Multiple Vitamin (MULTIVITAMIN) tablet Take 1 tablet by mouth daily.     omeprazole (PRILOSEC) 40 MG capsule TAKE 1 CAPSULE BY MOUTH ONCE DAILY 30 TO60 MINUTES BEFORE DINNER 90 capsule 0   phentermine 37.5 MG capsule Take 1 capsule (37.5 mg total) by mouth daily. 30 capsule 5   No current facility-administered medications on file prior to visit.    Allergies  Allergen Reactions   Corn-Containing Products    Egg-Derived Products     Large amounts causes tightness in chest   Other     Honey Chest tightness   Peanut-Containing Drug Products     Past Medical History:  Diagnosis Date   Anal fissure    Anemia    Anxiety    Depression    Hashimoto's thyroiditis 03/10/2017   Hypothyroidism 01/2011   post partum only, no further need for meds   PONV (postoperative nausea and vomiting)    PVC (premature ventricular contraction)    no meds no cardiologist routinely    Past Surgical History:  Procedure Laterality Date   CERVICAL DISCECTOMY  2007   No specified location   GANGLION CYST EXCISION Left 2011   LAPAROSCOPY  09/12/2011   Procedure: LAPAROSCOPY OPERATIVE;  Surgeon: Zelphia Cairo, MD;  Location: [facility];  Service: Gynecology;   Laterality: Right;  right oophorectomy   MUSCLE BIOPSY  as a teenager   non specific; biopsy   NSVD     x 2   WISDOM TOOTH EXTRACTION      Family History  Problem Relation Age of Onset   Hepatitis Father        Hep C   Hypertension Father    Drug abuse Father    Alcohol abuse Father    Anxiety disorder Father    Hypothyroidism Sister    Crohn's disease Brother    Anxiety disorder Daughter    Seizures Maternal Aunt        Epilepsy   Seizures Maternal Uncle        Epilepsy   Liver cancer Maternal Uncle    Depression Maternal Grandmother    Anxiety disorder Maternal Grandmother    Diabetes Paternal Grandfather    Leukemia Paternal Grandfather    Irritable bowel syndrome Child    Irritable bowel syndrome Child    Colon cancer Neg Hx     Social History   Socioeconomic History   Marital status: Married    Spouse name: jimmy   Number of children: 3   Years of education: Not on file   Highest education level: High school graduate  Occupational History   Occupation: Transport planner    Comment: [employer]  Tobacco Use   Smoking status:  Never    Passive exposure: Past   Smokeless tobacco: Never  Vaping Use   Vaping status: Never Used  Substance and Sexual Activity   Alcohol use: Not Currently    Comment: Rarely   Drug use: No   Sexual activity: Yes    Birth control/protection: None    Comment: husband vasectomy  Other Topics Concern   Not on file  Social History Narrative   Pt gets regular exercise with cardio 2 days a week and 3 classes a week.   Diet consists of fruits and veggies, doesn't like meat much and likes soda and sweet tea.   Social Drivers of Corporate investment banker Strain: Low Risk  (07/24/2017)   Overall Financial Resource Strain (CARDIA)    Difficulty of Paying Living Expenses: Not hard at all  Food Insecurity: Unknown (01/27/2023)   Received from [employer] Insecurity    In the past 3 months, have you had to go without  food for 24 hours, multiple times due to lack of resources?: Not on file  Transportation Needs: Unknown (01/27/2023)   Received from [employer] Needs    In the past 3 months, has lack of transporation kept you from medical appointments or getting things you need that are essential to your health?: Not on file  Physical Activity: Inactive (07/24/2017)   Exercise Vital Sign    Days of Exercise per Week: 0 days    Minutes of Exercise per Session: 0 min  Stress: No Stress Concern Present (07/24/2017)   [employer] of Occupational Health - Occupational Stress Questionnaire    Feeling of Stress : Only a little  Social Connections: Unknown (01/27/2023)   Received from [facility]   Social Connections    In the past 3 months, do you feel that you lack companionship or social support?: Not on file  Intimate Partner Violence: Not At Risk (07/24/2017)   Humiliation, Afraid, Rape, and Kick questionnaire    Fear of Current or Ex-Partner: No    Emotionally Abused: No    Physically Abused: No    Sexually Abused: No   Review of Systems  Constitutional:  Negative for fatigue.       Weight is up  HENT:  Positive for hearing loss. Negative for dental problem and tinnitus.        Keeps up with dentist  Eyes:  Negative for visual disturbance.       No diplopia or unilateral vision loss  Respiratory:  Positive for chest tightness. Negative for shortness of breath.        Mild residual cough from recent illness  Cardiovascular:  Positive for palpitations. Negative for chest pain.       Edema with travel--resolves over time  Gastrointestinal:  Negative for blood in stool.       Gets episodic pain in abdomen --since colitis  Endocrine: Negative for polydipsia and polyuria.  Genitourinary:  Negative for dysuria and hematuria.       LMP in August Mild dyspareunia--nothing new  Musculoskeletal:  Negative for joint swelling.       Occ back/joint pain--related to work outs  Skin:         Has spots on scalp and in left axilla  Allergic/Immunologic: Negative for immunocompromised state.       Uses OTC meds in season  Neurological:  Positive for dizziness. Negative for syncope and headaches.  Hematological:  Negative for adenopathy. Bruises/bleeds easily.  Psychiatric/Behavioral:  Negative for dysphoric mood and sleep disturbance.        Worries more lately--but nothing bad       Objective:   Physical Exam Constitutional:      Appearance: Normal appearance.  HENT:     Mouth/Throat:     Pharynx: No oropharyngeal exudate or posterior oropharyngeal erythema.  Eyes:     Conjunctiva/sclera: Conjunctivae normal.     Pupils: Pupils are equal, round, and reactive to light.  Cardiovascular:     Rate and Rhythm: Normal rate and regular rhythm.     Pulses: Normal pulses.     Heart sounds: No murmur heard.    No gallop.  Pulmonary:     Effort: Pulmonary effort is normal.     Breath sounds: Normal breath sounds. No wheezing or rales.  Abdominal:     Palpations: Abdomen is soft.     Tenderness: There is no abdominal tenderness.  Musculoskeletal:     Cervical back: Neck supple.     Right lower leg: No edema.     Left lower leg: No edema.  Lymphadenopathy:     Cervical: No cervical adenopathy.  Skin:    Findings: No rash.     Comments: 2 small benign papules on occiput Nothing in axilla/breast  Neurological:     General: No focal deficit present.     Mental Status: She is alert and oriented to person, place, and time.  Psychiatric:        Mood and Affect: Mood normal.        Behavior: Behavior normal.            Assessment & Plan:

## 2023-04-16 NOTE — Assessment & Plan Note (Signed)
Healthy Does regular exercise Colon due again 2028 Recent mammogram--yearly Pap per gyn Still prefers no flu/COVID vaccines

## 2023-04-16 NOTE — Assessment & Plan Note (Signed)
Follows with doctor in [place]

## 2023-04-16 NOTE — Assessment & Plan Note (Signed)
Does okay with daily omeprazole Hasn't tolerated weaning

## 2023-04-16 NOTE — Assessment & Plan Note (Addendum)
Will try wegovy again--see if it is covered Stop phentermine if gets to start

## 2023-05-05 ENCOUNTER — Telehealth: Payer: Self-pay

## 2023-05-05 NOTE — Telephone Encounter (Signed)
 Copied from CRM [id number]. Topic: Clinical - Prescription Issue >> May 05, 2023 10:51 AM Hector Shade B wrote: Reason for CRM: Per patient request please have Ms. Remmy Riffe patient in regards to the medication [facility]

## 2023-05-06 NOTE — Telephone Encounter (Signed)
 Forms filled out for [facility] and faxed to insurance. Can take up to 5 days for a response.

## 2023-05-08 ENCOUNTER — Other Ambulatory Visit (HOSPITAL_COMMUNITY): Payer: Self-pay

## 2023-05-08 NOTE — Telephone Encounter (Signed)
 Received fax from insurance. Medication approved 04/07/23 to 05/06/24. Letter sent to scan and sending a my chart to patient to make them aware.

## 2023-05-29 ENCOUNTER — Encounter: Payer: Self-pay | Admitting: Gastroenterology

## 2023-05-29 ENCOUNTER — Ambulatory Visit: Payer: BC Managed Care – PPO | Admitting: Gastroenterology

## 2023-05-29 VITALS — BP 130/80 | HR 68 | Ht 64.0 in | Wt 182.0 lb

## 2023-05-29 DIAGNOSIS — K219 Gastro-esophageal reflux disease without esophagitis: Secondary | ICD-10-CM

## 2023-05-29 DIAGNOSIS — Z7189 Other specified counseling: Secondary | ICD-10-CM

## 2023-05-29 DIAGNOSIS — Z860100 Personal history of colon polyps, unspecified: Secondary | ICD-10-CM | POA: Diagnosis not present

## 2023-05-29 MED ORDER — OMEPRAZOLE 40 MG PO CPDR: 40.0000 mg | DELAYED_RELEASE_CAPSULE | Freq: Every day | ORAL | 3 refills | Status: DC

## 2023-05-29 NOTE — Progress Notes (Signed)
Chief Complaint:    GERD, medication refill   HPI:     Patient is a 48 y.o. female presenting to the Gastroenterology Clinic for follow-up and requesting refill of omeprazole.  She was last seen by me on 11/27/2021.  Had increased omeprazole to 40 mg daily due to intermittent breakthrough.  Longstanding history of reflux since ~2019 related to weight gain during dx of Hashimoto's and subsequent hypothyroidism. Index sxs of heartburn, regurgitation, nausea, dyspepsia, belching. +nocturnal sxs.  No dysphagia.  Worse with acidic foods. Previously trialed Zantac until recall, then started taking omeprazole 20 mg/day for a few years without much improvement and was using Tums x2 daily for breakthrough. Increased Prilosec to 40 mg/day in 2023 with improvement. Will have breakthrough if weight gain or eating late, but tends to avoid eating within 3 hours of bedtime. Crosby Oyster 2 weeks ago.   Family history notable for brother with Crohn's Disease.    Reviewed labs from 02/2023: Vitamin D 34.6,  normal CMP, CBC, A1c  Endoscopic History: - 12/03/2021: EGD: Hill grade 3 valve, benign fundic gland polyps, otherwise normal - 12/03/2021: Colonoscopy: 6 mm ascending colon sessile serrated polyp, 3 mm rectal hyperplastic polyp, diverticulosis.  Normal TI.  Repeat 5 years.  Review of systems:     No chest pain, no SOB, no fevers, no urinary sx   Past Medical History:  Diagnosis Date   Anal fissure    Anemia    Anxiety    Depression    Hashimoto's thyroiditis 03/10/2017   Hypothyroidism 01/2011   post partum only, no further need for meds   PONV (postoperative nausea and vomiting)    PVC (premature ventricular contraction)    no meds no cardiologist routinely    Patient's surgical history, family medical history, social history, medications and allergies were all reviewed in Epic    Current Outpatient Medications  Medication Sig Dispense Refill   levothyroxine (SYNTHROID) 75 MCG tablet  TAKE 1 TABLET BY MOUTH DAILY 90 tablet 3   liothyronine (CYTOMEL) 5 MCG tablet Take 5 mcg by mouth daily.     Multiple Vitamin (MULTIVITAMIN) tablet Take 1 tablet by mouth daily.     naltrexone (DEPADE) 50 MG tablet Take 25 mg by mouth daily.     omeprazole (PRILOSEC) 40 MG capsule TAKE 1 CAPSULE BY MOUTH ONCE DAILY 30 TO60 MINUTES BEFORE DINNER 90 capsule 0   Semaglutide-Weight Management (WEGOVY) 0.25 MG/0.5ML SOAJ Inject 0.25 mg into the skin once a week. 2 mL 1   No current facility-administered medications for this visit.    Physical Exam:     BP 130/80   Pulse 68   Ht 5\' 4"  (1.626 m)   Wt 182 lb (82.6 kg)   BMI 31.24 kg/m   GENERAL:  Pleasant female in NAD PSYCH: : Cooperative, normal affect Musculoskeletal:  Normal muscle tone, normal strength NEURO: Alert and oriented x 3, no focal neurologic deficits   IMPRESSION and PLAN:    1) GERD 2) Medication counseling Reflux well-controlled on current therapy. - Resume Prilosec - Placed Rx for Prilosec 40 mg daily #90, RF 5 - If reflux symptoms well-controlled on stable dose of Prilosec, can continue refills either via PCP follow-up or through this office, whichever is easier for her - Continue antireflux lifestyle/dietary modifications  3) History of colon polyps - Repeat colonoscopy in 11/2026 for ongoing polyp surveillance   RTC in 2 years or sooner as needed  Verlin Dike Daire Okimoto ,DO, FACG 05/29/2023, 11:00 AM

## 2023-05-29 NOTE — Patient Instructions (Signed)
_______________________________________________________  If your blood pressure at your visit was 140/90 or greater, please contact your primary care physician to follow up on this.  If you are age 49 or younger, your body mass index should be between 19-25. Your Body mass index is 31.24 kg/m. If this is out of the aformentioned range listed, please consider follow up with your Primary Care Provider.  ________________________________________________________  The [place] [facility] providers would like to encourage you to use [facility] to communicate with providers for non-urgent requests or questions.  Due to long hold times on the telephone, sending your provider a message by [facility] may be a faster and more efficient way to get a response.  Please allow 48 business hours for a response.  Please remember that this is for non-urgent requests.  _______________________________________________________  We have sent the following medications to your pharmacy for you to pick up at your convenience:  CONTINUE: prilosec 40mg  one tablet daily  You will need to follow up in 2 years.  It was a pleasure to see you today!  Vito Cirigliano, D.O.

## 2023-06-02 MED ORDER — SEMAGLUTIDE-WEIGHT MANAGEMENT 0.5 MG/0.5ML ~~LOC~~ SOAJ: 0.5000 mg | SUBCUTANEOUS | 1 refills | Status: DC

## 2023-06-02 NOTE — Addendum Note (Signed)
Addended by: Tillman Abide I on: 06/02/2023 12:51 PM   Modules accepted: Orders

## 2023-06-19 ENCOUNTER — Ambulatory Visit: Payer: Self-pay | Admitting: Internal Medicine

## 2023-06-19 ENCOUNTER — Ambulatory Visit: Payer: BC Managed Care – PPO | Admitting: Internal Medicine

## 2023-06-19 ENCOUNTER — Encounter: Payer: Self-pay | Admitting: Internal Medicine

## 2023-06-19 VITALS — BP 136/88 | HR 78 | Temp 98.6°F | Ht 64.0 in | Wt 183.0 lb

## 2023-06-19 DIAGNOSIS — J069 Acute upper respiratory infection, unspecified: Secondary | ICD-10-CM | POA: Insufficient documentation

## 2023-06-19 LAB — POCT INFLUENZA A/B
Influenza A, POC: NEGATIVE
Influenza B, POC: NEGATIVE

## 2023-06-19 LAB — POC COVID19 BINAXNOW: SARS Coronavirus 2 Ag: NEGATIVE

## 2023-06-19 NOTE — Telephone Encounter (Signed)
Copied from CRM [id number]. Topic: Clinical - Red Word Triage >> Jun 19, 2023 10:36 AM Kathryne Eriksson wrote: Patient states she's experiencing multiple symptoms such as: body aches, fever, nausea / vomiting, sinus congestion / pain.    Chief Complaint: Fatigue Symptoms: throat hurts, sinus pain, cough, chest tight, sinus pain, congestion, nausea, vomiting Pertinent Negatives: Patient denies difficulty breathing Disposition: [] ED /[] Urgent Care (no appt availability in office) / [x] Appointment(In office/virtual)/ []  [facility]/ [] Home Care/ [] Refused Recommended Disposition /[] [facility]/ []  Follow-up with PCP Additional Notes: Patient is experiencing sore throat, cough with chest congestion/tightness, sinus pain and nausea. Appointment scheduled for today.  Reason for Disposition  [1] Sinus pain (not just congestion) AND [2] fever  Answer Assessment - Initial Assessment Questions 1. LOCATION: "Where does it hurt?"      Head  2. ONSET: "When did the sinus pain start?"  (e.g., hours, days)      Yesterday  3. SEVERITY: "How bad is the pain?"   (Scale 1-10; mild, moderate or severe)   - MILD (1-3): doesn't interfere with normal activities    - MODERATE (4-7): interferes with normal activities (e.g., work or school) or awakens from sleep   - SEVERE (8-10): excruciating pain and patient unable to do any normal activities        10/10  4. FEVER: "Do you have a fever?" If Yes, ask: "What is it, how was it measured, and when did it start?"      Patient reported temps ranging from 99-100.2  5. OTHER SYMPTOMS: "Do you have any other symptoms?" (e.g., sore throat, cough, earache, difficulty breathing)     Sore throat, nasal congestion, nausea, vomiting  Protocols used: Sinus Pain or Congestion-A-AH
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
## 2023-06-19 NOTE — Addendum Note (Signed)
Addended by: Eual Fines on: 06/19/2023 03:02 PM   Modules accepted: Orders

## 2023-06-19 NOTE — Assessment & Plan Note (Signed)
Couple of days of fatigue--then hit harder last night FLu and COVID are negative Discussed symptom relief--mostly analgesics If worsens next week--would treat empirically for secondary sinus infection (augmentin)

## 2023-06-19 NOTE — Telephone Encounter (Signed)
Will assess her at the [facility]

## 2023-07-07 MED ORDER — WEGOVY 1 MG/0.5ML ~~LOC~~ SOAJ: 1.0000 mg | SUBCUTANEOUS | 1 refills | Status: DC

## 2023-07-07 NOTE — Addendum Note (Signed)
 Addended by: Tillman Abide I on: 07/07/2023 04:00 PM   Modules accepted: Orders

## 2023-07-14 DIAGNOSIS — N951 Menopausal and female climacteric states: Secondary | ICD-10-CM | POA: Diagnosis not present

## 2023-07-14 DIAGNOSIS — E063 Autoimmune thyroiditis: Secondary | ICD-10-CM | POA: Diagnosis not present

## 2023-07-16 DIAGNOSIS — E063 Autoimmune thyroiditis: Secondary | ICD-10-CM | POA: Diagnosis not present

## 2023-07-16 DIAGNOSIS — E039 Hypothyroidism, unspecified: Secondary | ICD-10-CM | POA: Diagnosis not present

## 2023-07-16 DIAGNOSIS — N898 Other specified noninflammatory disorders of vagina: Secondary | ICD-10-CM | POA: Diagnosis not present

## 2023-07-16 DIAGNOSIS — N951 Menopausal and female climacteric states: Secondary | ICD-10-CM | POA: Diagnosis not present

## 2023-08-13 ENCOUNTER — Encounter: Payer: Self-pay | Admitting: Internal Medicine

## 2023-08-13 MED ORDER — WEGOVY 1.7 MG/0.75ML ~~LOC~~ SOAJ: 1.7000 mg | SUBCUTANEOUS | 3 refills | Status: DC

## 2023-10-20 ENCOUNTER — Ambulatory Visit: Admitting: Internal Medicine

## 2023-12-12 ENCOUNTER — Other Ambulatory Visit: Payer: Self-pay | Admitting: Internal Medicine

## 2023-12-16 MED ORDER — WEGOVY 1.7 MG/0.75ML ~~LOC~~ SOAJ: 1.7000 mg | SUBCUTANEOUS | 5 refills | Status: AC

## 2023-12-16 NOTE — Addendum Note (Signed)
 Addended by: KALLIE CLOTILDA SQUIBB on: 12/16/2023 10:37 AM   Modules accepted: Orders

## 2023-12-16 NOTE — Telephone Encounter (Signed)
 Spoke to pt. She said she will go up and see how she does.

## 2023-12-31 ENCOUNTER — Ambulatory Visit

## 2024-02-05 DIAGNOSIS — N951 Menopausal and female climacteric states: Secondary | ICD-10-CM | POA: Diagnosis not present

## 2024-02-05 DIAGNOSIS — E039 Hypothyroidism, unspecified: Secondary | ICD-10-CM | POA: Diagnosis not present

## 2024-02-11 DIAGNOSIS — N951 Menopausal and female climacteric states: Secondary | ICD-10-CM | POA: Diagnosis not present

## 2024-02-11 DIAGNOSIS — N898 Other specified noninflammatory disorders of vagina: Secondary | ICD-10-CM | POA: Diagnosis not present

## 2024-02-11 DIAGNOSIS — E039 Hypothyroidism, unspecified: Secondary | ICD-10-CM | POA: Diagnosis not present
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
## 2024-03-16 ENCOUNTER — Ambulatory Visit (INDEPENDENT_AMBULATORY_CARE_PROVIDER_SITE_OTHER): Admitting: Obstetrics and Gynecology

## 2024-03-16 ENCOUNTER — Encounter: Payer: Self-pay | Admitting: Obstetrics and Gynecology

## 2024-03-16 ENCOUNTER — Other Ambulatory Visit (HOSPITAL_COMMUNITY)
Admission: RE | Admit: 2024-03-16 | Discharge: 2024-03-16 | Disposition: A | Source: Ambulatory Visit | Attending: Obstetrics and Gynecology | Admitting: Obstetrics and Gynecology

## 2024-03-16 VITALS — BP 133/78 | HR 75 | Ht 64.0 in | Wt 173.0 lb

## 2024-03-16 DIAGNOSIS — R350 Frequency of micturition: Secondary | ICD-10-CM

## 2024-03-16 DIAGNOSIS — R87610 Atypical squamous cells of undetermined significance on cytologic smear of cervix (ASC-US): Secondary | ICD-10-CM

## 2024-03-16 DIAGNOSIS — E063 Autoimmune thyroiditis: Secondary | ICD-10-CM

## 2024-03-16 DIAGNOSIS — Z124 Encounter for screening for malignant neoplasm of cervix: Secondary | ICD-10-CM

## 2024-03-16 DIAGNOSIS — E039 Hypothyroidism, unspecified: Secondary | ICD-10-CM

## 2024-03-16 DIAGNOSIS — Z1151 Encounter for screening for human papillomavirus (HPV): Secondary | ICD-10-CM | POA: Insufficient documentation

## 2024-03-16 DIAGNOSIS — Z01411 Encounter for gynecological examination (general) (routine) with abnormal findings: Secondary | ICD-10-CM

## 2024-03-16 DIAGNOSIS — Z1211 Encounter for screening for malignant neoplasm of colon: Secondary | ICD-10-CM

## 2024-03-16 DIAGNOSIS — Z1231 Encounter for screening mammogram for malignant neoplasm of breast: Secondary | ICD-10-CM

## 2024-03-16 DIAGNOSIS — N951 Menopausal and female climacteric states: Secondary | ICD-10-CM

## 2024-03-16 DIAGNOSIS — Z01419 Encounter for gynecological examination (general) (routine) without abnormal findings: Secondary | ICD-10-CM

## 2024-03-16 LAB — POCT URINALYSIS DIPSTICK
Bilirubin, UA: NEGATIVE
Blood, UA: NEGATIVE
Glucose, UA: NEGATIVE
Ketones, UA: NEGATIVE
Leukocytes, UA: NEGATIVE
Nitrite, UA: NEGATIVE
Protein, UA: NEGATIVE
Spec Grav, UA: 1.01 (ref 1.010–1.025)
pH, UA: 5 (ref 5.0–8.0)

## 2024-03-16 NOTE — Patient Instructions (Addendum)
 I value your feedback and you entrusting [place] with your care. If you get a [facility] patient survey, I would appreciate you taking the time to let us know about your experience today. Thank you!  [facility] ([place])--[phone]

## 2024-03-17 ENCOUNTER — Ambulatory Visit: Payer: Self-pay | Admitting: Obstetrics and Gynecology

## 2024-03-17 LAB — TSH+FREE T4
Free T4: 0.97 ng/dL (ref 0.82–1.77)
TSH: 5.1 u[IU]/mL — ABNORMAL HIGH (ref 0.450–4.500)

## 2024-03-17 NOTE — Addendum Note (Signed)
 Addended by: WATT HILA B on: 03/17/2024 09:43 AM   Modules accepted: Orders

## 2024-03-23 LAB — CYTOLOGY - PAP
Comment: NEGATIVE
Diagnosis: NEGATIVE
Diagnosis: REACTIVE
High risk HPV: NEGATIVE

## 2024-03-30 ENCOUNTER — Telehealth: Payer: Self-pay

## 2024-03-30 ENCOUNTER — Other Ambulatory Visit (HOSPITAL_COMMUNITY): Payer: Self-pay

## 2024-03-30 NOTE — Telephone Encounter (Signed)
 Pharmacy Patient Advocate Encounter   Received notification from Onbase that prior authorization for Wegovy  is required/requested.   Insurance verification completed.   The patient is insured through [employer] .   Per test claim: Refill too soon. PA is not needed at this time. Medication was filled 03/10/24. Next eligible fill date is 03/31/24.

## 2024-04-15 ENCOUNTER — Encounter: Payer: BC Managed Care – PPO | Admitting: Family

## 2024-04-18 DIAGNOSIS — J069 Acute upper respiratory infection, unspecified: Secondary | ICD-10-CM | POA: Diagnosis not present

## 2024-04-18 DIAGNOSIS — R0981 Nasal congestion: Secondary | ICD-10-CM | POA: Diagnosis not present

## 2024-04-18 DIAGNOSIS — J029 Acute pharyngitis, unspecified: Secondary | ICD-10-CM | POA: Diagnosis not present

## 2024-05-21 ENCOUNTER — Other Ambulatory Visit: Payer: Self-pay | Admitting: Gastroenterology

## 2024-05-25 ENCOUNTER — Encounter: Admitting: Family

## 2024-06-03 ENCOUNTER — Encounter: Payer: Self-pay | Admitting: Family

## 2024-06-03 ENCOUNTER — Ambulatory Visit: Admitting: Family

## 2024-06-03 ENCOUNTER — Ambulatory Visit
Admission: RE | Admit: 2024-06-03 | Discharge: 2024-06-03 | Disposition: A | Source: Ambulatory Visit | Attending: Family

## 2024-06-03 VITALS — BP 122/76 | HR 88 | Temp 98.1°F | Ht 64.0 in | Wt 170.4 lb

## 2024-06-03 DIAGNOSIS — N3946 Mixed incontinence: Secondary | ICD-10-CM

## 2024-06-03 DIAGNOSIS — R1030 Lower abdominal pain, unspecified: Secondary | ICD-10-CM

## 2024-06-03 DIAGNOSIS — E7800 Pure hypercholesterolemia, unspecified: Secondary | ICD-10-CM | POA: Insufficient documentation

## 2024-06-03 DIAGNOSIS — E538 Deficiency of other specified B group vitamins: Secondary | ICD-10-CM | POA: Insufficient documentation

## 2024-06-03 DIAGNOSIS — E663 Overweight: Secondary | ICD-10-CM | POA: Insufficient documentation

## 2024-06-03 DIAGNOSIS — K648 Other hemorrhoids: Secondary | ICD-10-CM

## 2024-06-03 DIAGNOSIS — E559 Vitamin D deficiency, unspecified: Secondary | ICD-10-CM | POA: Insufficient documentation

## 2024-06-03 DIAGNOSIS — Z91018 Allergy to other foods: Secondary | ICD-10-CM | POA: Insufficient documentation

## 2024-06-03 DIAGNOSIS — E063 Autoimmune thyroiditis: Secondary | ICD-10-CM

## 2024-06-03 DIAGNOSIS — G8929 Other chronic pain: Secondary | ICD-10-CM | POA: Insufficient documentation

## 2024-06-03 DIAGNOSIS — K921 Melena: Secondary | ICD-10-CM | POA: Insufficient documentation

## 2024-06-03 DIAGNOSIS — N83202 Unspecified ovarian cyst, left side: Secondary | ICD-10-CM | POA: Insufficient documentation

## 2024-06-03 DIAGNOSIS — N816 Rectocele: Secondary | ICD-10-CM | POA: Insufficient documentation

## 2024-06-03 LAB — POCT URINE DIPSTICK
Bilirubin, UA: NEGATIVE
Blood, UA: NEGATIVE
Glucose, UA: NEGATIVE mg/dL
Ketones, POC UA: NEGATIVE mg/dL
Leukocytes, UA: NEGATIVE
Nitrite, UA: NEGATIVE
POC PROTEIN,UA: NEGATIVE
Spec Grav, UA: 1.01
Urobilinogen, UA: 0.2 U/dL
pH, UA: 6

## 2024-06-03 LAB — CBC WITH DIFFERENTIAL/PLATELET
Basophils Absolute: 0 10*3/uL (ref 0.0–0.1)
Basophils Relative: 0.7 % (ref 0.0–3.0)
Eosinophils Absolute: 0.1 10*3/uL (ref 0.0–0.7)
Eosinophils Relative: 1.4 % (ref 0.0–5.0)
HCT: 38.8 % (ref 36.0–46.0)
Hemoglobin: 13.3 g/dL (ref 12.0–15.0)
Lymphocytes Relative: 23.3 % (ref 12.0–46.0)
Lymphs Abs: 1.1 10*3/uL (ref 0.7–4.0)
MCHC: 34.3 g/dL (ref 30.0–36.0)
MCV: 88.2 fl (ref 78.0–100.0)
Monocytes Absolute: 0.4 10*3/uL (ref 0.1–1.0)
Monocytes Relative: 8.5 % (ref 3.0–12.0)
Neutro Abs: 3.3 10*3/uL (ref 1.4–7.7)
Neutrophils Relative %: 66.1 % (ref 43.0–77.0)
Platelets: 248 10*3/uL (ref 150.0–400.0)
RBC: 4.4 Mil/uL (ref 3.87–5.11)
RDW: 13.6 % (ref 11.5–15.5)
WBC: 4.9 10*3/uL (ref 4.0–10.5)

## 2024-06-03 LAB — TSH: TSH: 0.41 u[IU]/mL (ref 0.35–5.50)

## 2024-06-03 LAB — T4, FREE: Free T4: 0.85 ng/dL (ref 0.60–1.60)

## 2024-06-03 LAB — LIPID PANEL
Cholesterol: 140 mg/dL (ref 28–200)
HDL: 47.1 mg/dL
LDL Cholesterol: 83 mg/dL (ref 10–99)
NonHDL: 93
Total CHOL/HDL Ratio: 3
Triglycerides: 50 mg/dL (ref 10.0–149.0)
VLDL: 10 mg/dL (ref 0.0–40.0)

## 2024-06-03 LAB — B12 AND FOLATE PANEL
Folate: 18.5 ng/mL
Vitamin B-12: 154 pg/mL — ABNORMAL LOW (ref 211–911)

## 2024-06-03 LAB — VITAMIN D 25 HYDROXY (VIT D DEFICIENCY, FRACTURES): VITD: 17.76 ng/mL — ABNORMAL LOW (ref 30.00–100.00)

## 2024-06-03 MED ORDER — CONTRAVE 8-90 MG PO TB12: ORAL_TABLET | ORAL | 0 refills | Status: AC

## 2024-06-03 MED ORDER — IOHEXOL 300 MG/ML  SOLN
85.0000 mL | Freq: Once | INTRAMUSCULAR | Status: AC | PRN
  Administered 2024-06-03: 85 mL via INTRAVENOUS

## 2024-06-03 MED ORDER — EPINEPHRINE 0.3 MG/0.3ML IJ SOAJ: 0.3000 mg | INTRAMUSCULAR | 1 refills | Status: AC | PRN

## 2024-06-03 MED ORDER — IOHEXOL 9 MG/ML PO SOLN
500.0000 mL | ORAL | Status: AC
  Administered 2024-06-03 (×2): 500 mL via ORAL

## 2024-06-03 MED ORDER — HYDROCORTISONE ACETATE 25 MG RE SUPP: 25.0000 mg | Freq: Two times a day (BID) | RECTAL | 0 refills | Status: AC

## 2024-06-03 NOTE — Addendum Note (Signed)
 Addended by: ALBINO SHAVER C on: 06/03/2024 02:01 PM   Modules accepted: Orders

## 2024-06-03 NOTE — Patient Instructions (Addendum)
" °  A referral was placed today for urogynecology for the rectocele as well as to an allergist  Please let us  know if you have not heard back within 2 weeks about the referral.  ------------------------------------  I have sent in your order electronically for the following: transvaginal ultrasound  at this location below. Please call to schedule the appointment at your convenience  [facility] center off [address], [place] [address] Phone [phone]-  8-5 pm    ------------------------------------  Follow up with gastroenterology for your worsening acid reflux    ------------------------------------ I sent in some suppositories for your hemorrhoids as needed.      "
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
## 2024-06-03 NOTE — Addendum Note (Signed)
 Addended by: ISADORA RAISIN on: 06/03/2024 12:48 PM   Modules accepted: Orders
